# Patient Record
Sex: Female | Born: 1945
Health system: Southern US, Community
[De-identification: ages and names within clinical notes are randomized; demographics above are authoritative.]

## PROBLEM LIST (undated history)

## (undated) DIAGNOSIS — E119 Type 2 diabetes mellitus without complications: Secondary | ICD-10-CM

## (undated) DIAGNOSIS — M549 Dorsalgia, unspecified: Secondary | ICD-10-CM

## (undated) DIAGNOSIS — Q268 Other congenital malformations of great veins: Secondary | ICD-10-CM

## (undated) DIAGNOSIS — E7800 Pure hypercholesterolemia, unspecified: Secondary | ICD-10-CM

## (undated) DIAGNOSIS — J45909 Unspecified asthma, uncomplicated: Secondary | ICD-10-CM

## (undated) DIAGNOSIS — Q600 Renal agenesis, unilateral: Secondary | ICD-10-CM

## (undated) DIAGNOSIS — I2510 Atherosclerotic heart disease of native coronary artery without angina pectoris: Secondary | ICD-10-CM

## (undated) DIAGNOSIS — N189 Chronic kidney disease, unspecified: Secondary | ICD-10-CM

## (undated) DIAGNOSIS — K219 Gastro-esophageal reflux disease without esophagitis: Secondary | ICD-10-CM

## (undated) DIAGNOSIS — M2550 Pain in unspecified joint: Secondary | ICD-10-CM

## (undated) DIAGNOSIS — S43006A Unspecified dislocation of unspecified shoulder joint, initial encounter: Secondary | ICD-10-CM

## (undated) DIAGNOSIS — M797 Fibromyalgia: Secondary | ICD-10-CM

## (undated) DIAGNOSIS — E559 Vitamin D deficiency, unspecified: Secondary | ICD-10-CM

## (undated) DIAGNOSIS — I739 Peripheral vascular disease, unspecified: Secondary | ICD-10-CM

## (undated) DIAGNOSIS — IMO0002 Reserved for concepts with insufficient information to code with codable children: Secondary | ICD-10-CM

## (undated) DIAGNOSIS — Q5001 Congenital absence of ovary, unilateral: Secondary | ICD-10-CM

## (undated) DIAGNOSIS — F17200 Nicotine dependence, unspecified, uncomplicated: Secondary | ICD-10-CM

## (undated) DIAGNOSIS — M1990 Unspecified osteoarthritis, unspecified site: Secondary | ICD-10-CM

## (undated) DIAGNOSIS — R0602 Shortness of breath: Secondary | ICD-10-CM

## (undated) DIAGNOSIS — G4730 Sleep apnea, unspecified: Secondary | ICD-10-CM

## (undated) DIAGNOSIS — I82409 Acute embolism and thrombosis of unspecified deep veins of unspecified lower extremity: Secondary | ICD-10-CM

## (undated) DIAGNOSIS — I10 Essential (primary) hypertension: Secondary | ICD-10-CM

## (undated) HISTORY — DX: Atherosclerotic heart disease of native coronary artery without angina pectoris: I25.10

## (undated) HISTORY — PX: EYE SURGERY: SHX253

## (undated) HISTORY — PX: TUBAL LIGATION: SHX77

## (undated) HISTORY — PX: FOOT SURGERY: SHX648

## (undated) HISTORY — DX: Acute embolism and thrombosis of unspecified deep veins of unspecified lower extremity: I82.409

## (undated) HISTORY — PX: CATARACT EXTRACTION, BILATERAL: SHX1313

## (undated) HISTORY — DX: Reserved for concepts with insufficient information to code with codable children: IMO0002

## (undated) HISTORY — PX: OTHER SURGICAL HISTORY: SHX169

## (undated) HISTORY — DX: Type 2 diabetes mellitus without complications: E11.9

## (undated) HISTORY — DX: Other congenital malformations of great veins: Q26.8

## (undated) HISTORY — DX: Chronic kidney disease, unspecified: N18.9

## (undated) HISTORY — DX: Unspecified dislocation of unspecified shoulder joint, initial encounter: S43.006A

## (undated) HISTORY — PX: VEIN BYPASS SURGERY: SHX833

## (undated) HISTORY — DX: Unspecified osteoarthritis, unspecified site: M19.90

## (undated) HISTORY — DX: Shortness of breath: R06.02

## (undated) HISTORY — DX: Dorsalgia, unspecified: M54.9

## (undated) HISTORY — DX: Sleep apnea, unspecified: G47.30

## (undated) HISTORY — DX: Congenital absence of ovary, unilateral: Q50.01

## (undated) HISTORY — DX: Pure hypercholesterolemia, unspecified: E78.00

## (undated) HISTORY — DX: Renal agenesis, unilateral: Q60.0

## (undated) HISTORY — PX: IUD REMOVAL: SHX5392

## (undated) HISTORY — DX: Nicotine dependence, unspecified, uncomplicated: F17.200

## (undated) HISTORY — DX: Pain in unspecified joint: M25.50

## (undated) HISTORY — PX: APPENDECTOMY: SHX54

## (undated) HISTORY — DX: Peripheral vascular disease, unspecified: I73.9

## (undated) HISTORY — DX: Vitamin D deficiency, unspecified: E55.9

## (undated) HISTORY — PX: TOTAL ABDOMINAL HYSTERECTOMY: SHX209

## (undated) HISTORY — DX: Essential (primary) hypertension: I10

---
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
## 1970-09-05 HISTORY — PX: DIAGNOSTIC LAPAROSCOPY: SUR761

## 1973-09-05 HISTORY — PX: TUBAL LIGATION: SHX77

## 1979-09-06 HISTORY — PX: TOTAL ABDOMINAL HYSTERECTOMY: SHX209

## 2000-01-11 ENCOUNTER — Encounter: Admission: RE | Admit: 2000-01-11 | Discharge: 2000-01-11 | Payer: Self-pay | Admitting: *Deleted

## 2000-01-11 ENCOUNTER — Encounter: Payer: Self-pay | Admitting: *Deleted

## 2001-01-12 ENCOUNTER — Ambulatory Visit (HOSPITAL_COMMUNITY): Admission: RE | Admit: 2001-01-12 | Discharge: 2001-01-12 | Payer: Self-pay | Admitting: *Deleted

## 2001-06-01 ENCOUNTER — Other Ambulatory Visit: Admission: RE | Admit: 2001-06-01 | Discharge: 2001-06-01 | Payer: Self-pay | Admitting: *Deleted

## 2001-06-20 ENCOUNTER — Encounter: Admission: RE | Admit: 2001-06-20 | Discharge: 2001-06-20 | Payer: Self-pay | Admitting: *Deleted

## 2001-06-20 ENCOUNTER — Encounter: Payer: Self-pay | Admitting: *Deleted

## 2001-06-25 ENCOUNTER — Encounter: Admission: RE | Admit: 2001-06-25 | Discharge: 2001-09-23 | Payer: Self-pay | Admitting: *Deleted

## 2001-10-25 ENCOUNTER — Encounter: Payer: Self-pay | Admitting: *Deleted

## 2001-10-25 ENCOUNTER — Encounter: Admission: RE | Admit: 2001-10-25 | Discharge: 2001-10-25 | Payer: Self-pay | Admitting: *Deleted

## 2001-10-30 ENCOUNTER — Ambulatory Visit (HOSPITAL_COMMUNITY): Admission: RE | Admit: 2001-10-30 | Discharge: 2001-10-30 | Payer: Self-pay | Admitting: *Deleted

## 2001-10-31 ENCOUNTER — Ambulatory Visit (HOSPITAL_COMMUNITY): Admission: RE | Admit: 2001-10-31 | Discharge: 2001-10-31 | Payer: Self-pay | Admitting: *Deleted

## 2001-10-31 ENCOUNTER — Encounter: Payer: Self-pay | Admitting: *Deleted

## 2001-12-07 ENCOUNTER — Encounter: Payer: Self-pay | Admitting: *Deleted

## 2001-12-07 ENCOUNTER — Encounter: Admission: RE | Admit: 2001-12-07 | Discharge: 2001-12-07 | Payer: Self-pay | Admitting: *Deleted

## 2002-09-05 DIAGNOSIS — I2510 Atherosclerotic heart disease of native coronary artery without angina pectoris: Secondary | ICD-10-CM

## 2002-09-05 HISTORY — PX: ANGIOPLASTY: SHX39

## 2002-09-05 HISTORY — DX: Atherosclerotic heart disease of native coronary artery without angina pectoris: I25.10

## 2002-09-05 HISTORY — PX: CORONARY ANGIOPLASTY: SHX604

## 2003-03-07 ENCOUNTER — Ambulatory Visit (HOSPITAL_COMMUNITY): Admission: RE | Admit: 2003-03-07 | Discharge: 2003-03-07 | Payer: Self-pay | Admitting: Gastroenterology

## 2003-03-07 ENCOUNTER — Encounter (INDEPENDENT_AMBULATORY_CARE_PROVIDER_SITE_OTHER): Payer: Self-pay | Admitting: *Deleted

## 2003-05-19 ENCOUNTER — Encounter: Payer: Self-pay | Admitting: Emergency Medicine

## 2003-05-19 ENCOUNTER — Inpatient Hospital Stay (HOSPITAL_COMMUNITY): Admission: EM | Admit: 2003-05-19 | Discharge: 2003-05-21 | Payer: Self-pay | Admitting: Emergency Medicine

## 2003-06-16 ENCOUNTER — Encounter (HOSPITAL_COMMUNITY): Admission: RE | Admit: 2003-06-16 | Discharge: 2003-09-14 | Payer: Self-pay | Admitting: Cardiology

## 2003-09-15 ENCOUNTER — Encounter (HOSPITAL_COMMUNITY): Admission: RE | Admit: 2003-09-15 | Discharge: 2003-10-21 | Payer: Self-pay | Admitting: Cardiology

## 2003-10-28 ENCOUNTER — Encounter (HOSPITAL_COMMUNITY): Admission: RE | Admit: 2003-10-28 | Discharge: 2004-01-26 | Payer: Self-pay | Admitting: Cardiology

## 2003-12-10 ENCOUNTER — Encounter: Admission: RE | Admit: 2003-12-10 | Discharge: 2003-12-10 | Payer: Self-pay | Admitting: Internal Medicine

## 2004-01-01 ENCOUNTER — Encounter: Admission: RE | Admit: 2004-01-01 | Discharge: 2004-01-01 | Payer: Self-pay | Admitting: Cardiology

## 2004-02-04 ENCOUNTER — Encounter (HOSPITAL_COMMUNITY): Admission: RE | Admit: 2004-02-04 | Discharge: 2004-05-04 | Payer: Self-pay | Admitting: Cardiology

## 2004-05-06 ENCOUNTER — Encounter (HOSPITAL_COMMUNITY): Admission: RE | Admit: 2004-05-06 | Discharge: 2004-08-04 | Payer: Self-pay | Admitting: Cardiology

## 2004-08-05 ENCOUNTER — Encounter (HOSPITAL_COMMUNITY): Admission: RE | Admit: 2004-08-05 | Discharge: 2004-11-03 | Payer: Self-pay | Admitting: Cardiology

## 2004-11-18 ENCOUNTER — Encounter: Admission: RE | Admit: 2004-11-18 | Discharge: 2004-11-18 | Payer: Self-pay | Admitting: Cardiology

## 2004-12-16 ENCOUNTER — Encounter: Admission: RE | Admit: 2004-12-16 | Discharge: 2004-12-16 | Payer: Self-pay | Admitting: Cardiology

## 2005-03-22 ENCOUNTER — Encounter: Admission: RE | Admit: 2005-03-22 | Discharge: 2005-03-22 | Payer: Self-pay | Admitting: Family Medicine

## 2005-05-17 ENCOUNTER — Ambulatory Visit (HOSPITAL_BASED_OUTPATIENT_CLINIC_OR_DEPARTMENT_OTHER): Admission: RE | Admit: 2005-05-17 | Discharge: 2005-05-17 | Payer: Self-pay | Admitting: Otolaryngology

## 2005-05-29 ENCOUNTER — Ambulatory Visit: Payer: Self-pay | Admitting: Internal Medicine

## 2006-06-15 ENCOUNTER — Encounter: Admission: RE | Admit: 2006-06-15 | Discharge: 2006-06-15 | Payer: Self-pay | Admitting: Family Medicine

## 2006-10-09 ENCOUNTER — Encounter: Admission: RE | Admit: 2006-10-09 | Discharge: 2006-10-09 | Payer: Self-pay | Admitting: Family Medicine

## 2007-08-28 ENCOUNTER — Encounter: Admission: RE | Admit: 2007-08-28 | Discharge: 2007-08-28 | Payer: Self-pay | Admitting: Family Medicine

## 2008-09-02 ENCOUNTER — Encounter: Admission: RE | Admit: 2008-09-02 | Discharge: 2008-09-02 | Payer: Self-pay | Admitting: Family Medicine

## 2009-11-30 ENCOUNTER — Encounter: Admission: RE | Admit: 2009-11-30 | Discharge: 2009-11-30 | Payer: Self-pay | Admitting: Family Medicine

## 2010-06-22 ENCOUNTER — Ambulatory Visit: Payer: Self-pay | Admitting: Cardiology

## 2010-09-25 ENCOUNTER — Encounter: Payer: Self-pay | Admitting: Diagnostic Radiology

## 2010-09-26 ENCOUNTER — Encounter: Payer: Self-pay | Admitting: Cardiology

## 2010-09-26 ENCOUNTER — Encounter: Payer: Self-pay | Admitting: Interventional Radiology

## 2010-11-23 ENCOUNTER — Other Ambulatory Visit: Payer: Self-pay | Admitting: Family Medicine

## 2010-11-23 DIAGNOSIS — Z1231 Encounter for screening mammogram for malignant neoplasm of breast: Secondary | ICD-10-CM

## 2010-12-02 ENCOUNTER — Ambulatory Visit
Admission: RE | Admit: 2010-12-02 | Discharge: 2010-12-02 | Disposition: A | Discharge status: Home / Self Care | Payer: Medicare Other | Source: Ambulatory Visit | Attending: Family Medicine | Admitting: Family Medicine

## 2010-12-02 DIAGNOSIS — Z1231 Encounter for screening mammogram for malignant neoplasm of breast: Secondary | ICD-10-CM

## 2010-12-14 ENCOUNTER — Other Ambulatory Visit: Payer: Self-pay | Admitting: Orthopedic Surgery

## 2010-12-14 DIAGNOSIS — R52 Pain, unspecified: Secondary | ICD-10-CM

## 2010-12-15 ENCOUNTER — Ambulatory Visit
Admission: RE | Admit: 2010-12-15 | Discharge: 2010-12-15 | Disposition: A | Discharge status: Home / Self Care | Payer: Medicare Other | Source: Ambulatory Visit | Attending: Orthopedic Surgery | Admitting: Orthopedic Surgery

## 2010-12-15 DIAGNOSIS — R52 Pain, unspecified: Secondary | ICD-10-CM

## 2010-12-24 ENCOUNTER — Other Ambulatory Visit: Payer: Self-pay | Admitting: *Deleted

## 2010-12-24 DIAGNOSIS — E7800 Pure hypercholesterolemia, unspecified: Secondary | ICD-10-CM

## 2010-12-25 ENCOUNTER — Encounter: Payer: Self-pay | Admitting: Cardiology

## 2010-12-25 DIAGNOSIS — I2510 Atherosclerotic heart disease of native coronary artery without angina pectoris: Secondary | ICD-10-CM | POA: Insufficient documentation

## 2010-12-25 DIAGNOSIS — F17200 Nicotine dependence, unspecified, uncomplicated: Secondary | ICD-10-CM | POA: Insufficient documentation

## 2010-12-25 DIAGNOSIS — G4730 Sleep apnea, unspecified: Secondary | ICD-10-CM | POA: Insufficient documentation

## 2010-12-25 DIAGNOSIS — Q600 Renal agenesis, unilateral: Secondary | ICD-10-CM | POA: Insufficient documentation

## 2010-12-25 DIAGNOSIS — E7849 Other hyperlipidemia: Secondary | ICD-10-CM | POA: Insufficient documentation

## 2010-12-29 ENCOUNTER — Ambulatory Visit (INDEPENDENT_AMBULATORY_CARE_PROVIDER_SITE_OTHER): Payer: Medicare Other | Admitting: Cardiology

## 2010-12-29 ENCOUNTER — Other Ambulatory Visit (INDEPENDENT_AMBULATORY_CARE_PROVIDER_SITE_OTHER): Payer: Medicare Other | Admitting: *Deleted

## 2010-12-29 ENCOUNTER — Encounter: Payer: Self-pay | Admitting: Cardiology

## 2010-12-29 DIAGNOSIS — E7800 Pure hypercholesterolemia, unspecified: Secondary | ICD-10-CM

## 2010-12-29 DIAGNOSIS — F17200 Nicotine dependence, unspecified, uncomplicated: Secondary | ICD-10-CM

## 2010-12-29 DIAGNOSIS — I2510 Atherosclerotic heart disease of native coronary artery without angina pectoris: Secondary | ICD-10-CM

## 2010-12-29 LAB — HEPATIC FUNCTION PANEL
Albumin: 3.8 g/dL (ref 3.5–5.2)
Bilirubin, Direct: 0.1 mg/dL (ref 0.0–0.3)
Total Bilirubin: 0.7 mg/dL (ref 0.3–1.2)

## 2010-12-29 LAB — BASIC METABOLIC PANEL
CO2: 29 mEq/L (ref 19–32)
Creatinine, Ser: 0.9 mg/dL (ref 0.4–1.2)
GFR: 70.86 mL/min (ref >60.00)
Glucose, Bld: 99 mg/dL (ref 70–99)
Potassium: 4.1 mEq/L (ref 3.5–5.1)
Sodium: 143 mEq/L (ref 135–145)

## 2010-12-29 LAB — LIPID PANEL
LDL Cholesterol: 120 mg/dL — ABNORMAL HIGH (ref 0–99)
Triglycerides: 155 mg/dL — ABNORMAL HIGH (ref 0.0–149.0)
VLDL: 31 mg/dL (ref 0.0–40.0)

## 2010-12-29 NOTE — Assessment & Plan Note (Signed)
She has not been compliant with her statin therapy. We will followup on her blood work today. Her goal LDL is 70. I am encouraged with her weight loss.

## 2010-12-29 NOTE — Assessment & Plan Note (Signed)
She is asymptomatic. Her last stress Cardiolite study was in March of 2010 and was normal. We will continue risk factor modification.

## 2010-12-29 NOTE — Progress Notes (Signed)
   Sandra [name] Date of Birth: 06-02-1946   History of Present Illness: Sandra [name] is seen for followup today. She has been doing very well from a cardiac standpoint without any symptoms of chest pain, shortness of breath, or palpitations. She does note one episode where she became very dizzy while lying in bed. This resolved and she has had no further problems. She is exercising regularly with the help of a personal trainer. She does continue to smoke but realizes she needs to quit.  Current Outpatient Prescriptions on File Prior to Visit  Medication Sig Dispense Refill  . aspirin 325 MG tablet Take 325 mg by mouth daily.        Marland Kitchen CALCIUM PO Take by mouth daily.        Marland Kitchen Fexofenadine HCl (ALLEGRA PO) Take by mouth daily.        Marland Kitchen guaiFENesin (MUCINEX) 600 MG 12 hr tablet Take 600 mg by mouth daily.        . metoprolol (TOPROL-XL) 50 MG 24 hr tablet Take 25 mg by mouth daily.        . Multiple Vitamin (MULTIVITAMIN) tablet Take 1 tablet by mouth daily.        . rosuvastatin (CRESTOR) 5 MG tablet Take 5 mg by mouth daily.        Marland Kitchen VITAMIN D, CHOLECALCIFEROL, PO Take 3,000 mg by mouth daily.        . Cetirizine HCl (ZYRTEC PO) Take by mouth daily.          Allergies  Allergen Reactions  . Erythromycin     Past Medical History  Diagnosis Date  . Coronary artery disease 2004    STATUS POST STENTING OF THE RIGHT CORONARY OSTIUM   . Hypercholesterolemia   . Tobacco dependence   . Sleep apnea   . Congenital absence of left kidney   . Dislocated shoulder     left    Past Surgical History  Procedure Date  . Total abdominal hysterectomy   . Bone spur shoulder   . Tubal ligation     History  Smoking status  . Current Everyday Smoker -- 1.0 packs/day  Smokeless tobacco  . Not on file    History  Alcohol Use No    Family History  Problem Relation Age of Onset  . Leukemia Sister   . Hypertension Brother     Review of Systems: The review of systems is positive for she  had a recent fall in her yard and dislocated her left shoulder. She is scheduled to see orthopedic surgery tomorrow.  All other systems were reviewed and are negative.  Physical Exam: BP 140/82  Pulse 82  Ht 5\' 3"  (1.6 m)  Wt 171 lb 9.6 oz (77.837 kg)  BMI 30.40 kg/m2 He is an obese white female in no acute distress. HEENT exam is unremarkable. She has no JVD, adenopathy, thyromegaly, or bruits. Lungs are clear. Cardiac exam reveals a regular rate and rhythm without gallop, murmur, or click. Abdomen is obese, soft, nontender. She has no masses or bruits. Extremities are without edema. Pedal pulses are palpable. She is alert and oriented x3. Cranial nerves II through XII are intact. LABORATORY DATA:   Assessment / Plan:

## 2010-12-29 NOTE — Patient Instructions (Signed)
We will call with the results of your blood work today.  Continue your current medications.  Continue your efforts at weight loss and exercise.  We will see you back in 6 months.  Stop smoking!!

## 2010-12-29 NOTE — Assessment & Plan Note (Signed)
We discussed smoking cessation strategies. She  is interested in using Zyban when she decides to quit. She is to call as when she is ready to fill his prescription and we will send it in to the pharmacy.

## 2010-12-30 ENCOUNTER — Telehealth: Payer: Self-pay | Admitting: *Deleted

## 2010-12-30 ENCOUNTER — Other Ambulatory Visit: Payer: Self-pay | Admitting: *Deleted

## 2010-12-30 DIAGNOSIS — E7800 Pure hypercholesterolemia, unspecified: Secondary | ICD-10-CM

## 2010-12-30 NOTE — Telephone Encounter (Signed)
Notified of lab results. States she has not been taking Crestor 5 mg regularly. Has not taken in about 2 wks. Per Dr. Swaziland advises to get back on Crestor regularly. Will recheck labs in 3 mo. Also advised to watch diet closer.

## 2010-12-30 NOTE — Telephone Encounter (Signed)
Message copied by Murrell Redden on Thu Dec 30, 2010  2:50 PM ------      Message from: Sw[place]and, PETER      Created: Wed Dec 29, 2010  7:30 PM       Chemistries are normal. LDL not at goal <70. Would recommend she go back on statin. If she cant tolerate statin could try Zetia.

## 2011-01-21 NOTE — Discharge Summary (Signed)
NAMESURAYA, [name]                          ACCOUNT NO.:  [id number]   MEDICAL RECORD NO.:  [id number]                   PATIENT TYPE:  INP   LOCATION:  6531                                 FACILITY:  [facility]   PHYSICIAN:  Peter M. Swaziland, M.D.               DATE OF BIRTH:  1946-05-13   DATE OF ADMISSION:  05/19/2003  DATE OF DISCHARGE:  05/21/2003                                 DISCHARGE SUMMARY   HISTORY OF PRESENT ILLNESS:  This is a 65 year old white female with a  history of tobacco abuse and hypercholesterolemia who presented with  symptoms of classic unstable angina. She described mid substernal chest pain  pressure associated with nausea, clammy sweats, shortness of breath. Her  pain radiated into her neck and jaw bilaterally and into both shoulders.  Symptoms were occurring at rest and lasting up to 30 minutes at a time. She  was admitted for further evaluation. For details of the past medical  history, social history, family history, and physical exam, please see  admission history and physical.   LABORATORY DATA:  A pH is 7.39, pCO2 of 42, bicarb 25. White count 9,000,  hemoglobin 13, hematocrit 38.8, platelets 185,000. Coags were normal. Sodium  141, potassium 3.9, chloride 109, CO2 27, BUN 12, creatinine 0.9, glucose of  91. Initial CK was 92 with 2.3 MB. Troponin was 0.08. Repeat CPK was  identical as was the troponin. Cholesterol was 186, triglycerides 178, LDL  of 108, HDL of 42. Point of care cardiac enzyme testing was negative x3.  Initial ECG showed normal sinus rhythm with normal ECG. Chest x-ray showed  no active disease.   HOSPITAL COURSE:  The patient was admitted to telemetry. She had mildly  positive troponins as noted. Her ECG remained stable. She was treated with  aspirin, subcu Lovenox, and beta blocker. She had no recurrent chest pain.  She was counseled on smoking cessation. She was begun on statin therapy. On  May 20, 2003, she underwent  cardiac catheterization. This demonstrated  normal appearing left coronary artery. The right coronary artery had a 99%  ostial stenosis. Left ventricular function was normal with an ejection  fraction of 60%. The patient underwent intervention of the ostial right  coronary lesion, initially dilating with Cutting balloon, then deploying a  2.5 x 13 mm Cypher drug-eluding stent. This was post dilated to 16  atmospheres with an excellent angiographic result, 0% residual stenosis, and  TIMI grade III flow. The patient was treated during and post procedure with  IV Integrilin. She was begun on aspirin, Plavix, continued on beta blockade.  She did well without any recurrent chest pain. Her ECG remained stable. Her  blood counts remained normal. She was discharged home the following day in  stable condition.   DISCHARGE DIAGNOSES:  1. Unstable angina pectoris.  2. Single-vessel obstructive atherosclerotic coronary artery disease     involving  the ostium of the right coronary artery. Status post successful     stenting.  3. Hypercholesterolemia.  4. Tobacco abuse.   DISCHARGE MEDICATIONS:  1. Coated aspirin 325 mg daily.  2. Plavix 75 mg daily.  3. Toprol-XL 50 mg per day.  4. Lipitor 10 mg per day.  5. Wellbutrin XR 150 mg per day.  6. Nitroglycerin 1/150 sublingual p.r.n.   ACTIVITY:  The patient is to avoid lifting or straining for the next five  days. She was to gradually increase her walking and to follow a low fat  diet. I counseled her on smoking cessation.   FOLLOW UP:  She will follow with Dr. Swaziland in two weeks.   DISCHARGE STATUS:  Improved.                                                Peter M. Swaziland, M.D.    PMJ/MEDQ  D:  05/21/2003  T:  05/21/2003  Job:  [id number]   cc:   Wilson Singer, M.D.  [address]  [place]  [place] [address]  Fax: [phone]

## 2011-01-21 NOTE — H&P (Signed)
NAME:  Sandra [name], Sandra [name]                          ACCOUNT NO.:  [id number]   MEDICAL RECORD NO.:  [id number]                   PATIENT TYPE:  EMS   LOCATION:  MAJO                                 FACILITY:  [facility]   PHYSICIAN:  Peter M. Swaziland, M.D.               DATE OF BIRTH:  July 28, 1946   DATE OF ADMISSION:  05/19/2003  DATE OF DISCHARGE:                                HISTORY & PHYSICAL   HISTORY OF PRESENT ILLNESS:  The patient is a 65 year old white female with  a history of tobacco abuse, hypercholesterolemia who presents with  complaints of chest pain.  She states she began having episodes of chest  pain Thursday. This was not related to exertion, but she has been under some  increased stress recently. She describes her pain as a substernal chest  pressure, associated with nausea, clammy sweats, and shortness of breath.  The pain radiates up into her neck and jaw bilaterally and feels like a  toothache.  She has also had radiation of pain into both shoulders as an  ache.  The pain has increased in frequency and severity since Thursday with  some episodes lasting up to 30 minutes. She is currently pain-free.   PAST MEDICAL HISTORY:  Significant for hypercholesterolemia.  She has had a  previous hysterectomy.  She has congenital absence of her left kidney. She  has a history of superficial thrombophlebitis.   MEDICATIONS:  Multivitamin daily and vitamin E daily.  She had previously  been taking Lipitor and Premarin, but has been off of these for several  months.   ALLERGIES:  ERYTHROMYCIN.   FAMILY HISTORY:  Father died of heart disease and renal disease.  Mother is  alive and well.  One brother has hypertension. One sister died with  leukemia.   SOCIAL HISTORY:  The patient works for [employer], Investment banker, corporate work. She smokes one pack per day.  She is married  and has two children.   REVIEW OF SYSTEMS:  Otherwise negative.   PHYSICAL  EXAMINATION:  GENERAL: The patient is a pleasant white female in no  acute distress.  VITAL SIGNS:  Blood pressure 160/84, pulse 70, respirations 20.  She is  afebrile.  HEENT:  Pupils equal, round, and reactive to light and accommodation.  Extraocular movements are full.  Oropharynx is clear.  NECK:  Supple without JVD, adenopathy, thyromegaly, or bruits.  LUNGS:  Clear to auscultation and percussion.  HEART:  Regular rate and rhythm.  Normal S1 and S2 without murmurs, rubs, or  gallops or clicks.  ABDOMEN:  Soft and nontender.  Bowel sounds are positive.  No  hepatosplenomegaly, masses, or bruits.  EXTREMITIES:  Without edema.  Pulses are 2+ and symmetric. There is no  evidence of phlebitis.  NEUROLOGY:  Intact.   LABORATORY DATA:  Initial EKG is normal.  Chest x-ray is normal.  Initial  point of care cardiac enzymes are normal.   IMPRESSION:  1. Unstable angina pectoris with a classic history.  2. Tobacco abuse.  3. Hypercholesterolemia.   PLAN:  The patient will be admitted and ruled out for myocardial infarction.  She will be treated with aspirin, subcu Lovenox, and started on oral beta  blockade.  Reassess her lipid status.  Will plan on cardiac catheterization  in the morning.                                                Peter M. Swaziland, M.D.    PMJ/MEDQ  D:  05/19/2003  T:  05/19/2003  Job:  [id number]   cc:   Wilson Singer, M.D.  [address]  [place]  [place] [address]  Fax: [phone]

## 2011-01-21 NOTE — Procedures (Signed)
NAMEAELYN, STANALAND                ACCOUNT NO.:  [id number]   MEDICAL RECORD NO.:  [id number]          PATIENT TYPE:  OUT   LOCATION:  SLEEP CENTER                 FACILITY:  [facility]   PHYSICIAN:  Clinton D. Maple Hudson, M.D. DATE OF BIRTH:  1945-09-27   DATE OF STUDY:  05/17/2005                              NOCTURNAL POLYSOMNOGRAM   REFERRING PHYSICIAN:  Dr. Flo Shanks.   DATE OF STUDY:  May 17, 2005.   INDICATION FOR STUDY:  Hypersomnia with sleep apnea. Epworth sleepiness  score 13/24, BMI 30. Weight is 175 pounds.   SLEEP ARCHITECTURE:  Total sleep time 352 minutes with sleep efficiency 89%.  Stage I was 6%, stage II 62%, stages III and IV 17%, REM 15% of total sleep  time. Sleep latency 16 minutes, REM latency 57 minutes, awake after sleep  onset 25 minutes, arousal index increased at 15 per hour. No bedtime  medication was taken.   RESPIRATORY DATA:  Apnea/hypopnea index (AHI, RDI) 27.6 obstructive events  per hour indicating moderate obstructive sleep apnea/hypopnea syndrome. This  included 42 obstructive apneas and 120 hypopneas. Events were not positional  but more were noted while she was sleeping on her left side later at night.  REM AHI of 72.7 per hour reflecting significant predominance of apneas  during REM which occurred after 4 a.m. There was insufficient sleep  disordered breathing in the first few hours at night to permit CPAP  titration by split protocol.   OXYGEN DATA:  Moderate snoring with oxygen desaturation to a nadir of 80%.  Mean oxygen saturation through the study was 91% on room air.   CARDIAC DATA:  Normal sinus rhythm.   MOVEMENT/PARASOMNIA:  A total 161 limb jerks were reported which 32 were  associated with arousal or awakening for periodic limb movement with arousal  index of 5.4 per hour which is mildly increased.   IMPRESSION/RECOMMENDATIONS:  1.  Moderate obstructive sleep apnea/hypopnea syndrome, apnea/hypopnea index      of 27.6 per  hour with moderate snoring and oxygen desaturation to 81%.  2.  Events were not positional but strongly associated with REM which      developed quite late in the study night.      Consider return for CPAP titration or evaluate for alternative therapies      as appropriate.  3.  Periodic limb movement with arousal, 5.4 per hour.      Clinton D. Maple Hudson, M.D.  Diplomate, Biomedical engineer of Sleep Medicine  Electronically Signed     CDY/MEDQ  D:  05/29/2005 08:40:43  T:  05/29/2005 14:12:10  Job:  [id number]

## 2011-03-26 ENCOUNTER — Other Ambulatory Visit: Payer: Self-pay | Admitting: Cardiology

## 2011-03-28 NOTE — Telephone Encounter (Signed)
escribe medication per fax request  

## 2011-07-12 ENCOUNTER — Encounter: Payer: Self-pay | Admitting: Cardiology

## 2011-07-14 ENCOUNTER — Telehealth: Payer: Self-pay | Admitting: Cardiology

## 2011-07-14 NOTE — Telephone Encounter (Signed)
New Problem: Per [place] Dec calling aware that Dr. Swaziland is off today.  Please call to discuss care of patient to [facility] cell phone is noted

## 2011-07-14 NOTE — Telephone Encounter (Signed)
Sandra [name] called from [facility] orth center. He is a PA. States 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 They are referring to Vascular surgeon but wanted Dr. Swaziland to be aware. Will fax notes to [name].

## 2011-07-15 ENCOUNTER — Encounter: Payer: Self-pay | Admitting: Vascular Surgery

## 2011-08-02 ENCOUNTER — Encounter: Payer: Self-pay | Admitting: Vascular Surgery

## 2011-08-03 ENCOUNTER — Ambulatory Visit (INDEPENDENT_AMBULATORY_CARE_PROVIDER_SITE_OTHER): Payer: Medicare Other | Admitting: Vascular Surgery

## 2011-08-03 ENCOUNTER — Other Ambulatory Visit: Payer: Medicare Other

## 2011-08-03 ENCOUNTER — Other Ambulatory Visit (INDEPENDENT_AMBULATORY_CARE_PROVIDER_SITE_OTHER): Payer: Medicare Other

## 2011-08-03 ENCOUNTER — Encounter: Payer: Self-pay | Admitting: Vascular Surgery

## 2011-08-03 VITALS — BP 165/77 | HR 88 | Resp 16 | Ht 63.0 in | Wt 182.0 lb

## 2011-08-03 DIAGNOSIS — I6529 Occlusion and stenosis of unspecified carotid artery: Secondary | ICD-10-CM

## 2011-08-03 DIAGNOSIS — I82220 Acute embolism and thrombosis of inferior vena cava: Secondary | ICD-10-CM

## 2011-08-03 NOTE — Progress Notes (Signed)
[facility] [place]  Patient name: Sandra [name] MRN: [id number] DOB: [birth year] Sex: female  CC: absence of inferior vena cava. Referred by Dr. Sherlean Foot  HPI: Sandra [name] is a 65 y.o. female who was undergoing a workup for chronic low back pain. This prompted an MRI of the lumbar spine which was done at [facility] radiology. This showed evidence of degenerative lumbar spondylosis with multilevel disc disease and facet disease. 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 He was sent for vascular consultation.  The patient does describe some aching pain in her legs which is aggravated by prolonged standing and sitting. She also states that she has restless leg syndrome. He's also had varicose veins for many years. She's had no history of DVT. She did have one episode of superficial thrombophlebitis in the past. She is not notes significant problems with swelling in her legs in general.  Of note she has had multiple other anomalies noted she states. She is missing a kidney. She has only one ovary.  Past Medical History  Diagnosis Date  . Coronary artery disease 2004    STATUS POST STENTING OF THE RIGHT CORONARY OSTIUM   . Hypercholesterolemia   . Tobacco dependence   . Sleep apnea   . Congenital absence of left kidney   . Dislocated shoulder     left  . DDD (degenerative disc disease)     Family History  Problem Relation Age of Onset  . Leukemia Sister   . Hypertension Brother     SOCIAL HISTORY: History  Substance Use Topics  . Smoking status: Current Everyday Smoker -- 1.0 packs/day  . Smokeless tobacco: Not on file  . Alcohol Use: No    Allergies  Allergen Reactions  . Erythromycin     Current Outpatient Prescriptions  Medication Sig Dispense Refill  . aspirin 325 MG tablet Take 325 mg by mouth daily.        Marland Kitchen CALCIUM PO Take by mouth daily.        . CRESTOR 5 MG tablet TAKE 1 TABLET EVERY DAY  30  tablet  5  . guaiFENesin (MUCINEX) 600 MG 12 hr tablet Take 600 mg by mouth daily.        Marland Kitchen ibuprofen (ADVIL,MOTRIN) 100 MG tablet Take 100 mg by mouth every 6 (six) hours as needed.        . metoprolol (TOPROL-XL) 50 MG 24 hr tablet Take 25 mg by mouth daily.        . Multiple Vitamin (MULTIVITAMIN) tablet Take 1 tablet by mouth daily.        . Turmeric 500 MG CAPS Take 500 mg by mouth 2 (two) times daily as needed.        Marland Kitchen VITAMIN D, CHOLECALCIFEROL, PO Take 3,000 mg by mouth daily.        . Cetirizine HCl (ZYRTEC PO) Take by mouth daily.        Marland Kitchen Fexofenadine HCl (ALLEGRA PO) Take by mouth daily.          REVIEW OF SYSTEMS: Arly.Keller ] denotes positive finding; [  ] denotes negative finding CARDIOVASCULAR:  [ ]  chest pain   [ ]  chest pressure   [ ]  palpitations   [ ]  orthopnea   Arly.Keller ] dyspnea on exertion   [ ]  claudication   [ ]  rest pain   [ ]  DVT   [ ]  phlebitis PULMONARY:   Arly.Keller ]  productive cough   [ ]  asthma   [ ]  wheezing NEUROLOGIC:   [ ]  weakness  [ ]  paresthesias  [ ]  aphasia  [ ]  amaurosis  [ ]  dizziness HEMATOLOGIC:   [ ]  bleeding problems   [ ]  clotting disorders MUSCULOSKELETAL:  [ ]  joint pain   [ ]  joint swelling [ ]  leg swelling GASTROINTESTINAL: [ ]   blood in stool  [ ]   hematemesis GENITOURINARY:  [ ]   dysuria  [ ]   hematuria PSYCHIATRIC:  [ ]  history of major depression INTEGUMENTARY:  [ ]  rashes  [ ]  ulcers CONSTITUTIONAL:  [ ]  fever   [ ]  chills  PHYSICAL EXAM: Filed Vitals:   08/03/11 1005  BP: 165/77  Pulse: 88  Resp: 16  Height: 5\' 3"  (1.6 m)  Weight: 182 lb (82.555 kg)  SpO2: 94%   Body mass index is 32.24 kg/(m^2). GENERAL: The patient is a well-nourished female, in no acute distress. The vital signs are documented above. CARDIOVASCULAR: There is a regular rate and rhythm without significant murmur appreciated. She has a right carotid bruit. She has palpable femoral pulses and palpable dorsalis pedis pulses bilaterally. His mild bilateral artery  swelling. PULMONARY: There is good air exchange bilaterally without wheezing or rales. ABDOMEN: Soft and non-tender with normal pitched bowel sounds. I cannot palpate an abdominal aortic aneurysm. MUSCULOSKELETAL: There are no major deformities or cyanosis. NEUROLOGIC: No focal weakness or paresthesias are detected. SKIN: There are no ulcers or rashes noted. She does have varicose veins bilaterally. PSYCHIATRIC: The patient has a normal affect.  DATA:  1. Given that she has a right carotid bruit I did obtain a carotid duplex scan today. This shows no evidence of significant carotid disease. 2. Did review her MRI of the spine and agreed that she appears to have absence of the IVC. His multiple large collaterals. This is clearly a chronic finding.  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 I think this does put her at slightly increased risk for DVT in the future although she's had no history of DVT up until now. This reason I would not recommend Coumadin for chronic anticoagulation given the risk of bleeding. Have discussed the importance of DVT prophylaxis. Clearly if she had any major surgery this would be important consideration. Also discussed the importance of remaining active especially when she is traveling. He is also considering having work done on her varicose veins and I would think she would be at high risk for recurrence because of this. I'll be happy to see her back at any time if any new vascular issues arise.  Shawnya Mayor S [facility] [place] Office: [phone] ;

## 2011-08-10 NOTE — Procedures (Unsigned)
CAROTID DUPLEX EXAM  INDICATION:  Right carotid bruit on physical exam.  HISTORY: Diabetes:  No. Cardiac:  Previous PTCA. Hypertension:  Yes. Smoking:  Currently. Previous Surgery: CV History: Amaurosis Fugax No, Paresthesias No, Hemiparesis No.                                      RIGHT             LEFT Brachial systolic pressure:         178               180 Brachial Doppler waveforms:         Triphasic         Triphasic Vertebral direction of flow:        Antegrade         Antegrade DUPLEX VELOCITIES (cm/sec) CCA peak systolic                   101               74 ECA peak systolic                   164               110 ICA peak systolic                   106               80 ICA end diastolic                   36                30 PLAQUE MORPHOLOGY:                  Inhomogeneous     Inhomogeneous PLAQUE AMOUNT:                      Mild              Mild PLAQUE LOCATION:                    CCA, ICA, ECA     CCA, ICA.  DUPLEX IMAGING:  There is a low bifurcation noted on the left with a very tortuous internal carotid artery.  IMPRESSION: 1. 20% to 39% bilateral internal carotid artery stenosis. 2. Mild right external carotid artery stenosis.  ___________________________________________ Di Kindle. Edilia Bo, M.D.  CI/MEDQ  D:  08/03/2011  T:  08/03/2011  Job:  [id number]

## 2011-09-06 DIAGNOSIS — Q268 Other congenital malformations of great veins: Secondary | ICD-10-CM

## 2011-09-06 HISTORY — DX: Other congenital malformations of great veins: Q26.8

## 2011-10-13 ENCOUNTER — Encounter: Payer: Self-pay | Admitting: Cardiology

## 2011-10-13 ENCOUNTER — Ambulatory Visit (INDEPENDENT_AMBULATORY_CARE_PROVIDER_SITE_OTHER): Payer: Medicare Other | Admitting: Cardiology

## 2011-10-13 VITALS — BP 158/82 | HR 80 | Ht 63.0 in | Wt 185.6 lb

## 2011-10-13 DIAGNOSIS — E785 Hyperlipidemia, unspecified: Secondary | ICD-10-CM

## 2011-10-13 DIAGNOSIS — Z720 Tobacco use: Secondary | ICD-10-CM

## 2011-10-13 DIAGNOSIS — F17200 Nicotine dependence, unspecified, uncomplicated: Secondary | ICD-10-CM

## 2011-10-13 DIAGNOSIS — I2510 Atherosclerotic heart disease of native coronary artery without angina pectoris: Secondary | ICD-10-CM

## 2011-10-13 DIAGNOSIS — I871 Compression of vein: Secondary | ICD-10-CM | POA: Insufficient documentation

## 2011-10-13 DIAGNOSIS — I10 Essential (primary) hypertension: Secondary | ICD-10-CM

## 2011-10-13 NOTE — Progress Notes (Signed)
   Sandra [name] Date of Birth: [birth year]   History of Present Illness: Sandra [name] is seen for followup today. She has been doing very well from a cardiac standpoint. She has had very rare fleeting symptoms of chest tightness. She does note symptoms of shortness of breath and is now ready to quit smoking. 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 She was evaluated by Dr. Edilia Bo in the vascular surgery office and no further treatment or workup was indicated. It is noteworthy that she also has congenital absence of her left kidney and left ovary and fallopian tube.  Current Outpatient Prescriptions on File Prior to Visit  Medication Sig Dispense Refill  . aspirin 325 MG tablet Take 325 mg by mouth daily.        Marland Kitchen CALCIUM PO Take by mouth daily.        . CRESTOR 5 MG tablet TAKE 1 TABLET EVERY DAY  30 tablet  5  . guaiFENesin (MUCINEX) 600 MG 12 hr tablet Take 600 mg by mouth daily.        Marland Kitchen ibuprofen (ADVIL,MOTRIN) 100 MG tablet Take 100 mg by mouth every 6 (six) hours as needed.        . metoprolol (TOPROL-XL) 50 MG 24 hr tablet Take 25 mg by mouth daily.       . Multiple Vitamin (MULTIVITAMIN) tablet Take 1 tablet by mouth daily.        . Turmeric 500 MG CAPS Take 500 mg by mouth 2 (two) times daily as needed.        Marland Kitchen VITAMIN D, CHOLECALCIFEROL, PO Take 3,000 mg by mouth daily.          Allergies  Allergen Reactions  . Erythromycin   . Epinephrine Palpitations    Past Medical History  Diagnosis Date  . Coronary artery disease 2004    STATUS POST STENTING OF THE RIGHT CORONARY OSTIUM   . Hypercholesterolemia   . Tobacco dependence   . Sleep apnea   . Congenital absence of left kidney   . Dislocated shoulder     left  . DDD (degenerative disc disease)     Past Surgical History  Procedure Date  . Total abdominal hysterectomy   . Bone spur shoulder   . Tubal ligation     History  Smoking status  . Current Everyday Smoker -- 1.0 packs/day    Smokeless tobacco  . Not on file    History  Alcohol Use No    Family History  Problem Relation Age of Onset  . Leukemia Sister   . Hypertension Brother     Review of Systems: As noted in history of present illness.  All other systems were reviewed and are negative.  Physical Exam: BP 158/82  Pulse 80  Ht 5\' 3"  (1.6 m)  Wt 185 lb 9.6 oz (84.188 kg)  BMI 32.88 kg/m2 He is an obese white female in no acute distress. HEENT exam is unremarkable. She has no JVD, adenopathy, thyromegaly, or bruits. Lungs are clear. Cardiac exam reveals a regular rate and rhythm without gallop, murmur, or click. Abdomen is obese, soft, nontender. She has no masses or bruits. Extremities are without edema. Pedal pulses are palpable. She is alert and oriented x3. Cranial nerves II through XII are intact. LABORATORY DATA: ECG today demonstrates normal sinus rhythm with a normal ECG.  Assessment / Plan:

## 2011-10-13 NOTE — Assessment & Plan Note (Signed)
I suspect this is congenital based on her other congenital abnormalities. She has well-developed collaterals. I don't anticipate that this will cause her any difficulty in the future.

## 2011-10-13 NOTE — Patient Instructions (Signed)
We will send in a Rx for Chantix to help you stop smoking  We will schedule you for fasting lab work.  I will see you again in 6 months.

## 2011-10-13 NOTE — Assessment & Plan Note (Signed)
She is having no significant anginal symptoms. Her last stress test in March of 2010 was normal. We will continue with her risk factor modification and followup again in 6 months.

## 2011-10-13 NOTE — Assessment & Plan Note (Signed)
She is ready to quit smoking. We will call her in a prescription for Chantix. She has taken this before with good success.

## 2011-10-14 ENCOUNTER — Other Ambulatory Visit (INDEPENDENT_AMBULATORY_CARE_PROVIDER_SITE_OTHER): Payer: Medicare Other | Admitting: *Deleted

## 2011-10-14 DIAGNOSIS — Z720 Tobacco use: Secondary | ICD-10-CM

## 2011-10-14 DIAGNOSIS — F17200 Nicotine dependence, unspecified, uncomplicated: Secondary | ICD-10-CM

## 2011-10-14 DIAGNOSIS — E785 Hyperlipidemia, unspecified: Secondary | ICD-10-CM

## 2011-10-14 DIAGNOSIS — I2510 Atherosclerotic heart disease of native coronary artery without angina pectoris: Secondary | ICD-10-CM

## 2011-10-14 DIAGNOSIS — I10 Essential (primary) hypertension: Secondary | ICD-10-CM

## 2011-10-14 LAB — LIPID PANEL
Cholesterol: 152 mg/dL (ref 0–200)
HDL: 43.9 mg/dL (ref >39.00)
Total CHOL/HDL Ratio: 3
Triglycerides: 108 mg/dL (ref 0.0–149.0)

## 2011-10-14 LAB — HEPATIC FUNCTION PANEL
ALT: 25 U/L (ref 0–35)
Bilirubin, Direct: 0 mg/dL (ref 0.0–0.3)
Total Bilirubin: 0.6 mg/dL (ref 0.3–1.2)
Total Protein: 6.8 g/dL (ref 6.0–8.3)

## 2011-10-14 LAB — BASIC METABOLIC PANEL
BUN: 16 mg/dL (ref 6–23)
Calcium: 8.9 mg/dL (ref 8.4–10.5)
Creatinine, Ser: 0.8 mg/dL (ref 0.4–1.2)
Potassium: 4.2 mEq/L (ref 3.5–5.1)

## 2011-12-01 ENCOUNTER — Other Ambulatory Visit: Payer: Self-pay | Admitting: Family Medicine

## 2011-12-01 DIAGNOSIS — Z1231 Encounter for screening mammogram for malignant neoplasm of breast: Secondary | ICD-10-CM

## 2011-12-28 ENCOUNTER — Ambulatory Visit
Admission: RE | Admit: 2011-12-28 | Discharge: 2011-12-28 | Disposition: A | Discharge status: Home / Self Care | Payer: Medicare Other | Source: Ambulatory Visit | Attending: Family Medicine | Admitting: Family Medicine

## 2011-12-28 DIAGNOSIS — Z1231 Encounter for screening mammogram for malignant neoplasm of breast: Secondary | ICD-10-CM

## 2012-01-04 ENCOUNTER — Other Ambulatory Visit: Payer: Self-pay

## 2012-01-04 ENCOUNTER — Telehealth: Payer: Self-pay | Admitting: Cardiology

## 2012-01-04 MED ORDER — VARENICLINE TARTRATE 1 MG PO TABS: 1.0000 mg | ORAL_TABLET | Freq: Two times a day (BID) | ORAL | Status: AC

## 2012-01-04 MED ORDER — VARENICLINE TARTRATE 0.5 MG PO TABS: 0.5000 mg | ORAL_TABLET | Freq: Two times a day (BID) | ORAL | Status: AC

## 2012-01-04 NOTE — Telephone Encounter (Signed)
[facility], chantix refill needed, pt at pharmacy, requesting call in asap

## 2012-01-04 NOTE — Telephone Encounter (Signed)
Prescription for chantix sent to [facility].

## 2012-03-05 ENCOUNTER — Other Ambulatory Visit: Payer: Self-pay | Admitting: Cardiology

## 2012-03-05 MED ORDER — ROSUVASTATIN CALCIUM 5 MG PO TABS: 5.0000 mg | ORAL_TABLET | Freq: Every day | ORAL | Status: DC

## 2012-04-11 ENCOUNTER — Other Ambulatory Visit: Payer: Self-pay | Admitting: Family Medicine

## 2012-04-11 DIAGNOSIS — I871 Compression of vein: Secondary | ICD-10-CM

## 2012-04-17 ENCOUNTER — Ambulatory Visit
Admission: RE | Admit: 2012-04-17 | Discharge: 2012-04-17 | Disposition: A | Discharge status: Home / Self Care | Payer: Medicare Other | Source: Ambulatory Visit | Attending: Family Medicine | Admitting: Family Medicine

## 2012-04-17 DIAGNOSIS — I871 Compression of vein: Secondary | ICD-10-CM

## 2012-04-17 MED ORDER — IOHEXOL 300 MG/ML  SOLN
100.0000 mL | Freq: Once | INTRAMUSCULAR | Status: AC | PRN
  Administered 2012-04-17: 100 mL via INTRAVENOUS

## 2012-09-05 DIAGNOSIS — I82409 Acute embolism and thrombosis of unspecified deep veins of unspecified lower extremity: Secondary | ICD-10-CM

## 2012-09-05 HISTORY — DX: Acute embolism and thrombosis of unspecified deep veins of unspecified lower extremity: I82.409

## 2012-09-27 DIAGNOSIS — M6530 Trigger finger, unspecified finger: Secondary | ICD-10-CM | POA: Insufficient documentation

## 2012-10-16 ENCOUNTER — Other Ambulatory Visit: Payer: Self-pay | Admitting: Family Medicine

## 2012-10-16 DIAGNOSIS — R197 Diarrhea, unspecified: Secondary | ICD-10-CM

## 2012-10-19 ENCOUNTER — Other Ambulatory Visit: Payer: Medicare Other

## 2012-10-24 ENCOUNTER — Ambulatory Visit
Admission: RE | Admit: 2012-10-24 | Discharge: 2012-10-24 | Disposition: A | Discharge status: Home / Self Care | Payer: Medicare Other | Source: Ambulatory Visit | Attending: Family Medicine | Admitting: Family Medicine

## 2012-10-24 DIAGNOSIS — R197 Diarrhea, unspecified: Secondary | ICD-10-CM

## 2012-10-24 MED ORDER — IOHEXOL 300 MG/ML  SOLN
100.0000 mL | Freq: Once | INTRAMUSCULAR | Status: AC | PRN
  Administered 2012-10-24: 100 mL via INTRAVENOUS

## 2012-12-03 ENCOUNTER — Other Ambulatory Visit: Payer: Self-pay | Admitting: Gastroenterology

## 2012-12-24 ENCOUNTER — Other Ambulatory Visit: Payer: Self-pay

## 2012-12-24 DIAGNOSIS — Z1231 Encounter for screening mammogram for malignant neoplasm of breast: Secondary | ICD-10-CM

## 2013-01-22 ENCOUNTER — Ambulatory Visit
Admission: RE | Admit: 2013-01-22 | Discharge: 2013-01-22 | Disposition: A | Discharge status: Home / Self Care | Payer: Medicare Other | Source: Ambulatory Visit

## 2013-01-22 DIAGNOSIS — Z1231 Encounter for screening mammogram for malignant neoplasm of breast: Secondary | ICD-10-CM

## 2013-03-13 ENCOUNTER — Encounter: Payer: Self-pay | Admitting: Cardiology

## 2013-03-13 ENCOUNTER — Ambulatory Visit (INDEPENDENT_AMBULATORY_CARE_PROVIDER_SITE_OTHER): Payer: Medicare Other | Admitting: Cardiology

## 2013-03-13 VITALS — BP 128/78 | HR 80 | Ht 63.5 in | Wt 197.0 lb

## 2013-03-13 DIAGNOSIS — E7800 Pure hypercholesterolemia, unspecified: Secondary | ICD-10-CM

## 2013-03-13 DIAGNOSIS — F17200 Nicotine dependence, unspecified, uncomplicated: Secondary | ICD-10-CM

## 2013-03-13 DIAGNOSIS — I2510 Atherosclerotic heart disease of native coronary artery without angina pectoris: Secondary | ICD-10-CM

## 2013-03-13 NOTE — Patient Instructions (Signed)
Consider taking Crestor 5 mg 3 days a week  Continue your other therapy.  I will see you in 1 year.

## 2013-03-13 NOTE — Progress Notes (Signed)
Sandra [name] Date of Birth: 1946-05-08   History of Present Illness: Sandra [name] is seen for followup today. She has a history of coronary disease and is status post stenting of the ostium of the right coronary in 2004. She has a history of hypercholesterolemia and tobacco abuse. She has an occluded IVC noted incidentally on an MRI scan. 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 More recently she developed a DVT involving the left posterior tibial vein. This occurred after receiving treatments of injections and laser therapy for varicose veins. She is now taking Xarelto. She denies any significant chest pain or shortness of breath. She did stop taking her Crestor because of myalgias. She thinks this has improved. She did quit smoking 5 weeks ago and is on Chantix now. She is tolerating this well but does note some increase in vivid dreams.  Current Outpatient Prescriptions on File Prior to Visit  Medication Sig Dispense Refill  . CALCIUM PO Take by mouth daily.        Marland Kitchen guaiFENesin (MUCINEX) 600 MG 12 hr tablet Take 600 mg by mouth daily.        Marland Kitchen ibuprofen (ADVIL,MOTRIN) 100 MG tablet Take 100 mg by mouth every 6 (six) hours as needed.        . metoprolol (TOPROL-XL) 50 MG 24 hr tablet Take 25 mg by mouth daily.       . Multiple Vitamin (MULTIVITAMIN) tablet Take 1 tablet by mouth daily.        . Turmeric 500 MG CAPS Take 500 mg by mouth 2 (two) times daily as needed.        Marland Kitchen VITAMIN D, CHOLECALCIFEROL, PO Take 3,000 mg by mouth daily.         No current facility-administered medications on file prior to visit.    Allergies  Allergen Reactions  . Erythromycin   . Epinephrine Palpitations    Past Medical History  Diagnosis Date  . Coronary artery disease 2004    STATUS POST STENTING OF THE RIGHT CORONARY OSTIUM   . Hypercholesterolemia   . Tobacco dependence   . Sleep apnea   . Congenital absence of left kidney   . Dislocated shoulder     left  . DDD  (degenerative disc disease)   . DVT (deep venous thrombosis)     left posterior tibial vein    Past Surgical History  Procedure Laterality Date  . Total abdominal hysterectomy    . Bone spur shoulder    . Tubal ligation      History  Smoking status  . Former Smoker -- 1.00 packs/day  Smokeless tobacco  . Not on file    History  Alcohol Use No    Family History  Problem Relation Age of Onset  . Leukemia Sister   . Hypertension Brother     Review of Systems: As noted in history of present illness.  All other systems were reviewed and are negative.  Physical Exam: BP 128/78  Pulse 80  Ht 5' 3.5" (1.613 m)  Wt 197 lb (89.359 kg)  BMI 34.35 kg/m2 He is an obese white female in no acute distress. HEENT exam is unremarkable. She has no JVD, adenopathy, thyromegaly, or bruits. Lungs are clear. Cardiac exam reveals a regular rate and rhythm without gallop, murmur, or click. Abdomen is obese, soft, nontender. She has no masses or bruits. Extremities are without edema. Pedal pulses are palpable. She is alert and oriented  x3. Cranial nerves II through XII are intact.  LABORATORY DATA: ECG today demonstrates normal sinus rhythm with a normal ECG. Rate is 80 beats per minute.  Assessment / Plan: 1. Coronary disease with remote stenting of the ostium of the right coronary. She is asymptomatic. Her last stress test was in March of 2010. We discussed followup stress testing but given her recent DVT we have elected to defer this until her next visit.  2. Tobacco dependence. I've encouraged her with her efforts at smoking cessation. Continue Chantix.  3. Left posterior tibial vein DVT. Continue anticoagulation.  4. Occluded IVC. Possibly congenital.

## 2013-04-10 ENCOUNTER — Other Ambulatory Visit: Payer: Self-pay

## 2013-06-19 ENCOUNTER — Encounter: Payer: Medicare Other | Attending: Family Medicine | Admitting: Dietician

## 2013-06-19 VITALS — Ht 63.5 in | Wt 201.1 lb

## 2013-06-19 DIAGNOSIS — Z713 Dietary counseling and surveillance: Secondary | ICD-10-CM | POA: Insufficient documentation

## 2013-06-19 DIAGNOSIS — E119 Type 2 diabetes mellitus without complications: Secondary | ICD-10-CM

## 2013-06-20 ENCOUNTER — Encounter: Payer: Self-pay | Admitting: Dietician

## 2013-06-20 NOTE — Progress Notes (Signed)
Patient was seen on 06/19/13 for the first of a series of three diabetes self-management courses at [facility].  Current HbA1c: 7.0 2 weeks ago  The following learning objectives were met by the patient during this class:  Describe diabetes  State some common risk factors for diabetes  Defines the role of glucose and insulin  Identifies type of diabetes and pathophysiology  Describe the relationship between diabetes and cardiovascular risk  State the members of the Healthcare Team  States the rationale for glucose monitoring  State when to test glucose  State their individual Target Range  State the importance of logging glucose readings  Describe how to interpret glucose readings  Identifies A1C target  Explain the correlation between A1c and eAG values  State symptoms and treatment of high blood glucose  State symptoms and treatment of low blood glucose  Explain proper technique for glucose testing  Identifies proper sharps disposal  Handouts given during class include:  Living Well with Diabetes book  Carb Counting and Meal Planning book  Meal Plan Card  Carbohydrate guide  Meal planning worksheet  Low Sodium Flavoring Tips  The diabetes portion plate  Low Carbohydrate Snack Suggestions  A1c to eAG Conversion Chart  Diabetes Medications  Stress Management  Diabetes Recommended Care Schedule  Diabetes Success Plan  Core Class Satisfaction Survey  Your patient has identified their diabetes care support plan as:  [facility]  Staff Follow-Up Plan:  Attend core 2

## 2013-06-20 NOTE — Patient Instructions (Signed)
Goals:  Monitor glucose levels as instructed by your doctor

## 2013-06-26 ENCOUNTER — Encounter: Payer: Medicare Other | Admitting: Dietician

## 2013-06-26 DIAGNOSIS — E119 Type 2 diabetes mellitus without complications: Secondary | ICD-10-CM

## 2013-06-26 NOTE — Patient Instructions (Signed)
Goals:  Follow Diabetes Meal Plan as instructed  Eat 3 meals and 2 snacks, every 3-5 hrs  Limit carbohydrate intake to 30-45 grams carbohydrate/meal  Limit carbohydrate intake to 15 grams carbohydrate/snack  Add lean protein foods to meals/snacks  Monitor glucose levels as instructed by your doctor  Aim for 30 mins of physical activity daily  Bring food record and glucose log to your next nutrition visit 

## 2013-06-26 NOTE — Progress Notes (Signed)
Patient was seen on 06/26/13 for the second of a series of three diabetes self-management courses at the [facility]. The following learning objectives were met by the patient during this class:   Describe the role of different macronutrients on glucose  Explain how carbohydrates affect blood glucose  State what foods contain the most carbohydrates  Demonstrate carbohydrate counting  Demonstrate how to read Nutrition Facts food label  Describe effects of various fats on heart health  Describe the importance of good nutrition for health and healthy eating strategies  Describe techniques for managing your shopping, cooking and meal planning  List strategies to follow meal plan when dining out  Describe the effects of alcohol on glucose and how to use it safely  Follow-Up Plan:  Attend Core 3  Work towards following your personal food plan.  

## 2013-07-03 DIAGNOSIS — E119 Type 2 diabetes mellitus without complications: Secondary | ICD-10-CM

## 2013-07-03 NOTE — Progress Notes (Signed)
Patient was seen on 07/03/13 for the third of a series of three diabetes self-management courses at the [facility]. The following learning objectives were met by the patient during this class:    State the amount of activity recommended for healthy living   Describe activities suitable for individual needs   Identify ways to regularly incorporate activity into daily life   Identify barriers to activity and ways to over come these barriers  Identify diabetes medications being personally used and their primary action for lowering glucose and possible side effects   Describe role of stress on blood glucose and develop strategies to address psychosocial issues   Identify diabetes complications and ways to prevent them  Explain how to manage diabetes during illness   Evaluate success in meeting personal goal   Establish 2-3 goals that they will plan to diligently work on until they return for the free 48-month follow-up visit  Your patient has established the following 4 month goals in their individualized success plan:  Increase my activity at least 3 days week  Your patient has identified these potential barriers to change:  Social events revolve around food  Your patient has identified their diabetes self-care support plan as  none

## 2013-07-11 ENCOUNTER — Other Ambulatory Visit: Payer: Self-pay

## 2013-11-06 ENCOUNTER — Encounter: Payer: Self-pay | Admitting: *Deleted

## 2013-11-06 ENCOUNTER — Encounter: Payer: Medicare Other | Attending: Family Medicine | Admitting: *Deleted

## 2013-11-06 DIAGNOSIS — Z713 Dietary counseling and surveillance: Secondary | ICD-10-CM | POA: Insufficient documentation

## 2013-11-06 DIAGNOSIS — E119 Type 2 diabetes mellitus without complications: Secondary | ICD-10-CM

## 2013-11-06 NOTE — Progress Notes (Signed)
  Patient was seen on 11/06/13 for their 3 month follow-up as a part of the diabetes self-management courses at the [facility]. The following learning objectives were met by your patient during this course:  Patient self reports the following: Continues taking Metformin 560m XR, 2 tabs with dinner Breakfast; [employer] cheese biscuit & Sweet tea Lunch: raw vegetables, dressing, triscuits  Diabetes control has improved since diabetes self-management training: Yes Number of days blood glucose is >200: one time it was 2085mdl, Her current 7 day avg 161, 14 day avg 165, 30 day avg 161 mg/dl. Last MD appointment for diabetes: 09/02/13 Changes in treatment plan: none Confidence with ability to manage diabetes: moderate Areas for improvement with diabetes self-care: Be more mindful of carbohydrate consumption, Increase activity to 30 minutes 5Xweekly as tolerated. [employer] a treadmill in the garage but it is too cold to use, She has a membership with [employer] and [employer] has not utilized either of them. Willingness to participate in diabetes support group: Not at this time  Follow-Up Plan: Patient to call and schedule as needed.

## 2014-01-02 ENCOUNTER — Other Ambulatory Visit: Payer: Self-pay

## 2014-01-15 ENCOUNTER — Other Ambulatory Visit: Payer: Self-pay

## 2014-01-15 DIAGNOSIS — Z1231 Encounter for screening mammogram for malignant neoplasm of breast: Secondary | ICD-10-CM

## 2014-01-28 ENCOUNTER — Ambulatory Visit
Admission: RE | Admit: 2014-01-28 | Discharge: 2014-01-28 | Disposition: A | Discharge status: Home / Self Care | Payer: 59 | Source: Ambulatory Visit

## 2014-01-28 DIAGNOSIS — Z1231 Encounter for screening mammogram for malignant neoplasm of breast: Secondary | ICD-10-CM

## 2014-03-04 ENCOUNTER — Encounter: Payer: Self-pay | Admitting: Cardiology

## 2014-03-25 ENCOUNTER — Other Ambulatory Visit: Payer: Self-pay | Admitting: Orthopedic Surgery

## 2014-03-25 DIAGNOSIS — M25561 Pain in right knee: Secondary | ICD-10-CM

## 2014-03-27 ENCOUNTER — Ambulatory Visit
Admission: RE | Admit: 2014-03-27 | Discharge: 2014-03-27 | Disposition: A | Discharge status: Home / Self Care | Payer: 59 | Source: Ambulatory Visit | Attending: Orthopedic Surgery | Admitting: Orthopedic Surgery

## 2014-03-27 DIAGNOSIS — M25561 Pain in right knee: Secondary | ICD-10-CM

## 2014-03-28 DIAGNOSIS — M25561 Pain in right knee: Secondary | ICD-10-CM | POA: Insufficient documentation

## 2014-10-09 ENCOUNTER — Other Ambulatory Visit: Payer: Self-pay | Admitting: Orthopaedic Surgery

## 2014-10-09 DIAGNOSIS — M5136 Other intervertebral disc degeneration, lumbar region: Secondary | ICD-10-CM

## 2014-10-09 DIAGNOSIS — M4716 Other spondylosis with myelopathy, lumbar region: Secondary | ICD-10-CM

## 2014-10-24 ENCOUNTER — Ambulatory Visit
Admission: RE | Admit: 2014-10-24 | Discharge: 2014-10-24 | Disposition: A | Discharge status: Home / Self Care | Payer: Medicare Other | Source: Ambulatory Visit | Attending: Orthopaedic Surgery | Admitting: Orthopaedic Surgery

## 2014-10-24 DIAGNOSIS — M5136 Other intervertebral disc degeneration, lumbar region: Secondary | ICD-10-CM

## 2014-10-24 DIAGNOSIS — M4716 Other spondylosis with myelopathy, lumbar region: Secondary | ICD-10-CM

## 2015-05-19 ENCOUNTER — Other Ambulatory Visit: Payer: Self-pay

## 2015-05-19 DIAGNOSIS — Z1231 Encounter for screening mammogram for malignant neoplasm of breast: Secondary | ICD-10-CM

## 2015-05-22 ENCOUNTER — Ambulatory Visit
Admission: RE | Admit: 2015-05-22 | Discharge: 2015-05-22 | Disposition: A | Discharge status: Home / Self Care | Payer: Medicare Other | Source: Ambulatory Visit

## 2015-05-22 DIAGNOSIS — Z1231 Encounter for screening mammogram for malignant neoplasm of breast: Secondary | ICD-10-CM

## 2015-12-22 ENCOUNTER — Other Ambulatory Visit: Payer: Self-pay | Admitting: *Deleted

## 2015-12-22 DIAGNOSIS — R0989 Other specified symptoms and signs involving the circulatory and respiratory systems: Secondary | ICD-10-CM

## 2016-02-08 ENCOUNTER — Telehealth: Payer: Self-pay | Admitting: Cardiology

## 2016-02-08 NOTE — Telephone Encounter (Signed)
New Message  Pt requesting to speak with RN. Pt states that she wants to schedule an appt with Dr. Martinique. Pt stated she did not want to be seen by a APP. Please call back to discuss.

## 2016-02-08 NOTE — Telephone Encounter (Signed)
Returned call to patient.Advised she has appointment with Dr.Jordan 04/25/16 at 9:00 am.Stated she did not realize she already had that appointment.Stated she is doing good she will just keep that appointment.

## 2016-02-15 ENCOUNTER — Encounter: Payer: Self-pay | Admitting: Family

## 2016-02-15 ENCOUNTER — Encounter: Payer: Self-pay | Admitting: Vascular Surgery

## 2016-02-24 ENCOUNTER — Ambulatory Visit (INDEPENDENT_AMBULATORY_CARE_PROVIDER_SITE_OTHER): Payer: Medicare Other | Admitting: Vascular Surgery

## 2016-02-24 ENCOUNTER — Ambulatory Visit (HOSPITAL_COMMUNITY)
Admission: RE | Admit: 2016-02-24 | Discharge: 2016-02-24 | Disposition: A | Discharge status: Home / Self Care | Payer: Medicare Other | Source: Ambulatory Visit | Attending: Vascular Surgery | Admitting: Vascular Surgery

## 2016-02-24 ENCOUNTER — Ambulatory Visit (INDEPENDENT_AMBULATORY_CARE_PROVIDER_SITE_OTHER)
Admission: RE | Admit: 2016-02-24 | Discharge: 2016-02-24 | Disposition: A | Discharge status: Home / Self Care | Payer: Medicare Other | Source: Ambulatory Visit | Attending: Vascular Surgery | Admitting: Vascular Surgery

## 2016-02-24 ENCOUNTER — Encounter: Payer: Self-pay | Admitting: Vascular Surgery

## 2016-02-24 VITALS — BP 137/84 | HR 86 | Ht 63.5 in | Wt 193.9 lb

## 2016-02-24 DIAGNOSIS — Z720 Tobacco use: Secondary | ICD-10-CM | POA: Insufficient documentation

## 2016-02-24 DIAGNOSIS — I70209 Unspecified atherosclerosis of native arteries of extremities, unspecified extremity: Secondary | ICD-10-CM | POA: Diagnosis not present

## 2016-02-24 DIAGNOSIS — N189 Chronic kidney disease, unspecified: Secondary | ICD-10-CM | POA: Insufficient documentation

## 2016-02-24 DIAGNOSIS — G4730 Sleep apnea, unspecified: Secondary | ICD-10-CM | POA: Insufficient documentation

## 2016-02-24 DIAGNOSIS — R938 Abnormal findings on diagnostic imaging of other specified body structures: Secondary | ICD-10-CM | POA: Insufficient documentation

## 2016-02-24 DIAGNOSIS — E7800 Pure hypercholesterolemia, unspecified: Secondary | ICD-10-CM | POA: Diagnosis not present

## 2016-02-24 DIAGNOSIS — R0989 Other specified symptoms and signs involving the circulatory and respiratory systems: Secondary | ICD-10-CM

## 2016-02-24 DIAGNOSIS — I2510 Atherosclerotic heart disease of native coronary artery without angina pectoris: Secondary | ICD-10-CM | POA: Diagnosis not present

## 2016-02-24 DIAGNOSIS — E1122 Type 2 diabetes mellitus with diabetic chronic kidney disease: Secondary | ICD-10-CM | POA: Diagnosis not present

## 2016-02-24 LAB — VAS US CAROTID
LCCADSYS: -65 cm/s
LCCAPDIAS: 10 cm/s
LCCAPSYS: 64 cm/s
LEFT ECA DIAS: 16 cm/s
LICADDIAS: -26 cm/s
LICADSYS: -96 cm/s
LICAPDIAS: -30 cm/s
LICAPSYS: -120 cm/s
Left CCA dist dias: -13 cm/s
RCCAPDIAS: 22 cm/s
RIGHT CCA MID DIAS: 17 cm/s
RIGHT ECA DIAS: 19 cm/s
Right CCA prox sys: 131 cm/s
Right cca dist sys: -71 cm/s

## 2016-02-24 NOTE — Progress Notes (Signed)
[facility] [place]  Patient name: Sandra [name] MRN: [id number] DOB: 03-Jul-1946 Sex: female  REASON FOR CONSULT: Right carotid bruit and diminished peripheral pulses. Referred by Dr. Theadore Nan.  HPI: Sandra [name] is a 70 y.o. female, who was found to have a right carotid bruit and was referred for vascular consultation. In addition she was noted to have diminished femoral pulses.  The patient denies any history of stroke, TIAs, expressive or receptive aphasia, or amaurosis fugax.  I do not get any history of claudication or rest pain. She does get some pain in her back that is associated with ambulation and relieved with rest.  I have reviewed the most recent note from Dr. Baldomero Lamy office. The patient has a history of vitamin D deficiency which is being treated. She also has low back pain, type 2 diabetes, essential hypertension, mixed hyperlipidemia.  Past Medical History  Diagnosis Date  . Coronary artery disease 2004    STATUS POST STENTING OF THE RIGHT CORONARY OSTIUM   . Hypercholesterolemia   . Tobacco dependence   . Sleep apnea   . Congenital absence of left kidney   . Dislocated shoulder     left  . DDD (degenerative disc disease)   . DVT (deep venous thrombosis) ([facility])     left posterior tibial vein  . Diabetes mellitus without complication ([place])   . Chronic kidney disease   . Peripheral vascular disease ([place])     Family History  Problem Relation Age of Onset  . Leukemia Sister   . Hypertension Brother   . Heart disease Father     before age 82    SOCIAL HISTORY: Social History   Social History  . Marital Status: Widowed    Spouse Name: N/A  . Number of Children: 2  . Years of Education: N/A   Occupational History  . Network engineer     retired   Social History Main Topics  . Smoking status: Former Smoker -- 1.00 packs/day    Quit date: 10/27/2015  . Smokeless tobacco: Not on file  . Alcohol Use: No  . Drug Use: No  .  Sexual Activity: Not on file   Other Topics Concern  . Not on file   Social History Narrative    Allergies  Allergen Reactions  . Erythromycin   . Epinephrine Palpitations  . Metformin And Related Rash    Current Outpatient Prescriptions  Medication Sig Dispense Refill  . amitriptyline (ELAVIL) 25 MG tablet TK 1 TO 2 TS PO BID  2  . aspirin 325 MG tablet Take 325 mg by mouth daily.    Marland Kitchen BIOTIN PO Take by mouth.    . cetirizine (ZYRTEC) 10 MG tablet Take 10 mg by mouth daily.    . CHANTIX STARTING MONTH PAK 0.5 MG X 11 & 1 MG X 42 tablet     . fesoterodine (TOVIAZ) 8 MG TB24 Take 8 mg by mouth daily.    . fluticasone (FLONASE) 50 MCG/ACT nasal spray Place 2 sprays into the nose daily.    Marland Kitchen guaiFENesin (MUCINEX) 600 MG 12 hr tablet Take 600 mg by mouth daily.      Marland Kitchen ibuprofen (ADVIL,MOTRIN) 100 MG tablet Take 100 mg by mouth every 6 (six) hours as needed.      Marland Kitchen lisinopril (PRINIVIL,ZESTRIL) 2.5 MG tablet TK 1 T PO QD  3  . metoprolol (TOPROL-XL) 50 MG 24 hr tablet Take 25 mg by mouth daily.     Marland Kitchen  PENNSAID 2 % SOLN   1  . simvastatin (ZOCOR) 20 MG tablet TK 1 T PO QPM  3  . sitaGLIPtin (JANUVIA) 100 MG tablet Take 100 mg by mouth daily.    Marland Kitchen VITAMIN D, CHOLECALCIFEROL, PO Take 2,000 mg by mouth daily.     Marland Kitchen CALCIUM PO Take by mouth daily. Reported on 02/24/2016    . COMBIVENT RESPIMAT 20-100 MCG/ACT AERS respimat 1 puff every 6 (six) hours as needed. Reported on 02/24/2016    . GARCINIA CAMBOGIA-CHROMIUM PO Take by mouth. Reported on 02/24/2016    . metFORMIN (GLUCOPHAGE-XR) 500 MG 24 hr tablet Take 500 mg by mouth daily. Reported on 02/24/2016    . Multiple Vitamin (MULTIVITAMIN) tablet Take 1 tablet by mouth daily. Reported on 02/24/2016    . traMADol (ULTRAM) 50 MG tablet 50 mg every 8 (eight) hours as needed. Reported on 02/24/2016    . Turmeric 500 MG CAPS Take 500 mg by mouth 2 (two) times daily as needed. Reported on 02/24/2016    . XARELTO 15 MG TABS tablet 15 mg daily. Reported  on 02/24/2016     No current facility-administered medications for this visit.    REVIEW OF SYSTEMS:  [X]  denotes positive finding, [ ]  denotes negative finding Cardiac  Comments:  Chest pain or chest pressure:    Shortness of breath upon exertion:    Short of breath when lying flat:    Irregular heart rhythm:        Vascular    Pain in calf, thigh, or hip brought on by ambulation:    Pain in feet at night that wakes you up from your sleep:     Blood clot in your veins:    Leg swelling:         Pulmonary    Oxygen at home:    Productive cough:     Wheezing:         Neurologic    Sudden weakness in arms or legs:     Sudden numbness in arms or legs:     Sudden onset of difficulty speaking or slurred speech:    Temporary loss of vision in one eye:     Problems with dizziness:         Gastrointestinal    Blood in stool:     Vomited blood:         Genitourinary    Burning when urinating:     Blood in urine:        Psychiatric    Major depression:         Hematologic    Bleeding problems:    Problems with blood clotting too easily:        Skin    Rashes or ulcers:        Constitutional    Fever or chills:      PHYSICAL EXAM: Filed Vitals:   02/24/16 1011 02/24/16 1019  BP: 138/84 137/84  Pulse: 86   Height: 5' 3.5" (1.613 m)   Weight: 193 lb 14.4 oz (87.952 kg)   SpO2: 96%     GENERAL: The patient is a well-nourished female, in no acute distress. The vital signs are documented above. CARDIAC: There is a regular rate and rhythm.  VASCULAR: she does have a right carotid bruit. She has palpable femoral pulses. I cannot palpate dorsalis pedis pulses although she does have palpable posterior tibial pulses. PULMONARY: There is good air exchange bilaterally without wheezing or rales. ABDOMEN:  Soft and non-tender with normal pitched bowel sounds.  MUSCULOSKELETAL: There are no major deformities or cyanosis. NEUROLOGIC: No focal weakness or paresthesias are  detected. SKIN: There are no ulcers or rashes noted. PSYCHIATRIC: The patient has a normal affect.   DATA:   CAROTID DUPLEX: I have independently interpreted her carotid duplex scan. There is no significant carotid stenosis on either side. The mid to distal left internal carotid artery is somewhat tortuous. There are some mildly elevated velocities in the right external carotid artery that might explain her carotid bruit.  ARTERIAL DOPPLER STUDY: Independent interpreted her arterial Doppler study which shows an ABI of [place] percent bilaterally. There are triphasic Doppler signals in the posterior tibial arteries bilaterally with a triphasic right anterior tibial signal and a biphasic left anterior tibial signal.  CT ABDOMEN PELVIS: I have reviewed the CT of the abdomen and pelvis that was performed on 10/24/2012. This shows congenital absence of the IVC with hypertrophy of the azygos and hemiazygos systems.  MEDICAL ISSUES:  RIGHT CAROTID BRUIT: Her right carotid bruit may be from a very mild right external carotid artery stenosis which is not clinically significant. I reassured her that she had no evidence of internal carotid artery disease bilaterally. She does not need a routine follow up study unless she develops new symptoms.  PERIPHERAL VASCULAR DISEASE: I cannot palpate dorsalis pedis pulses although she does have palpable posterior tibial pulses. Her noninvasive study shows normal ABIs bilaterally with normal Doppler signals in the dorsalis pedis positions bilaterally. Thus I do not think she needs any further workup for peripheral vascular disease at this time.  Of note, she does have a known congenitally absent IVC.   Deitra Mayo [facility] [place] [phone]

## 2016-02-26 ENCOUNTER — Encounter: Payer: Self-pay | Admitting: Orthopedic Surgery

## 2016-04-11 ENCOUNTER — Encounter: Payer: Self-pay | Admitting: *Deleted

## 2016-04-25 ENCOUNTER — Ambulatory Visit (INDEPENDENT_AMBULATORY_CARE_PROVIDER_SITE_OTHER): Payer: Medicare Other | Admitting: Cardiology

## 2016-04-25 ENCOUNTER — Encounter: Payer: Self-pay | Admitting: Cardiology

## 2016-04-25 VITALS — BP 124/70 | HR 83 | Ht 63.5 in | Wt 197.2 lb

## 2016-04-25 DIAGNOSIS — I871 Compression of vein: Secondary | ICD-10-CM

## 2016-04-25 DIAGNOSIS — I25118 Atherosclerotic heart disease of native coronary artery with other forms of angina pectoris: Secondary | ICD-10-CM

## 2016-04-25 DIAGNOSIS — E7800 Pure hypercholesterolemia, unspecified: Secondary | ICD-10-CM

## 2016-04-25 MED ORDER — ASPIRIN EC 81 MG PO TBEC: 81.0000 mg | DELAYED_RELEASE_TABLET | Freq: Every day | ORAL | 3 refills | Status: AC

## 2016-04-25 NOTE — Addendum Note (Signed)
Addended by: Kathyrn Lass on: 04/25/2016 10:13 AM   Modules accepted: Orders

## 2016-04-25 NOTE — Progress Notes (Signed)
Sandra [name] Date of Birth: July 27, 1946   History of Present Illness: Sandra [name] is seen for followup today. She was last seen in 2014. She has a history of coronary disease and is status post stenting of the ostium of the right coronary in 2004. She has a history of hypercholesterolemia and tobacco abuse. She has an occluded IVC noted incidentally on an MRI and CT scans. 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 She has a history of DVT involving the left posterior tibial vein. This occurred after receiving treatments of injections and laser therapy for varicose veins. She is no longer taking Xarelto. She thinks this has improved. She did quit smoking before but had a recent relapse for 2 weeks. She is planning to stop again.  She was evaluated by vascular surgery in June due to right carotid bruit and nonpalpable DP pulse. Carotid dopplers showed tortuosity of the right carotid but no obstruction. LE dopplers were normal. On follow up today she does note3-4 episodes over the past month where she will get a hot flash in her shoulders with sensation of her arms feeling heavy. This lasts for 30 seconds. Similar to symptoms prior to stent. Reports BS is running high. No dyspnea.  Current Outpatient Prescriptions on File Prior to Visit  Medication Sig Dispense Refill  . BIOTIN PO Take by mouth.    Marland Kitchen CALCIUM PO Take by mouth daily. Reported on 02/24/2016    . cetirizine (ZYRTEC) 10 MG tablet Take 10 mg by mouth daily.    . CHANTIX STARTING MONTH PAK 0.5 MG X 11 & 1 MG X 42 tablet     . COMBIVENT RESPIMAT 20-100 MCG/ACT AERS respimat 1 puff every 6 (six) hours as needed. Reported on 02/24/2016    . fesoterodine (TOVIAZ) 8 MG TB24 Take 8 mg by mouth daily.    . fluticasone (FLONASE) 50 MCG/ACT nasal spray Place 2 sprays into the nose daily.    Marland Kitchen GARCINIA CAMBOGIA-CHROMIUM PO Take by mouth. Reported on 02/24/2016    . guaiFENesin (MUCINEX) 600 MG 12 hr tablet Take 600 mg by mouth  daily.      Marland Kitchen ibuprofen (ADVIL,MOTRIN) 100 MG tablet Take 100 mg by mouth every 6 (six) hours as needed.      . metFORMIN (GLUCOPHAGE-XR) 500 MG 24 hr tablet Take 500 mg by mouth daily. Reported on 02/24/2016    . metoprolol (TOPROL-XL) 50 MG 24 hr tablet Take 25 mg by mouth daily.     . Multiple Vitamin (MULTIVITAMIN) tablet Take 1 tablet by mouth daily. Reported on 02/24/2016    . PENNSAID 2 % SOLN Apply 1 application topically 2 (two) times daily as needed (knee pain).   1  . sitaGLIPtin (JANUVIA) 100 MG tablet Take 100 mg by mouth daily.    . traMADol (ULTRAM) 50 MG tablet 50 mg every 8 (eight) hours as needed. Reported on 02/24/2016    . Turmeric 500 MG CAPS Take 500 mg by mouth 2 (two) times daily as needed. Reported on 02/24/2016    . VITAMIN D, CHOLECALCIFEROL, PO Take 2,000 mg by mouth daily.      No current facility-administered medications on file prior to visit.     Allergies  Allergen Reactions  . Erythromycin   . Epinephrine Palpitations  . Metformin And Related Rash    Past Medical History:  Diagnosis Date  . Chronic kidney disease   . Congenital absence of left kidney   .  Coronary artery disease 2004   STATUS POST STENTING OF THE RIGHT CORONARY OSTIUM   . DDD (degenerative disc disease)   . Diabetes mellitus without complication ([facility])   . Dislocated shoulder    left  . DVT (deep venous thrombosis) ([facility])    left posterior tibial vein  . Hypercholesterolemia   . Peripheral vascular disease ([place])   . Sleep apnea   . Tobacco dependence     Past Surgical History:  Procedure Laterality Date  . bone spur shoulder    . TOTAL ABDOMINAL HYSTERECTOMY    . TUBAL LIGATION      History  Smoking Status  . Former Smoker  . Packs/day: 1.00  . Quit date: 10/27/2015  Smokeless Tobacco  . Never Used    History  Alcohol Use No    Family History  Problem Relation Age of Onset  . Leukemia Sister   . Hypertension Brother   . Heart disease Father     before age 52     Review of Systems: As noted in history of present illness.  All other systems were reviewed and are negative.  Physical Exam: BP 124/70   Pulse 83   Ht 5' 3.5" (1.613 m)   Wt 197 lb 3.2 oz (89.4 kg)   BMI 34.38 kg/m  He is an obese white female in no acute distress. HEENT exam is unremarkable. She has no JVD, adenopathy, thyromegaly. Minimal right carotid bruit.  Lungs are clear. Cardiac exam reveals a regular rate and rhythm without gallop, murmur, or click. Abdomen is obese, soft, nontender. She has no masses or bruits. Extremities are without edema. Pedal pulses are palpable. She is alert and oriented x3. Cranial nerves II through XII are intact.  LABORATORY DATA: ECG today demonstrates normal sinus rhythm with a LAD. Rate is 83 beats per minute. Otherwise normal. I have personally reviewed and interpreted this study.  Labs reviewed from primary care September 2016. Normal CMET. Cholesterol 135, trig- 247, HDL 31, LDL 55.  Assessment / Plan: 1. Coronary disease with remote stenting of the ostium of the right coronary. She is having some atypical symptoms recently. Her last stress test was in March of 2010. We will schedule her for a Lexiscan myoview study. Reduce ASA to 81 mg daily.   2. Tobacco dependence. I've encouraged her with her efforts at smoking cessation.   3. Left posterior tibial vein DVT. Resolved.  4. Occluded IVC. Congenital.  5. HTN controlled. Continue therapy  6. Dyslipidemia. On simvastatin  7. DM type 2. Per primary care.

## 2016-04-25 NOTE — Patient Instructions (Addendum)
We will schedule you for a nuclear stress test.  Reduce ASA to 81 mg daily

## 2016-05-12 ENCOUNTER — Telehealth (HOSPITAL_COMMUNITY): Payer: Self-pay

## 2016-05-12 NOTE — Telephone Encounter (Signed)
Encounter complete. 

## 2016-05-17 ENCOUNTER — Ambulatory Visit (HOSPITAL_COMMUNITY)
Admission: RE | Admit: 2016-05-17 | Discharge: 2016-05-17 | Disposition: A | Discharge status: Home / Self Care | Payer: Medicare Other | Source: Ambulatory Visit | Attending: Cardiovascular Disease | Admitting: Cardiovascular Disease

## 2016-05-17 DIAGNOSIS — I871 Compression of vein: Secondary | ICD-10-CM | POA: Diagnosis not present

## 2016-05-17 DIAGNOSIS — Z6833 Body mass index (BMI) 33.0-33.9, adult: Secondary | ICD-10-CM | POA: Insufficient documentation

## 2016-05-17 DIAGNOSIS — G4733 Obstructive sleep apnea (adult) (pediatric): Secondary | ICD-10-CM | POA: Diagnosis not present

## 2016-05-17 DIAGNOSIS — I10 Essential (primary) hypertension: Secondary | ICD-10-CM | POA: Insufficient documentation

## 2016-05-17 DIAGNOSIS — Z8249 Family history of ischemic heart disease and other diseases of the circulatory system: Secondary | ICD-10-CM | POA: Insufficient documentation

## 2016-05-17 DIAGNOSIS — I25118 Atherosclerotic heart disease of native coronary artery with other forms of angina pectoris: Secondary | ICD-10-CM

## 2016-05-17 DIAGNOSIS — R5383 Other fatigue: Secondary | ICD-10-CM | POA: Diagnosis not present

## 2016-05-17 DIAGNOSIS — E663 Overweight: Secondary | ICD-10-CM | POA: Insufficient documentation

## 2016-05-17 DIAGNOSIS — E1151 Type 2 diabetes mellitus with diabetic peripheral angiopathy without gangrene: Secondary | ICD-10-CM | POA: Insufficient documentation

## 2016-05-17 DIAGNOSIS — E7800 Pure hypercholesterolemia, unspecified: Secondary | ICD-10-CM

## 2016-05-17 DIAGNOSIS — R0609 Other forms of dyspnea: Secondary | ICD-10-CM | POA: Diagnosis not present

## 2016-05-17 LAB — MYOCARDIAL PERFUSION IMAGING
CHL CUP NUCLEAR SRS: 0
CSEPPHR: 88 {beats}/min
LV dias vol: 66 mL (ref 46–106)
LV sys vol: 21 mL
Rest HR: 75 {beats}/min
SDS: 2
SSS: 2
TID: 1.38

## 2016-05-17 MED ORDER — TECHNETIUM TC 99M TETROFOSMIN IV KIT
10.7000 | PACK | Freq: Once | INTRAVENOUS | Status: AC | PRN
  Administered 2016-05-17: 10.7 via INTRAVENOUS
  Filled 2016-05-17: qty 11

## 2016-05-17 MED ORDER — TECHNETIUM TC 99M TETROFOSMIN IV KIT
32.1000 | PACK | Freq: Once | INTRAVENOUS | Status: AC | PRN
  Administered 2016-05-17: 32.1 via INTRAVENOUS
  Filled 2016-05-17: qty 32

## 2016-05-17 MED ORDER — AMINOPHYLLINE 25 MG/ML IV SOLN
75.0000 mg | Freq: Once | INTRAVENOUS | Status: AC
  Administered 2016-05-17: 75 mg via INTRAVENOUS

## 2016-05-17 MED ORDER — REGADENOSON 0.4 MG/5ML IV SOLN
0.4000 mg | Freq: Once | INTRAVENOUS | Status: AC
  Administered 2016-05-17: 0.4 mg via INTRAVENOUS

## 2016-06-06 ENCOUNTER — Other Ambulatory Visit: Payer: Self-pay | Admitting: Family Medicine

## 2016-06-06 DIAGNOSIS — Z1231 Encounter for screening mammogram for malignant neoplasm of breast: Secondary | ICD-10-CM

## 2016-06-22 ENCOUNTER — Ambulatory Visit
Admission: RE | Admit: 2016-06-22 | Discharge: 2016-06-22 | Disposition: A | Discharge status: Home / Self Care | Payer: Medicare Other | Source: Ambulatory Visit | Attending: Family Medicine | Admitting: Family Medicine

## 2016-06-22 DIAGNOSIS — Z1231 Encounter for screening mammogram for malignant neoplasm of breast: Secondary | ICD-10-CM

## 2016-11-11 DIAGNOSIS — R69 Illness, unspecified: Secondary | ICD-10-CM | POA: Diagnosis not present

## 2016-12-19 DIAGNOSIS — D235 Other benign neoplasm of skin of trunk: Secondary | ICD-10-CM | POA: Diagnosis not present

## 2016-12-19 DIAGNOSIS — L820 Inflamed seborrheic keratosis: Secondary | ICD-10-CM | POA: Diagnosis not present

## 2016-12-19 DIAGNOSIS — L280 Lichen simplex chronicus: Secondary | ICD-10-CM | POA: Diagnosis not present

## 2016-12-19 DIAGNOSIS — L814 Other melanin hyperpigmentation: Secondary | ICD-10-CM | POA: Diagnosis not present

## 2016-12-19 DIAGNOSIS — L821 Other seborrheic keratosis: Secondary | ICD-10-CM | POA: Diagnosis not present

## 2017-01-03 DIAGNOSIS — Z791 Long term (current) use of non-steroidal anti-inflammatories (NSAID): Secondary | ICD-10-CM | POA: Diagnosis not present

## 2017-01-03 DIAGNOSIS — J301 Allergic rhinitis due to pollen: Secondary | ICD-10-CM | POA: Diagnosis not present

## 2017-01-03 DIAGNOSIS — M170 Bilateral primary osteoarthritis of knee: Secondary | ICD-10-CM | POA: Diagnosis not present

## 2017-01-03 DIAGNOSIS — Z7982 Long term (current) use of aspirin: Secondary | ICD-10-CM | POA: Diagnosis not present

## 2017-01-03 DIAGNOSIS — Z959 Presence of cardiac and vascular implant and graft, unspecified: Secondary | ICD-10-CM | POA: Diagnosis not present

## 2017-01-03 DIAGNOSIS — Z7984 Long term (current) use of oral hypoglycemic drugs: Secondary | ICD-10-CM | POA: Diagnosis not present

## 2017-01-03 DIAGNOSIS — R011 Cardiac murmur, unspecified: Secondary | ICD-10-CM | POA: Diagnosis not present

## 2017-01-03 DIAGNOSIS — N3281 Overactive bladder: Secondary | ICD-10-CM | POA: Diagnosis not present

## 2017-01-03 DIAGNOSIS — E785 Hyperlipidemia, unspecified: Secondary | ICD-10-CM | POA: Diagnosis not present

## 2017-01-03 DIAGNOSIS — Z0000 Encounter for general adult medical examination without abnormal findings: Secondary | ICD-10-CM | POA: Diagnosis not present

## 2017-01-03 DIAGNOSIS — Z79899 Other long term (current) drug therapy: Secondary | ICD-10-CM | POA: Diagnosis not present

## 2017-01-03 DIAGNOSIS — E669 Obesity, unspecified: Secondary | ICD-10-CM | POA: Diagnosis not present

## 2017-01-03 DIAGNOSIS — R0600 Dyspnea, unspecified: Secondary | ICD-10-CM | POA: Diagnosis not present

## 2017-01-03 DIAGNOSIS — Z87891 Personal history of nicotine dependence: Secondary | ICD-10-CM | POA: Diagnosis not present

## 2017-01-03 DIAGNOSIS — E1151 Type 2 diabetes mellitus with diabetic peripheral angiopathy without gangrene: Secondary | ICD-10-CM | POA: Diagnosis not present

## 2017-01-03 DIAGNOSIS — Z6834 Body mass index (BMI) 34.0-34.9, adult: Secondary | ICD-10-CM | POA: Diagnosis not present

## 2017-01-03 DIAGNOSIS — M545 Low back pain: Secondary | ICD-10-CM | POA: Diagnosis not present

## 2017-02-07 DIAGNOSIS — R32 Unspecified urinary incontinence: Secondary | ICD-10-CM | POA: Diagnosis not present

## 2017-02-07 DIAGNOSIS — J309 Allergic rhinitis, unspecified: Secondary | ICD-10-CM | POA: Diagnosis not present

## 2017-02-07 DIAGNOSIS — E1165 Type 2 diabetes mellitus with hyperglycemia: Secondary | ICD-10-CM | POA: Diagnosis not present

## 2017-02-07 DIAGNOSIS — M25569 Pain in unspecified knee: Secondary | ICD-10-CM | POA: Diagnosis not present

## 2017-02-07 DIAGNOSIS — E559 Vitamin D deficiency, unspecified: Secondary | ICD-10-CM | POA: Diagnosis not present

## 2017-02-07 DIAGNOSIS — Q600 Renal agenesis, unilateral: Secondary | ICD-10-CM | POA: Diagnosis not present

## 2017-02-07 DIAGNOSIS — E782 Mixed hyperlipidemia: Secondary | ICD-10-CM | POA: Diagnosis not present

## 2017-02-07 DIAGNOSIS — I10 Essential (primary) hypertension: Secondary | ICD-10-CM | POA: Diagnosis not present

## 2017-02-07 DIAGNOSIS — Z7984 Long term (current) use of oral hypoglycemic drugs: Secondary | ICD-10-CM | POA: Diagnosis not present

## 2017-02-07 DIAGNOSIS — I2510 Atherosclerotic heart disease of native coronary artery without angina pectoris: Secondary | ICD-10-CM | POA: Diagnosis not present

## 2017-02-08 DIAGNOSIS — E559 Vitamin D deficiency, unspecified: Secondary | ICD-10-CM | POA: Diagnosis not present

## 2017-02-08 DIAGNOSIS — R69 Illness, unspecified: Secondary | ICD-10-CM | POA: Diagnosis not present

## 2017-02-08 DIAGNOSIS — I10 Essential (primary) hypertension: Secondary | ICD-10-CM | POA: Diagnosis not present

## 2017-02-08 DIAGNOSIS — M25569 Pain in unspecified knee: Secondary | ICD-10-CM | POA: Diagnosis not present

## 2017-02-08 DIAGNOSIS — E782 Mixed hyperlipidemia: Secondary | ICD-10-CM | POA: Diagnosis not present

## 2017-02-08 DIAGNOSIS — J309 Allergic rhinitis, unspecified: Secondary | ICD-10-CM | POA: Diagnosis not present

## 2017-02-08 DIAGNOSIS — Q600 Renal agenesis, unilateral: Secondary | ICD-10-CM | POA: Diagnosis not present

## 2017-02-08 DIAGNOSIS — R32 Unspecified urinary incontinence: Secondary | ICD-10-CM | POA: Diagnosis not present

## 2017-02-08 DIAGNOSIS — I2510 Atherosclerotic heart disease of native coronary artery without angina pectoris: Secondary | ICD-10-CM | POA: Diagnosis not present

## 2017-02-08 DIAGNOSIS — E1165 Type 2 diabetes mellitus with hyperglycemia: Secondary | ICD-10-CM | POA: Diagnosis not present

## 2017-03-06 DIAGNOSIS — R69 Illness, unspecified: Secondary | ICD-10-CM | POA: Diagnosis not present

## 2017-03-14 DIAGNOSIS — R69 Illness, unspecified: Secondary | ICD-10-CM | POA: Diagnosis not present

## 2017-03-28 DIAGNOSIS — R69 Illness, unspecified: Secondary | ICD-10-CM | POA: Diagnosis not present

## 2017-05-25 DIAGNOSIS — E782 Mixed hyperlipidemia: Secondary | ICD-10-CM | POA: Diagnosis not present

## 2017-05-25 DIAGNOSIS — Q600 Renal agenesis, unilateral: Secondary | ICD-10-CM | POA: Diagnosis not present

## 2017-05-25 DIAGNOSIS — E559 Vitamin D deficiency, unspecified: Secondary | ICD-10-CM | POA: Diagnosis not present

## 2017-05-25 DIAGNOSIS — Z23 Encounter for immunization: Secondary | ICD-10-CM | POA: Diagnosis not present

## 2017-05-25 DIAGNOSIS — E119 Type 2 diabetes mellitus without complications: Secondary | ICD-10-CM | POA: Diagnosis not present

## 2017-05-25 DIAGNOSIS — I10 Essential (primary) hypertension: Secondary | ICD-10-CM | POA: Diagnosis not present

## 2017-05-25 DIAGNOSIS — Z0000 Encounter for general adult medical examination without abnormal findings: Secondary | ICD-10-CM | POA: Diagnosis not present

## 2017-05-25 DIAGNOSIS — R32 Unspecified urinary incontinence: Secondary | ICD-10-CM | POA: Diagnosis not present

## 2017-05-25 DIAGNOSIS — I2510 Atherosclerotic heart disease of native coronary artery without angina pectoris: Secondary | ICD-10-CM | POA: Diagnosis not present

## 2017-05-25 DIAGNOSIS — E1165 Type 2 diabetes mellitus with hyperglycemia: Secondary | ICD-10-CM | POA: Diagnosis not present

## 2017-06-16 DIAGNOSIS — R69 Illness, unspecified: Secondary | ICD-10-CM | POA: Diagnosis not present

## 2017-06-27 DIAGNOSIS — E1165 Type 2 diabetes mellitus with hyperglycemia: Secondary | ICD-10-CM | POA: Diagnosis not present

## 2017-06-27 DIAGNOSIS — Z23 Encounter for immunization: Secondary | ICD-10-CM | POA: Diagnosis not present

## 2017-06-27 DIAGNOSIS — Z87891 Personal history of nicotine dependence: Secondary | ICD-10-CM | POA: Diagnosis not present

## 2017-06-27 DIAGNOSIS — Z1389 Encounter for screening for other disorder: Secondary | ICD-10-CM | POA: Diagnosis not present

## 2017-06-27 DIAGNOSIS — R7989 Other specified abnormal findings of blood chemistry: Secondary | ICD-10-CM | POA: Diagnosis not present

## 2017-06-27 DIAGNOSIS — M159 Polyosteoarthritis, unspecified: Secondary | ICD-10-CM | POA: Diagnosis not present

## 2017-06-27 DIAGNOSIS — Z1159 Encounter for screening for other viral diseases: Secondary | ICD-10-CM | POA: Diagnosis not present

## 2017-06-27 DIAGNOSIS — R32 Unspecified urinary incontinence: Secondary | ICD-10-CM | POA: Diagnosis not present

## 2017-06-27 DIAGNOSIS — Z79899 Other long term (current) drug therapy: Secondary | ICD-10-CM | POA: Diagnosis not present

## 2017-06-27 DIAGNOSIS — Z0000 Encounter for general adult medical examination without abnormal findings: Secondary | ICD-10-CM | POA: Diagnosis not present

## 2017-07-11 ENCOUNTER — Encounter: Payer: Self-pay | Admitting: Skilled Nursing Facility1

## 2017-07-11 ENCOUNTER — Encounter: Payer: Medicare HMO | Attending: Family Medicine | Admitting: Skilled Nursing Facility1

## 2017-07-11 DIAGNOSIS — Z713 Dietary counseling and surveillance: Secondary | ICD-10-CM | POA: Insufficient documentation

## 2017-07-11 DIAGNOSIS — E119 Type 2 diabetes mellitus without complications: Secondary | ICD-10-CM

## 2017-07-11 DIAGNOSIS — I10 Essential (primary) hypertension: Secondary | ICD-10-CM | POA: Diagnosis not present

## 2017-07-11 NOTE — Progress Notes (Signed)
Diabetes Self-Management Education  Visit Type: Follow-up  07/11/2017  Ms. Sandra [name], identified by name and date of birth, is a 71 y.o. female with a diagnosis of Diabetes:  .   ASSESSMENT  There were no vitals taken for this visit. There is no height or weight on file to calculate BMI.  Pt states her A1C is 9.8. Pt states she is now taking. Pt states her A1C was 7.3 for about 4.5 years and is now up to 9.8. 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 Pt has attended the core classes. Pt states she is trying to walk more aiming for 10000 steps. Pt states she had 311 fasting this morning and 2 hours after eating 185. Pt states she is trying to lose weight. Pt states she has hip problems. Pt staese she is interested in meeting with an endocrinologist.Pt states she wants to walk throughout the day in her house.   Diabetes Self-Management Education - 07/11/17 1417      Visit Information   Visit Type  Follow-up      Health Coping   How would you rate your overall health?  Good      Psychosocial Assessment   Patient Belief/Attitude about Diabetes  Motivated to manage diabetes    Self-care barriers  None    Self-management support  Family    Patient Concerns  Nutrition/Meal planning;Healthy Lifestyle    Special Needs  None    Learning Readiness  Ready    How often do you need to have someone help you when you read instructions, pamphlets, or other written materials from your doctor or pharmacy?  1 - Never      Pre-Education Assessment   Patient understands the diabetes disease and treatment process.  Needs Review    Patient understands incorporating nutritional management into lifestyle.  Needs Review    Patient undertands incorporating physical activity into lifestyle.  Needs Review    Patient understands using medications safely.  Needs Review    Patient understands monitoring blood glucose, interpreting and using results  Needs Review    Patient understands prevention,  detection, and treatment of acute complications.  Needs Review    Patient understands prevention, detection, and treatment of chronic complications.  Needs Review    Patient understands how to develop strategies to address psychosocial issues.  Needs Review    Patient understands how to develop strategies to promote health/change behavior.  Needs Review      Complications   Last HgB A1C per patient/outside source  9.8 %    How often do you check your blood sugar?  1-2 times/day    Fasting Blood glucose range (mg/dL)  >200    Postprandial Blood glucose range (mg/dL)  180-200    Have you had a dilated eye exam in the past 12 months?  Yes    Have you had a dental exam in the past 12 months?  Yes    Are you checking your feet?  No      Dietary Intake   Breakfast  oatmeal or 2 bacon and 2 eggs or fast food biscuit    Snack (morning)  nutri system bar    Lunch  texi melt wrap    Dinner  sandwich or chicken and rice and salad     Snack (evening)  nutri system ice cream----popcorn or candy or fruit    Beverage(s)  water, coke zero, decaf unsweet tea      Exercise   Exercise Type  Light (walking / raking leaves)    How many days per week to you exercise?  4    How many minutes per day do you exercise?  30    Total minutes per week of exercise  120      Patient Education   Previous Diabetes Education  Yes (please comment)    Nutrition management   Meal timing in regards to the patients' current diabetes medication.;Carbohydrate counting;Role of diet in the treatment of diabetes and the relationship between the three main macronutrients and blood glucose level;Reviewed blood glucose goals for pre and post meals and how to evaluate the patients' food intake on their blood glucose level.;Food label reading, portion sizes and measuring food.;Information on hints to eating out and maintain blood glucose control.    Physical activity and exercise   Role of exercise on diabetes management, blood  pressure control and cardiac health.;Identified with patient nutritional and/or medication changes necessary with exercise.    Monitoring  Purpose and frequency of SMBG.;Yearly dilated eye exam;Daily foot exams    Acute complications  Taught treatment of hypoglycemia - the 15 rule.    Chronic complications  Assessed and discussed foot care and prevention of foot problems;Nephropathy, what it is, prevention of, the use of ACE, ARB's and early detection of through urine microalbumia.    Psychosocial adjustment  Worked with patient to identify barriers to care and solutions;Role of stress on diabetes      Individualized Goals (developed by patient)   Nutrition  General guidelines for healthy choices and portions discussed;Adjust meds/carbs with exercise as discussed    Physical Activity  Exercise 5-7 days per week;15 minutes per day    Monitoring   test my blood glucose as discussed;test blood glucose pre and post meals as discussed      Patient Self-Evaluation of Goals - Patient rates self as meeting previously set goals (% of time)   Nutrition  25 - 50%    Physical Activity  50 - 75 %    Medications  >75%    Monitoring  >75%    Problem Solving  50 - 75 %    Reducing Risk  50 - 75 %    Health Coping  25 - 50%      Post-Education Assessment   Patient understands the diabetes disease and treatment process.  Demonstrates understanding / competency    Patient understands incorporating nutritional management into lifestyle.  Demonstrates understanding / competency    Patient undertands incorporating physical activity into lifestyle.  Demonstrates understanding / competency    Patient understands using medications safely.  Demonstrates understanding / competency    Patient understands monitoring blood glucose, interpreting and using results  Demonstrates understanding / competency    Patient understands prevention, detection, and treatment of acute complications.  Demonstrates understanding /  competency    Patient understands prevention, detection, and treatment of chronic complications.  Demonstrates understanding / competency    Patient understands how to develop strategies to address psychosocial issues.  Demonstrates understanding / competency    Patient understands how to develop strategies to promote health/change behavior.  Demonstrates understanding / competency      Outcomes   Expected Outcomes  Demonstrated interest in learning. Expect positive outcomes    Program Status  Completed      Subsequent Visit   Since your last visit have you continued or begun to take your medications as prescribed?  Yes    Since your last visit have you experienced  any weight changes?  Gain    Since your last visit, are you checking your blood glucose at least once a day?  Yes       Individualized Plan for Diabetes Self-Management Training:   Learning Objective:  Patient will have a greater understanding of diabetes self-management. Patient education plan is to attend individual and/or group sessions per assessed needs and concerns.   Plan:   There are no Patient Instructions on file for this visit.  Expected Outcomes:  Demonstrated interest in learning. Expect positive outcomes  Education material provided: Meal plan card, My Plate and Snack sheet  If problems or questions, patient to contact team via:  Phone  Future DSME appointment:

## 2017-07-25 ENCOUNTER — Other Ambulatory Visit: Payer: Self-pay | Admitting: Family Medicine

## 2017-07-25 DIAGNOSIS — Z1231 Encounter for screening mammogram for malignant neoplasm of breast: Secondary | ICD-10-CM

## 2017-08-15 DIAGNOSIS — H2513 Age-related nuclear cataract, bilateral: Secondary | ICD-10-CM | POA: Diagnosis not present

## 2017-08-15 DIAGNOSIS — H04123 Dry eye syndrome of bilateral lacrimal glands: Secondary | ICD-10-CM | POA: Diagnosis not present

## 2017-08-21 DIAGNOSIS — E669 Obesity, unspecified: Secondary | ICD-10-CM | POA: Diagnosis not present

## 2017-08-21 DIAGNOSIS — Z6834 Body mass index (BMI) 34.0-34.9, adult: Secondary | ICD-10-CM | POA: Diagnosis not present

## 2017-08-21 DIAGNOSIS — M791 Myalgia, unspecified site: Secondary | ICD-10-CM | POA: Diagnosis not present

## 2017-08-21 DIAGNOSIS — M150 Primary generalized (osteo)arthritis: Secondary | ICD-10-CM | POA: Diagnosis not present

## 2017-08-21 DIAGNOSIS — G894 Chronic pain syndrome: Secondary | ICD-10-CM | POA: Diagnosis not present

## 2017-08-21 DIAGNOSIS — I2510 Atherosclerotic heart disease of native coronary artery without angina pectoris: Secondary | ICD-10-CM | POA: Diagnosis not present

## 2017-08-23 ENCOUNTER — Ambulatory Visit
Admission: RE | Admit: 2017-08-23 | Discharge: 2017-08-23 | Disposition: A | Discharge status: Home / Self Care | Payer: Medicare HMO | Source: Ambulatory Visit | Attending: Family Medicine | Admitting: Family Medicine

## 2017-08-23 DIAGNOSIS — Z1231 Encounter for screening mammogram for malignant neoplasm of breast: Secondary | ICD-10-CM

## 2017-08-26 IMAGING — NM NM MISC PROCEDURE
6 series · 36 of 36 positions shown · non-contrast
Comparison: none

[Series 1: wbr rest · 6.40mm/px · 6 of 64 frames shown]
[frame 6/64]
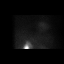
[frame 16/64]
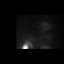
[frame 27/64]
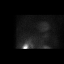
[frame 38/64]
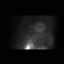
[frame 48/64]
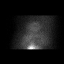
[frame 59/64]
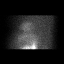

[Series 1: wbr_r-proj_st wbr rest · 6.40mm/px · 6 of 64 frames shown]
[frame 6/64]
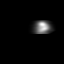
[frame 16/64]
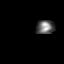
[frame 27/64]
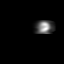
[frame 38/64]
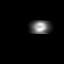
[frame 48/64]
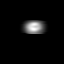
[frame 59/64]
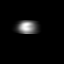

[Series 2: wbr stress-gsp · 6.40mm/px · 6 of 512 frames shown]
[frame 43/512]
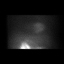
[frame 128/512]
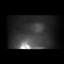
[frame 214/512]
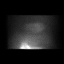
[frame 299/512]
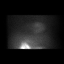
[frame 384/512]
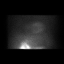
[frame 470/512]
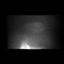

[Series 2: wbr_s-proj_st wbr stress-gsp · 6.40mm/px · 6 of 512 frames shown]
[frame 43/512]
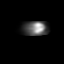
[frame 128/512]
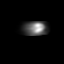
[frame 214/512]
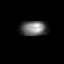
[frame 299/512]
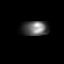
[frame 384/512]
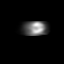
[frame 470/512]
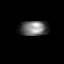

[Series 3: wbr_s-proj_st wbr stress-sum-em · 6.40mm/px · 6 of 64 frames shown]
[frame 6/64]
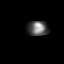
[frame 16/64]
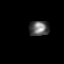
[frame 27/64]
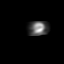
[frame 38/64]
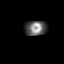
[frame 48/64]
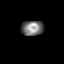
[frame 59/64]
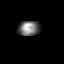

[Series 3: wbr stress-sum-em · 6.40mm/px · 6 of 64 frames shown]
[frame 6/64]
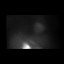
[frame 16/64]
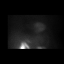
[frame 27/64]
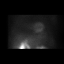
[frame 38/64]
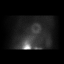
[frame 48/64]
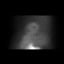
[frame 59/64]
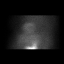

[36 of 36 positions shown; findings below may reference images not displayed]

Canned report from images found in remote index.

Refer to host system for actual result text.

## 2017-11-13 DIAGNOSIS — R69 Illness, unspecified: Secondary | ICD-10-CM | POA: Diagnosis not present

## 2017-11-30 DIAGNOSIS — Z6833 Body mass index (BMI) 33.0-33.9, adult: Secondary | ICD-10-CM | POA: Diagnosis not present

## 2017-11-30 DIAGNOSIS — E669 Obesity, unspecified: Secondary | ICD-10-CM | POA: Diagnosis not present

## 2017-11-30 DIAGNOSIS — M791 Myalgia, unspecified site: Secondary | ICD-10-CM | POA: Diagnosis not present

## 2017-11-30 DIAGNOSIS — G894 Chronic pain syndrome: Secondary | ICD-10-CM | POA: Diagnosis not present

## 2017-11-30 DIAGNOSIS — M150 Primary generalized (osteo)arthritis: Secondary | ICD-10-CM | POA: Diagnosis not present

## 2017-11-30 DIAGNOSIS — I2510 Atherosclerotic heart disease of native coronary artery without angina pectoris: Secondary | ICD-10-CM | POA: Diagnosis not present

## 2017-12-11 DIAGNOSIS — E559 Vitamin D deficiency, unspecified: Secondary | ICD-10-CM | POA: Diagnosis not present

## 2017-12-11 DIAGNOSIS — E1165 Type 2 diabetes mellitus with hyperglycemia: Secondary | ICD-10-CM | POA: Diagnosis not present

## 2017-12-11 DIAGNOSIS — Z794 Long term (current) use of insulin: Secondary | ICD-10-CM | POA: Diagnosis not present

## 2017-12-13 DIAGNOSIS — E11618 Type 2 diabetes mellitus with other diabetic arthropathy: Secondary | ICD-10-CM | POA: Diagnosis not present

## 2017-12-13 DIAGNOSIS — R69 Illness, unspecified: Secondary | ICD-10-CM | POA: Diagnosis not present

## 2017-12-13 DIAGNOSIS — I10 Essential (primary) hypertension: Secondary | ICD-10-CM | POA: Diagnosis not present

## 2017-12-13 DIAGNOSIS — E782 Mixed hyperlipidemia: Secondary | ICD-10-CM | POA: Diagnosis not present

## 2017-12-13 DIAGNOSIS — Z794 Long term (current) use of insulin: Secondary | ICD-10-CM | POA: Diagnosis not present

## 2018-01-08 DIAGNOSIS — R69 Illness, unspecified: Secondary | ICD-10-CM | POA: Diagnosis not present

## 2018-01-09 DIAGNOSIS — D225 Melanocytic nevi of trunk: Secondary | ICD-10-CM | POA: Diagnosis not present

## 2018-01-09 DIAGNOSIS — L700 Acne vulgaris: Secondary | ICD-10-CM | POA: Diagnosis not present

## 2018-01-09 DIAGNOSIS — L814 Other melanin hyperpigmentation: Secondary | ICD-10-CM | POA: Diagnosis not present

## 2018-01-09 DIAGNOSIS — L821 Other seborrheic keratosis: Secondary | ICD-10-CM | POA: Diagnosis not present

## 2018-01-09 DIAGNOSIS — L820 Inflamed seborrheic keratosis: Secondary | ICD-10-CM | POA: Diagnosis not present

## 2018-01-09 DIAGNOSIS — D1801 Hemangioma of skin and subcutaneous tissue: Secondary | ICD-10-CM | POA: Diagnosis not present

## 2018-02-09 DIAGNOSIS — G4733 Obstructive sleep apnea (adult) (pediatric): Secondary | ICD-10-CM | POA: Diagnosis not present

## 2018-03-06 DIAGNOSIS — R69 Illness, unspecified: Secondary | ICD-10-CM | POA: Diagnosis not present

## 2018-03-06 DIAGNOSIS — I10 Essential (primary) hypertension: Secondary | ICD-10-CM | POA: Diagnosis not present

## 2018-03-06 DIAGNOSIS — Z794 Long term (current) use of insulin: Secondary | ICD-10-CM | POA: Diagnosis not present

## 2018-03-06 DIAGNOSIS — E11618 Type 2 diabetes mellitus with other diabetic arthropathy: Secondary | ICD-10-CM | POA: Diagnosis not present

## 2018-03-06 DIAGNOSIS — E782 Mixed hyperlipidemia: Secondary | ICD-10-CM | POA: Diagnosis not present

## 2018-03-07 DIAGNOSIS — E559 Vitamin D deficiency, unspecified: Secondary | ICD-10-CM | POA: Diagnosis not present

## 2018-03-07 DIAGNOSIS — R69 Illness, unspecified: Secondary | ICD-10-CM | POA: Diagnosis not present

## 2018-03-07 DIAGNOSIS — I2510 Atherosclerotic heart disease of native coronary artery without angina pectoris: Secondary | ICD-10-CM | POA: Diagnosis not present

## 2018-03-07 DIAGNOSIS — E782 Mixed hyperlipidemia: Secondary | ICD-10-CM | POA: Diagnosis not present

## 2018-03-07 DIAGNOSIS — R32 Unspecified urinary incontinence: Secondary | ICD-10-CM | POA: Diagnosis not present

## 2018-03-07 DIAGNOSIS — E1165 Type 2 diabetes mellitus with hyperglycemia: Secondary | ICD-10-CM | POA: Diagnosis not present

## 2018-03-07 DIAGNOSIS — E1159 Type 2 diabetes mellitus with other circulatory complications: Secondary | ICD-10-CM | POA: Diagnosis not present

## 2018-03-07 DIAGNOSIS — M25569 Pain in unspecified knee: Secondary | ICD-10-CM | POA: Diagnosis not present

## 2018-03-07 DIAGNOSIS — I10 Essential (primary) hypertension: Secondary | ICD-10-CM | POA: Diagnosis not present

## 2018-03-07 DIAGNOSIS — Q600 Renal agenesis, unilateral: Secondary | ICD-10-CM | POA: Diagnosis not present

## 2018-04-16 DIAGNOSIS — R69 Illness, unspecified: Secondary | ICD-10-CM | POA: Diagnosis not present

## 2018-04-23 DIAGNOSIS — Z794 Long term (current) use of insulin: Secondary | ICD-10-CM | POA: Diagnosis not present

## 2018-04-23 DIAGNOSIS — Z6835 Body mass index (BMI) 35.0-35.9, adult: Secondary | ICD-10-CM | POA: Diagnosis not present

## 2018-04-23 DIAGNOSIS — E1165 Type 2 diabetes mellitus with hyperglycemia: Secondary | ICD-10-CM | POA: Diagnosis not present

## 2018-04-23 DIAGNOSIS — Z720 Tobacco use: Secondary | ICD-10-CM | POA: Diagnosis not present

## 2018-04-23 DIAGNOSIS — I2510 Atherosclerotic heart disease of native coronary artery without angina pectoris: Secondary | ICD-10-CM | POA: Diagnosis not present

## 2018-04-24 DIAGNOSIS — R69 Illness, unspecified: Secondary | ICD-10-CM | POA: Diagnosis not present

## 2018-05-17 DIAGNOSIS — E669 Obesity, unspecified: Secondary | ICD-10-CM | POA: Diagnosis not present

## 2018-05-17 DIAGNOSIS — E1165 Type 2 diabetes mellitus with hyperglycemia: Secondary | ICD-10-CM | POA: Diagnosis not present

## 2018-05-17 DIAGNOSIS — I2510 Atherosclerotic heart disease of native coronary artery without angina pectoris: Secondary | ICD-10-CM | POA: Diagnosis not present

## 2018-05-17 DIAGNOSIS — Z720 Tobacco use: Secondary | ICD-10-CM | POA: Diagnosis not present

## 2018-05-17 DIAGNOSIS — Z794 Long term (current) use of insulin: Secondary | ICD-10-CM | POA: Diagnosis not present

## 2018-05-24 ENCOUNTER — Encounter (INDEPENDENT_AMBULATORY_CARE_PROVIDER_SITE_OTHER): Payer: BC Managed Care – PPO

## 2018-05-28 ENCOUNTER — Encounter (INDEPENDENT_AMBULATORY_CARE_PROVIDER_SITE_OTHER): Payer: Self-pay | Admitting: Family Medicine

## 2018-05-28 ENCOUNTER — Ambulatory Visit (INDEPENDENT_AMBULATORY_CARE_PROVIDER_SITE_OTHER): Payer: Medicare HMO | Admitting: Family Medicine

## 2018-05-28 VITALS — BP 134/75 | HR 84 | Temp 97.5°F | Ht 61.0 in | Wt 180.0 lb

## 2018-05-28 DIAGNOSIS — I25118 Atherosclerotic heart disease of native coronary artery with other forms of angina pectoris: Secondary | ICD-10-CM

## 2018-05-28 DIAGNOSIS — R0602 Shortness of breath: Secondary | ICD-10-CM

## 2018-05-28 DIAGNOSIS — Z9189 Other specified personal risk factors, not elsewhere classified: Secondary | ICD-10-CM | POA: Diagnosis not present

## 2018-05-28 DIAGNOSIS — E669 Obesity, unspecified: Secondary | ICD-10-CM

## 2018-05-28 DIAGNOSIS — Z6834 Body mass index (BMI) 34.0-34.9, adult: Secondary | ICD-10-CM

## 2018-05-28 DIAGNOSIS — E119 Type 2 diabetes mellitus without complications: Secondary | ICD-10-CM | POA: Diagnosis not present

## 2018-05-28 DIAGNOSIS — E559 Vitamin D deficiency, unspecified: Secondary | ICD-10-CM | POA: Diagnosis not present

## 2018-05-28 DIAGNOSIS — Z1331 Encounter for screening for depression: Secondary | ICD-10-CM | POA: Diagnosis not present

## 2018-05-28 DIAGNOSIS — R5383 Other fatigue: Secondary | ICD-10-CM | POA: Diagnosis not present

## 2018-05-28 DIAGNOSIS — Z794 Long term (current) use of insulin: Secondary | ICD-10-CM | POA: Diagnosis not present

## 2018-05-28 DIAGNOSIS — Z0289 Encounter for other administrative examinations: Secondary | ICD-10-CM

## 2018-05-29 LAB — VITAMIN D 25 HYDROXY (VIT D DEFICIENCY, FRACTURES): Vit D, 25-Hydroxy: 72.9 ng/mL (ref 30.0–100.0)

## 2018-05-29 LAB — COMPREHENSIVE METABOLIC PANEL
ALBUMIN: 4.5 g/dL (ref 3.5–4.8)
ALK PHOS: 83 IU/L (ref 39–117)
ALT: 28 IU/L (ref 0–32)
AST: 28 IU/L (ref 0–40)
Albumin/Globulin Ratio: 1.9 (ref 1.2–2.2)
BUN / CREAT RATIO: 16 (ref 12–28)
BUN: 14 mg/dL (ref 8–27)
Bilirubin Total: 0.6 mg/dL (ref 0.0–1.2)
CO2: 24 mmol/L (ref 20–29)
CREATININE: 0.87 mg/dL (ref 0.57–1.00)
Calcium: 9.6 mg/dL (ref 8.7–10.3)
Chloride: 102 mmol/L (ref 96–106)
GFR calc Af Amer: 76 mL/min/{1.73_m2} (ref >59)
GFR calc non Af Amer: 66 mL/min/{1.73_m2} (ref >59)
GLOBULIN, TOTAL: 2.4 g/dL (ref 1.5–4.5)
Glucose: 96 mg/dL (ref 65–99)
Potassium: 4.4 mmol/L (ref 3.5–5.2)
SODIUM: 143 mmol/L (ref 134–144)
Total Protein: 6.9 g/dL (ref 6.0–8.5)

## 2018-05-29 LAB — C-PEPTIDE: C PEPTIDE: 2.2 ng/mL (ref 1.1–4.4)

## 2018-05-29 NOTE — Progress Notes (Signed)
.  Office: [phone]  /  Fax: [phone]   HPI:   Chief Complaint: Sandra (MR# [id number]) is a 72 y.o. female who presents on 05/29/2018 for obesity evaluation and treatment. Current BMI is Body mass index is 34.01 kg/m.Sandra [name] has struggled with obesity for years and has been unsuccessful in either losing weight or maintaining long term weight loss. Sandra [name] attended our information session and states she is currently in the action stage of change and ready to dedicate time achieving and maintaining a healthier weight.  Sandra [name] states her family eats meals together she thinks her family will eat healthier with  her her desired weight loss is 18 to 23 lbs she started gaining weight in college her heaviest weight ever was 207 lbs. she has significant food cravings issues  she snacks frequently in the evenings she struggles with emotional eating    Fatigue Sandra [name] feels her energy is lower than it should be. This has worsened with weight gain and has not worsened recently. Sandra [name] admits to daytime somnolence and admits to waking up still tired. Patient is at risk for obstructive sleep apnea. Patent has a history of symptoms of daytime fatigue and morning fatigue. Patient generally gets 6 to 8 hours of sleep per night, and states they generally have generally restful sleep. Snoring is present. Apneic episodes are not present. Epworth Sleepiness Score is 5  EKG was ordered today and shows poor R wave progression.  Dyspnea on exertion Sandra [name] notes increasing shortness of breath with exercising and seems to be worsening over time with weight gain. She notes getting out of breath sooner with activity than she used to. This has not gotten worse recently. EKG was ordered today and shows poor R wave progression. Sandra [name] denies orthopnea.  Vitamin D deficiency Sandra [name] has a diagnosis of vitamin D deficiency. Sandra [name] is currently taking OTC vit D 800 IU two times daily and she denies  nausea, vomiting or muscle weakness.  At risk for osteopenia and osteoporosis Sandra [name] is at higher risk of osteopenia and osteoporosis due to vitamin D deficiency.   Diabetes II Sandra [name] has a diagnosis of diabetes type II. Sandra [name] states fasting BGs range between 103 and 130 and she denies any hypoglycemic episodes. Sandra [name] was started on insulin approximately two months ago and her last A1c was at 8.2 on 03/06/18. She is on Jardiance, Victoza and Lantus. She is attempting to work on intensive lifestyle modifications including diet, exercise, and weight loss to help control her blood glucose levels.  Coronary Artery Disease Sandra [name] has a diagnosis of coronary artery disease and she sees Dr. [place]. She is due for her next appointment. Her last LDL was at 120 and her last HDL was at 41. Her last triglyceride level was at 188.  Depression Screen Sandra [name]'s Food and Mood (modified PHQ-9) score was  Depression screen PHQ 2/9 05/28/2018  Decreased Interest 1  Down, Depressed, Hopeless 1  PHQ - 2 Score 2  Altered sleeping 1  Tired, decreased energy 2  Change in appetite 1  Feeling bad or failure about yourself  1  Trouble concentrating 0  Moving slowly or fidgety/restless 1  Suicidal thoughts 0  PHQ-9 Score 8  Difficult doing work/chores Not difficult at all    ALLERGIES: Allergies  Allergen Reactions  . Erythromycin   . Epinephrine Palpitations  . Metformin And Related Rash    MEDICATIONS: Current Outpatient Medications on File Prior to Visit  Medication Sig Dispense Refill  .  amitriptyline (ELAVIL) 25 MG tablet Take one to three (1-3) tablets (25 mg to 75 mg total) by mouth twice daily as needed for back pain.    Marland Kitchen aspirin EC 81 MG tablet Take 1 tablet (81 mg total) by mouth daily. 90 tablet 3  . BIOTIN PO Take by mouth.    Marland Kitchen CALCIUM PO Take by mouth daily. Reported on 02/24/2016    . cetirizine (ZYRTEC) 10 MG tablet Take 10 mg by mouth daily.    . Cinnamon 500 MG capsule Take 500 mg by  mouth daily.    . diclofenac sodium (VOLTAREN) 1 % GEL Apply topically 4 (four) times daily.    . empagliflozin (JARDIANCE) 25 MG TABS tablet Take 25 mg by mouth daily.    . fluticasone (FLONASE) 50 MCG/ACT nasal spray Place 2 sprays into the [name] daily.    Marland Kitchen guaiFENesin (MUCINEX) 600 MG 12 hr tablet Take 600 mg by mouth daily.      Marland Kitchen ibuprofen (ADVIL,MOTRIN) 100 MG tablet Take 100 mg by mouth every 6 (six) hours as needed.      . insulin glargine (LANTUS) 100 UNIT/ML injection Inject 25 Units into the skin daily.    Marland Kitchen liraglutide (VICTOZA) 18 MG/3ML SOPN Inject 1.8 mg into the skin daily.    Marland Kitchen lisinopril (PRINIVIL,ZESTRIL) 2.5 MG tablet Take 2.5 mg by mouth daily.    . metoprolol (TOPROL-XL) 50 MG 24 hr tablet Take 25 mg by mouth daily.     . Multiple Vitamin (MULTIVITAMIN) tablet Take 1 tablet by mouth daily. Reported on 02/24/2016    . oxybutynin (DITROPAN-XL) 10 MG 24 hr tablet Take 10 mg by mouth at bedtime.    . rosuvastatin (CRESTOR) 10 MG tablet Take 10 mg by mouth daily.    Marland Kitchen tiZANidine (ZANAFLEX) 4 MG tablet Take 4 mg by mouth every 6 (six) hours as needed for muscle spasms (back pain).    Marland Kitchen VITAMIN D, CHOLECALCIFEROL, PO Take 2,000 mg by mouth daily.      No current facility-administered medications on file prior to visit.     PAST MEDICAL HISTORY: Past Medical History:  Diagnosis Date  . Arthritis   . Back pain   . Chronic kidney disease   . Congenital absence of left kidney   . Congenital absence of left ovary   . Coronary artery disease 2004   STATUS POST STENTING OF THE RIGHT CORONARY OSTIUM   . DDD (degenerative disc disease)   . Diabetes mellitus without complication (Sandra [name])   . Dislocated shoulder    left  . DVT (deep venous thrombosis) ([facility])    left posterior tibial vein  . High blood pressure   . Hypercholesterolemia   . Joint pain   . Osteoarthritis   . Peripheral vascular disease ([place])   . Sleep apnea   . Sleep apnea   . SOB (shortness of breath)   .  Tobacco dependence   . Vitamin D deficiency     PAST SURGICAL HISTORY: Past Surgical History:  Procedure Laterality Date  . ANGIOPLASTY  2004  . bone spur shoulder    . FOOT SURGERY     plantar fascitis  . KNEE SURGERY  1950  . TOTAL ABDOMINAL HYSTERECTOMY    . TUBAL LIGATION    . VEIN BYPASS SURGERY     2014    SOCIAL HISTORY: Social History   Tobacco Use  . Smoking status: Former Smoker    Packs/day: 1.00    Last attempt  to quit: 10/27/2015    Years since quitting: 2.5  . Smokeless tobacco: Never Used  Substance Use Topics  . Alcohol use: No    Alcohol/week: 0.0 standard drinks  . Drug use: No    FAMILY HISTORY: Family History  Problem Relation Age of Onset  . Leukemia Sister   . Hypertension Brother   . Heart disease Father        before age 35  . High blood pressure Father   . Sudden death Father     ROS: Review of Systems  Constitutional: Positive for malaise/fatigue.  HENT:       + Dry Mouth  Eyes:       + Vision Changes + Wear Glasses or Contacts  Respiratory: Positive for cough and shortness of breath.   Cardiovascular: Negative for orthopnea.       + Shortness of Breath with activity  Gastrointestinal: Negative for nausea and vomiting.  Genitourinary: Positive for frequency.  Musculoskeletal: Positive for back pain.       + Neck Lumps + Neck Stiffness + Muscle or Joint Pain + Muscle Stiffness + Red or Swollen Joints Negative for muscle weakness  Skin:       + Hair or Nail Changes     PHYSICAL EXAM: Blood pressure 134/75, pulse 84, temperature (!) 97.5 F (36.4 C), temperature source Oral, height 5\' 1"  (1.549 m), weight 180 lb (81.6 kg), SpO2 94 %. Body mass index is 34.01 kg/m. Physical Exam  Constitutional: She is oriented to person, place, and time. She appears well-developed and well-nourished.  HENT:  Head: Normocephalic and atraumatic.  [name]: [name] normal.  Eyes: EOM are normal. No scleral icterus.  Neck: Normal range of  motion. Neck supple. No thyromegaly present.  Cardiovascular: Normal rate and regular rhythm.  Pulmonary/Chest: Effort normal. No respiratory distress.  Abdominal: Soft. There is no tenderness.  + Obesity  Musculoskeletal: Normal range of motion.  Rnage of Motion normal in all 4 extremities  Neurological: She is alert and oriented to person, place, and time. Coordination normal.  Skin: Skin is warm and dry.  Psychiatric: She has a normal mood and affect. Her behavior is normal.  Vitals reviewed.   RECENT LABS AND TESTS: BMET    Component Value Date/Time   NA 143 05/28/2018 1200   K 4.4 05/28/2018 1200   CL 102 05/28/2018 1200   CO2 24 05/28/2018 1200   GLUCOSE 96 05/28/2018 1200   GLUCOSE 115 (H) 10/14/2011 0931   BUN 14 05/28/2018 1200   CREATININE 0.87 05/28/2018 1200   CALCIUM 9.6 05/28/2018 1200   GFRNONAA 66 05/28/2018 1200   GFRAA 76 05/28/2018 1200   No results found for: HGBA1C No results found for: INSULIN CBC No results found for: WBC, RBC, HGB, HCT, PLT, MCV, MCH, MCHC, RDW, LYMPHSABS, MONOABS, EOSABS, BASOSABS Iron/TIBC/Ferritin/ %Sat No results found for: IRON, TIBC, FERRITIN, IRONPCTSAT Lipid Panel     Component Value Date/Time   CHOL 152 10/14/2011 0931   TRIG 108.0 10/14/2011 0931   HDL 43.90 10/14/2011 0931   CHOLHDL 3 10/14/2011 0931   VLDL 21.6 10/14/2011 0931   LDLCALC 87 10/14/2011 0931   Hepatic Function Panel     Component Value Date/Time   PROT 6.9 05/28/2018 1200   ALBUMIN 4.5 05/28/2018 1200   AST 28 05/28/2018 1200   ALT 28 05/28/2018 1200   ALKPHOS 83 05/28/2018 1200   BILITOT 0.6 05/28/2018 1200   BILIDIR 0.0 10/14/2011 0931   No results  found for: TSH Vitamin D There are no recent lab results  ECG  shows NSR with a rate of 86 BPM INDIRECT CALORIMETER done today shows a VO2 of 189 and a REE of 1314. Her calculated basal metabolic rate is 3976 thus her basal metabolic rate is worse than expected.    ASSESSMENT AND  PLAN: Other fatigue - Plan: EKG 12-Lead  Shortness of breath on exertion  Type 2 diabetes mellitus without complication, with long-term current use of insulin ([facility]CC) - Plan: Comprehensive metabolic panel, C-peptide  Vitamin D deficiency - Plan: VITAMIN D 25 Hydroxy (Vit-D Deficiency, Fractures)  Coronary artery disease involving native coronary artery of native heart with other form of angina pectoris ([place])  Depression screening  At risk for osteoporosis  Class 1 obesity with serious comorbidity and body mass index (BMI) of 34.0 to 34.9 in adult, unspecified obesity type  PLAN:  Fatigue Sandra [name] was informed that her fatigue may be related to obesity, depression or many other causes. Labs will be ordered, and in the meanwhile Sandra [name] has agreed to work on diet, exercise and weight loss to help with fatigue. Proper sleep hygiene was discussed including the need for 7-8 hours of quality sleep each night. A sleep study was not ordered based on symptoms and Epworth score. We will order Indirect Calorimetry and EKG today.  Dyspnea on exertion Sandra [name]'s shortness of breath appears to be obesity related and exercise induced. She has agreed to work on weight loss and gradually increase exercise to treat her exercise induced shortness of breath. If Amita follows our instructions and loses weight without improvement of her shortness of breath, we will plan to refer to pulmonology.We will order Indirect Calorimetry, EKG and labs today. We will monitor this condition regularly. Carriann agrees to this plan.  Vitamin D Deficiency Sandra [name] was informed that low vitamin D levels contributes to fatigue and are associated with obesity, breast, and colon cancer. She agrees to continue to take OTC Vit D @800  IU twice daily and will follow up for routine testing of vitamin D, at least 2-3 times per year. She was informed of the risk of over-replacement of vitamin D and agrees to not increase her dose unless she  discusses this with [name] first. We will check vitamin D level today and Sandra [name] will follow up as directed.  At risk for osteopenia and osteoporosis Sandra [name] was given extended  (15 minutes) osteoporosis prevention counseling today. Sandra [name] is at risk for osteopenia and osteoporosis due to her vitamin D deficiency. She was encouraged to take her vitamin D and follow her higher calcium diet and increase strengthening exercise to help strengthen her bones and decrease her risk of osteopenia and osteoporosis.  Diabetes II Sandra [name] has been given extensive diabetes education by myself today including ideal fasting and post-prandial blood glucose readings, individual ideal Hgb A1c goals and hypoglycemia protocol. We discussed the importance of good blood sugar control to decrease the likelihood of diabetic complications such as nephropathy, neuropathy, limb loss, blindness, coronary artery disease, and death. We discussed the importance of intensive lifestyle modification including diet, exercise and weight loss as the first line treatment for diabetes. We will check C-Peptide today. Sandra [name] agrees to continue her diabetes medications and will follow up at the agreed upon time.  Coronary Artery Disease Sandra [name] will follow up with cardiology and she will follow up with our clinic at the agreed upon time.  Depression Screen Sandra [name] had a mildly positive depression screening. Depression is commonly associated with  obesity and often results in emotional eating behaviors. We will monitor this closely and work on CBT to help improve the non-hunger eating patterns. Referral to Psychology may be required if no improvement is seen as she continues in our clinic.  Obesity Sandra [name] is currently in the action stage of change and her goal is to continue with weight loss efforts She has agreed to follow the Category 2 plan Sandra [name] has been instructed to work up to a goal of 150 minutes of combined cardio and strengthening exercise  per week for weight loss and overall health benefits. We discussed the following Behavioral Modification Strategies today: planning for success, increasing lean protein intake, increasing vegetables and work on meal planning and easy cooking plans  Muriel has agreed to follow up with our clinic in 2 weeks. She was informed of the importance of frequent follow up visits to maximize her success with intensive lifestyle modifications for her multiple health conditions. She was informed we would discuss her lab results at her next visit unless there is a critical issue that needs to be addressed sooner. Naila agreed to keep her next visit at the agreed upon time to discuss these results.    OBESITY BEHAVIORAL INTERVENTION VISIT  Today's visit was # 1   Starting weight: 180 lbs Starting date: 05/28/18 Today's weight : 180 lbs Today's date: 05/28/2018 Total lbs lost to date: 0   ASK: We discussed the diagnosis of obesity with [name] today and Shakeya agreed to give [name] permission to discuss obesity behavioral modification therapy today.  ASSESS: Keeva has the diagnosis of obesity and her BMI today is 34.03 Jakaila is in the action stage of change   ADVISE: Mattison was educated on the multiple health risks of obesity as well as the benefit of weight loss to improve her health. She was advised of the need for long term treatment and the importance of lifestyle modifications to improve her current health and to decrease her risk of future health problems.  AGREE: Multiple dietary modification options and treatment options were discussed and  Peg agreed to follow the recommendations documented in the above note.  ARRANGE: Yenty was educated on the importance of frequent visits to treat obesity as outlined per CMS and USPSTF guidelines and agreed to schedule her next follow up appointment today.   I, Doreene Nest, am acting as transcriptionist for Eber Jones, MD   I  have reviewed the above documentation for accuracy and completeness, and I agree with the above. - Ilene Qua, MD

## 2018-05-30 DIAGNOSIS — G4733 Obstructive sleep apnea (adult) (pediatric): Secondary | ICD-10-CM | POA: Diagnosis not present

## 2018-05-31 DIAGNOSIS — K5730 Diverticulosis of large intestine without perforation or abscess without bleeding: Secondary | ICD-10-CM | POA: Diagnosis not present

## 2018-05-31 DIAGNOSIS — D122 Benign neoplasm of ascending colon: Secondary | ICD-10-CM | POA: Diagnosis not present

## 2018-05-31 DIAGNOSIS — Z86010 Personal history of colonic polyps: Secondary | ICD-10-CM | POA: Diagnosis not present

## 2018-05-31 DIAGNOSIS — K648 Other hemorrhoids: Secondary | ICD-10-CM | POA: Diagnosis not present

## 2018-06-05 DIAGNOSIS — D122 Benign neoplasm of ascending colon: Secondary | ICD-10-CM | POA: Diagnosis not present

## 2018-06-11 DIAGNOSIS — R69 Illness, unspecified: Secondary | ICD-10-CM | POA: Diagnosis not present

## 2018-06-12 ENCOUNTER — Ambulatory Visit (INDEPENDENT_AMBULATORY_CARE_PROVIDER_SITE_OTHER): Payer: Medicare HMO | Admitting: Bariatrics

## 2018-06-12 VITALS — BP 111/66 | HR 81 | Temp 97.9°F | Ht 61.0 in | Wt 175.0 lb

## 2018-06-12 DIAGNOSIS — E119 Type 2 diabetes mellitus without complications: Secondary | ICD-10-CM | POA: Diagnosis not present

## 2018-06-12 DIAGNOSIS — E559 Vitamin D deficiency, unspecified: Secondary | ICD-10-CM

## 2018-06-12 DIAGNOSIS — E7849 Other hyperlipidemia: Secondary | ICD-10-CM

## 2018-06-12 DIAGNOSIS — R69 Illness, unspecified: Secondary | ICD-10-CM | POA: Diagnosis not present

## 2018-06-12 DIAGNOSIS — Z6833 Body mass index (BMI) 33.0-33.9, adult: Secondary | ICD-10-CM

## 2018-06-12 DIAGNOSIS — Z794 Long term (current) use of insulin: Secondary | ICD-10-CM | POA: Diagnosis not present

## 2018-06-12 DIAGNOSIS — E669 Obesity, unspecified: Secondary | ICD-10-CM

## 2018-06-13 NOTE — Progress Notes (Signed)
Office: [phone]  /  Fax: [phone]   HPI:   Chief Complaint: OBESITY Sandra [name] is here to discuss her progress with her obesity treatment plan. She is on the Category 2 plan and is following her eating plan approximately 90 % of the time. She states she is walking 7,000-8,000 steps 5 times per week. Sandra [name] struggles with plan. She states her hunger is control, but notes some cravings for ice cream at night.  Her weight is 175 lb (79.4 kg) today and has had a weight loss of 5 pounds over a period of 2 weeks since her last visit. She has lost 5 lbs since starting treatment with [name].  Vitamin D Deficiency Sandra [name] has a diagnosis of vitamin D deficiency. She is taking OTC Vit D 5,000 IU daily. Last Vit D level of 72.9 on 05/28/18. She denies nausea, vomiting or muscle weakness.  Hyperlipidemia Sandra [name] has hyperlipidemia and has been trying to improve her cholesterol levels with intensive lifestyle modification including a low saturated fat diet, exercise and weight loss. She is on Crestor and having labs done at her primary care physician. She denies any chest pain, claudication, but notes some myalgias.  Diabetes II Sandra [name] has a diagnosis of diabetes type II. Sandra [name] is taking Victoza 1.8 mg, Jardiance 25 mg, and Basaglar 25 units. She states fasting BGs occasionally low to low 80's, rare hypoglycemia. She has been working on intensive lifestyle modifications including diet, exercise, and weight loss to help control her blood glucose levels.  ALLERGIES: Allergies  Allergen Reactions  . Erythromycin   . Epinephrine Palpitations  . Metformin And Related Rash    MEDICATIONS: Current Outpatient Medications on File Prior to Visit  Medication Sig Dispense Refill  . amitriptyline (ELAVIL) 25 MG tablet Take one to three (1-3) tablets (25 mg to 75 mg total) by mouth twice daily as needed for back pain.    Marland Kitchen aspirin EC 81 MG tablet Take 1 tablet (81 mg total) by mouth daily. 90 tablet 3  .  BIOTIN PO Take by mouth.    Marland Kitchen CALCIUM PO Take by mouth daily. Reported on 02/24/2016    . cetirizine (ZYRTEC) 10 MG tablet Take 10 mg by mouth daily.    . Cinnamon 500 MG capsule Take 500 mg by mouth daily.    . diclofenac sodium (VOLTAREN) 1 % GEL Apply topically 4 (four) times daily.    . empagliflozin (JARDIANCE) 25 MG TABS tablet Take 25 mg by mouth daily.    . fluticasone (FLONASE) 50 MCG/ACT nasal spray Place 2 sprays into the nose daily.    Marland Kitchen guaiFENesin (MUCINEX) 600 MG 12 hr tablet Take 600 mg by mouth daily.      Marland Kitchen ibuprofen (ADVIL,MOTRIN) 100 MG tablet Take 100 mg by mouth every 6 (six) hours as needed.      . liraglutide (VICTOZA) 18 MG/3ML SOPN Inject 1.8 mg into the skin daily.    Marland Kitchen lisinopril (PRINIVIL,ZESTRIL) 2.5 MG tablet Take 2.5 mg by mouth daily.    . metoprolol (TOPROL-XL) 50 MG 24 hr tablet Take 25 mg by mouth daily.     . Multiple Vitamin (MULTIVITAMIN) tablet Take 1 tablet by mouth daily. Reported on 02/24/2016    . oxybutynin (DITROPAN-XL) 10 MG 24 hr tablet Take 10 mg by mouth at bedtime.    . rosuvastatin (CRESTOR) 10 MG tablet Take 10 mg by mouth daily.    Marland Kitchen tiZANidine (ZANAFLEX) 4 MG tablet Take 4 mg by mouth every 6 (six)  hours as needed for muscle spasms (back pain).    Marland Kitchen VITAMIN D, CHOLECALCIFEROL, PO Take 2,000 mg by mouth daily.      No current facility-administered medications on file prior to visit.     PAST MEDICAL HISTORY: Past Medical History:  Diagnosis Date  . Arthritis   . Back pain   . Chronic kidney disease   . Congenital absence of left kidney   . Congenital absence of left ovary   . Coronary artery disease 2004   STATUS POST STENTING OF THE RIGHT CORONARY OSTIUM   . DDD (degenerative disc disease)   . Diabetes mellitus without complication ([place])   . Dislocated shoulder    left  . DVT (deep venous thrombosis) (HCC)    left posterior tibial vein  . High blood pressure   . Hypercholesterolemia   . Joint pain   . Osteoarthritis   .  Peripheral vascular disease ([place])   . Sleep apnea   . Sleep apnea   . SOB (shortness of breath)   . Tobacco dependence   . Vitamin D deficiency     PAST SURGICAL HISTORY: Past Surgical History:  Procedure Laterality Date  . ANGIOPLASTY  2004  . bone spur shoulder    . FOOT SURGERY     plantar fascitis  . KNEE SURGERY  1950  . TOTAL ABDOMINAL HYSTERECTOMY    . TUBAL LIGATION    . VEIN BYPASS SURGERY     2014    SOCIAL HISTORY: Social History   Tobacco Use  . Smoking status: Former Smoker    Packs/day: 1.00    Last attempt to quit: 10/27/2015    Years since quitting: 2.6  . Smokeless tobacco: Never Used  Substance Use Topics  . Alcohol use: No    Alcohol/week: 0.0 standard drinks  . Drug use: No    FAMILY HISTORY: Family History  Problem Relation Age of Onset  . Leukemia Sister   . Hypertension Brother   . Heart disease Father        before age 5  . High blood pressure Father   . Sudden death Father     ROS: Review of Systems  Constitutional: Positive for weight loss.  Cardiovascular: Negative for chest pain and claudication.  Gastrointestinal: Negative for nausea and vomiting.  Musculoskeletal: Positive for myalgias.       Negative muscle weakness  Endo/Heme/Allergies:       Positive hypoglycemia    PHYSICAL EXAM: Blood pressure 111/66, pulse 81, temperature 97.9 F (36.6 C), temperature source Oral, height 5\' 1"  (1.549 m), weight 175 lb (79.4 kg), SpO2 94 %. Body mass index is 33.07 kg/m. Physical Exam  Constitutional: She is oriented to person, place, and time. She appears well-developed and well-nourished.  Cardiovascular: Normal rate.  Pulmonary/Chest: Effort normal.  Musculoskeletal: Normal range of motion.  Neurological: She is oriented to person, place, and time.  Skin: Skin is warm and dry.  Psychiatric: She has a normal mood and affect. Her behavior is normal.  Vitals reviewed.   RECENT LABS AND TESTS: BMET    Component Value  Date/Time   NA 143 05/28/2018 1200   K 4.4 05/28/2018 1200   CL 102 05/28/2018 1200   CO2 24 05/28/2018 1200   GLUCOSE 96 05/28/2018 1200   GLUCOSE 115 (H) 10/14/2011 0931   BUN 14 05/28/2018 1200   CREATININE 0.87 05/28/2018 1200   CALCIUM 9.6 05/28/2018 1200   GFRNONAA 66 05/28/2018 1200   GFRAA 76  05/28/2018 1200   No results found for: HGBA1C No results found for: INSULIN CBC No results found for: WBC, RBC, HGB, HCT, PLT, MCV, MCH, MCHC, RDW, LYMPHSABS, MONOABS, EOSABS, BASOSABS Iron/TIBC/Ferritin/ %Sat No results found for: IRON, TIBC, FERRITIN, IRONPCTSAT Lipid Panel     Component Value Date/Time   CHOL 152 10/14/2011 0931   TRIG 108.0 10/14/2011 0931   HDL 43.90 10/14/2011 0931   CHOLHDL 3 10/14/2011 0931   VLDL 21.6 10/14/2011 0931   LDLCALC 87 10/14/2011 0931   Hepatic Function Panel     Component Value Date/Time   PROT 6.9 05/28/2018 1200   ALBUMIN 4.5 05/28/2018 1200   AST 28 05/28/2018 1200   ALT 28 05/28/2018 1200   ALKPHOS 83 05/28/2018 1200   BILITOT 0.6 05/28/2018 1200   BILIDIR 0.0 10/14/2011 0931   No results found for: TSH  Results for Tolin, Presly H "JUDY" (MRN [id number]) as of 06/13/2018 11:49  Ref. Range 05/28/2018 12:00  Vitamin D, 25-Hydroxy Latest Ref Range: 30.0 - 100.0 ng/mL 72.9   ASSESSMENT AND PLAN: Vitamin D deficiency  Other hyperlipidemia  Type 2 diabetes mellitus without complication, with long-term current use of insulin (HCC)  Class 1 obesity with serious comorbidity and body mass index (BMI) of 33.0 to 33.9 in adult, unspecified obesity type  PLAN:  Vitamin D Deficiency Zylah was informed that low vitamin D levels contributes to fatigue and are associated with obesity, breast, and colon cancer. Sandra [name] agrees to continue taking OTC Vit D at 5,000 IU daily and will follow up for routine testing of vitamin D, at least 2-3 times per year. She was informed of the risk of over-replacement of vitamin D and agrees to not increase  her dose unless she discusses this with [name] first. Sandra [name] agrees to follow up with our clinic in 2 weeks.  Hyperlipidemia Sandra [name] was informed of the American Heart Association Guidelines emphasizing intensive lifestyle modifications as the first line treatment for hyperlipidemia. We discussed many lifestyle modifications today in depth, and Kary will continue to work on decreasing saturated fats such as fatty red meat, butter and many fried foods. She will also increase vegetables and lean protein in her diet and continue to work on exercise and weight loss efforts. Sandra [name] is to consider CoQ10 OTC 200 mg daily and she will talk with her primary care physician concerning myalgias. Sandra [name] agrees to follow up with our clinic in 2 weeks.  Diabetes II Sandra [name] has been given extensive diabetes education by myself today including ideal fasting and post-prandial blood glucose readings, individual ideal Hgb A1c goals and hypoglycemia prevention. We discussed the importance of good blood sugar control to decrease the likelihood of diabetic complications such as nephropathy, neuropathy, limb loss, blindness, coronary artery disease, and death. We discussed the importance of intensive lifestyle modification including diet, exercise and weight loss as the first line treatment for diabetes. Sandra [name] agrees to continue her diabetes medications and if fasting BGs are between 80 and 120 for 3 days she is to decrease Basaglar by 3 units, and if fasting BGs are <80 for 3 days then decrease Basaglar by 5 units and call us; If fasting BGs are >120 no change in medications dosage is needed. Sandra [name] agrees to follow up with our clinic in 2 weeks.  Obesity Sandra [name] is currently in the action stage of change. As such, her goal is to continue with weight loss efforts She has agreed to follow the Category 2 plan Sandra [name] has been instructed to work  up to a goal of 150 minutes of combined cardio and strengthening exercise per week or  begain hard weights 5 lbs and walking 2 laps daily for weight loss and overall health benefits. We discussed the following Behavioral Modification Strategies today: increasing lean protein intake, decreasing simple carbohydrates, increasing vegetables, decrease eating out, work on meal planning and easy cooking plans, ways to avoid night time snacking, increase H20 intake, no skipping meals, and keeping healthy foods in the home We discussed appropriate snacks.  Sandra [name] has agreed to follow up with our clinic in 2 weeks. She was informed of the importance of frequent follow up visits to maximize her success with intensive lifestyle modifications for her multiple health conditions.   OBESITY BEHAVIORAL INTERVENTION VISIT  Today's visit was # 2   Starting weight: 180 lbs Starting date: 05/28/18 Today's weight : 175 lbs  Today's date: 06/12/2018 Total lbs lost to date: 5 At least 15 minutes were spent on discussing the following behavioral intervention visit.   ASK: We discussed the diagnosis of obesity with Sandra [name] today and Sandra [name] agreed to give [name] permission to discuss obesity behavioral modification therapy today.  ASSESS: Sandra [name] has the diagnosis of obesity and her BMI today is 33.08 Sandra [name] is in the action stage of change   ADVISE: Sandra [name] was educated on the multiple health risks of obesity as well as the benefit of weight loss to improve her health. She was advised of the need for long term treatment and the importance of lifestyle modifications to improve her current health and to decrease her risk of future health problems.  AGREE: Multiple dietary modification options and treatment options were discussed and  Sandra [name] agreed to follow the recommendations documented in the above note.  ARRANGE: Sandra [name] was educated on the importance of frequent visits to treat obesity as outlined per CMS and USPSTF guidelines and agreed to schedule her next follow up appointment today.  Sandra [name], am acting as transcriptionist for [employer], DO  I have reviewed the above documentation for accuracy and completeness, and I agree with the above. -Sandra Lesch, DO

## 2018-06-27 ENCOUNTER — Ambulatory Visit (INDEPENDENT_AMBULATORY_CARE_PROVIDER_SITE_OTHER): Payer: Medicare HMO | Admitting: Bariatrics

## 2018-06-27 VITALS — BP 105/71 | HR 87 | Temp 98.0°F | Ht 61.0 in | Wt 173.0 lb

## 2018-06-27 DIAGNOSIS — E119 Type 2 diabetes mellitus without complications: Secondary | ICD-10-CM

## 2018-06-27 DIAGNOSIS — Z6832 Body mass index (BMI) 32.0-32.9, adult: Secondary | ICD-10-CM | POA: Diagnosis not present

## 2018-06-27 DIAGNOSIS — E559 Vitamin D deficiency, unspecified: Secondary | ICD-10-CM | POA: Diagnosis not present

## 2018-06-27 DIAGNOSIS — F17200 Nicotine dependence, unspecified, uncomplicated: Secondary | ICD-10-CM | POA: Diagnosis not present

## 2018-06-27 DIAGNOSIS — Z794 Long term (current) use of insulin: Secondary | ICD-10-CM | POA: Diagnosis not present

## 2018-06-27 DIAGNOSIS — E669 Obesity, unspecified: Secondary | ICD-10-CM | POA: Diagnosis not present

## 2018-06-27 DIAGNOSIS — R69 Illness, unspecified: Secondary | ICD-10-CM | POA: Diagnosis not present

## 2018-06-27 MED ORDER — VARENICLINE TARTRATE 0.5 MG X 11 & 1 MG X 42 PO MISC: ORAL | 0 refills | Status: DC

## 2018-06-27 NOTE — Progress Notes (Deleted)
cha

## 2018-07-02 NOTE — Progress Notes (Signed)
Office: [phone]  /  Fax: [phone]   HPI:   Chief Complaint: OBESITY Sandra [name] is here to discuss her progress with her obesity treatment plan. She is on the Category 1 plan and is following her eating plan approximately 70 % of the time. She states she is walking 9,000 steps  5 times per week. Sandra [name] has been "traveling" and eating out more.  Her weight is 173 lb (78.5 kg) today and has had a weight loss of 2 pounds over a period of 2 weeks since her last visit. She has lost 7 lbs since starting treatment with [name].  Vitamin D deficiency Sandra [name] has a diagnosis of vitamin D deficiency. She is currently taking OTC vit D and her last vitamin D level was 72.9 on 05/28/18. She denies nausea, vomiting or muscle weakness.  Diabetes II Sandra [name] has a diagnosis of diabetes type II. Sandra [name] states that she has not had any significant low blood sugars. She is taking Victoza 1.8mg , Jardiance 25mg , and she has decreased Basaglar to 16 units from 25 units. She has been working on intensive lifestyle modifications including diet, exercise, and weight loss to help control her blood glucose levels.  Smoker Sandra [name] smokes less than 1 pack per day. She had quit for several years, but resumed smoking 1 month ago.  ALLERGIES: Allergies  Allergen Reactions  . Erythromycin   . Epinephrine Palpitations  . Metformin And Related Rash    MEDICATIONS: Current Outpatient Medications on File Prior to Visit  Medication Sig Dispense Refill  . amitriptyline (ELAVIL) 25 MG tablet Take one to three (1-3) tablets (25 mg to 75 mg total) by mouth twice daily as needed for back pain.    Marland Kitchen aspirin EC 81 MG tablet Take 1 tablet (81 mg total) by mouth daily. 90 tablet 3  . BIOTIN PO Take by mouth.    Marland Kitchen CALCIUM PO Take by mouth daily. Reported on 02/24/2016    . cetirizine (ZYRTEC) 10 MG tablet Take 10 mg by mouth daily.    . Cinnamon 500 MG capsule Take 500 mg by mouth daily.    . diclofenac sodium (VOLTAREN) 1 % GEL  Apply topically 4 (four) times daily.    . empagliflozin (JARDIANCE) 25 MG TABS tablet Take 25 mg by mouth daily.    . fluticasone (FLONASE) 50 MCG/ACT nasal spray Place 2 sprays into the nose daily.    Marland Kitchen guaiFENesin (MUCINEX) 600 MG 12 hr tablet Take 600 mg by mouth daily.      Marland Kitchen ibuprofen (ADVIL,MOTRIN) 100 MG tablet Take 100 mg by mouth every 6 (six) hours as needed.      . liraglutide (VICTOZA) 18 MG/3ML SOPN Inject 1.8 mg into the skin daily.    Marland Kitchen lisinopril (PRINIVIL,ZESTRIL) 2.5 MG tablet Take 2.5 mg by mouth daily.    . metoprolol (TOPROL-XL) 50 MG 24 hr tablet Take 25 mg by mouth daily.     . Multiple Vitamin (MULTIVITAMIN) tablet Take 1 tablet by mouth daily. Reported on 02/24/2016    . oxybutynin (DITROPAN-XL) 10 MG 24 hr tablet Take 10 mg by mouth at bedtime.    . rosuvastatin (CRESTOR) 10 MG tablet Take 10 mg by mouth daily.    Marland Kitchen tiZANidine (ZANAFLEX) 4 MG tablet Take 4 mg by mouth every 6 (six) hours as needed for muscle spasms (back pain).    Marland Kitchen VITAMIN D, CHOLECALCIFEROL, PO Take 2,000 mg by mouth daily.      No current facility-administered medications on file  prior to visit.     PAST MEDICAL HISTORY: Past Medical History:  Diagnosis Date  . Arthritis   . Back pain   . Chronic kidney disease   . Congenital absence of left kidney   . Congenital absence of left ovary   . Coronary artery disease 2004   STATUS POST STENTING OF THE RIGHT CORONARY OSTIUM   . DDD (degenerative disc disease)   . Diabetes mellitus without complication ([place])   . Dislocated shoulder    left  . DVT (deep venous thrombosis) (HCC)    left posterior tibial vein  . High blood pressure   . Hypercholesterolemia   . Joint pain   . Osteoarthritis   . Peripheral vascular disease ([place])   . Sleep apnea   . Sleep apnea   . SOB (shortness of breath)   . Tobacco dependence   . Vitamin D deficiency     PAST SURGICAL HISTORY: Past Surgical History:  Procedure Laterality Date  . ANGIOPLASTY  2004    . bone spur shoulder    . FOOT SURGERY     plantar fascitis  . KNEE SURGERY  1950  . TOTAL ABDOMINAL HYSTERECTOMY    . TUBAL LIGATION    . VEIN BYPASS SURGERY     2014    SOCIAL HISTORY: Social History   Tobacco Use  . Smoking status: Former Smoker    Packs/day: 1.00    Last attempt to quit: 10/27/2015    Years since quitting: 2.6  . Smokeless tobacco: Never Used  Substance Use Topics  . Alcohol use: No    Alcohol/week: 0.0 standard drinks  . Drug use: No    FAMILY HISTORY: Family History  Problem Relation Age of Onset  . Leukemia Sister   . Hypertension Brother   . Heart disease Father        before age 80  . High blood pressure Father   . Sudden death Father     ROS: Review of Systems  Constitutional: Positive for weight loss.  Gastrointestinal: Negative for nausea and vomiting.  Musculoskeletal:       Negative for muscle weakness.    PHYSICAL EXAM: Blood pressure 105/71, pulse 87, temperature 98 F (36.7 C), temperature source Oral, height 5\' 1"  (1.549 m), weight 173 lb (78.5 kg), SpO2 96 %. Body mass index is 32.69 kg/m. Physical Exam  Constitutional: She is oriented to person, place, and time. She appears well-developed and well-nourished.  Cardiovascular: Normal rate.  Pulmonary/Chest: Effort normal.  Musculoskeletal: Normal range of motion.  Neurological: She is oriented to person, place, and time.  Skin: Skin is warm and dry.  Psychiatric: She has a normal mood and affect. Her behavior is normal.  Vitals reviewed.   RECENT LABS AND TESTS: BMET    Component Value Date/Time   NA 143 05/28/2018 1200   K 4.4 05/28/2018 1200   CL 102 05/28/2018 1200   CO2 24 05/28/2018 1200   GLUCOSE 96 05/28/2018 1200   GLUCOSE 115 (H) 10/14/2011 0931   BUN 14 05/28/2018 1200   CREATININE 0.87 05/28/2018 1200   CALCIUM 9.6 05/28/2018 1200   GFRNONAA 66 05/28/2018 1200   GFRAA 76 05/28/2018 1200   No results found for: HGBA1C No results found for:  INSULIN CBC No results found for: WBC, RBC, HGB, HCT, PLT, MCV, MCH, MCHC, RDW, LYMPHSABS, MONOABS, EOSABS, BASOSABS Iron/TIBC/Ferritin/ %Sat No results found for: IRON, TIBC, FERRITIN, IRONPCTSAT Lipid Panel     Component Value Date/Time  CHOL 152 10/14/2011 0931   TRIG 108.0 10/14/2011 0931   HDL 43.90 10/14/2011 0931   CHOLHDL 3 10/14/2011 0931   VLDL 21.6 10/14/2011 0931   LDLCALC 87 10/14/2011 0931   Hepatic Function Panel     Component Value Date/Time   PROT 6.9 05/28/2018 1200   ALBUMIN 4.5 05/28/2018 1200   AST 28 05/28/2018 1200   ALT 28 05/28/2018 1200   ALKPHOS 83 05/28/2018 1200   BILITOT 0.6 05/28/2018 1200   BILIDIR 0.0 10/14/2011 0931   No results found for: TSH   Results for Galli, Shambria H "JUDY" (MRN [id number]) as of 07/02/2018 10:58  Ref. Range 05/28/2018 12:00  Vitamin D, 25-Hydroxy Latest Ref Range: 30.0 - 100.0 ng/mL 72.9   ASSESSMENT AND PLAN: Vitamin D deficiency  Type 2 diabetes mellitus without complication, with long-term current use of insulin (HCC)  Smoker - Plan: varenicline (CHANTIX STARTING MONTH PAK) 0.5 MG X 11 & 1 MG X 42 tablet  Class 1 obesity with serious comorbidity and body mass index (BMI) of 32.0 to 32.9 in adult, unspecified obesity type  PLAN:  Vitamin D Deficiency Quinne was informed that low vitamin D levels contributes to fatigue and are associated with obesity, breast, and colon cancer. She agrees to continue to take OTC Vit D @2 ,000 IU every day and will follow up for routine testing of vitamin D, at least 2-3 times per year. She was informed of the risk of over-replacement of vitamin D and agrees to not increase her dose unless she discusses this with [name] first. Marene agrees to follow up in 2 weeks.  Diabetes II Laneah has been given extensive diabetes education by myself today including ideal fasting and post-prandial blood glucose readings, individual ideal Hgb A1c goals, and hypoglycemia prevention. We discussed the  importance of good blood sugar control to decrease the likelihood of diabetic complications such as nephropathy, neuropathy, limb loss, blindness, coronary artery disease, and death. We discussed the importance of intensive lifestyle modification including diet, exercise and weight loss as the first line treatment for diabetes. Jozlynn agrees to continue her diabetes medications at current dose and will slowly decrease her insulin dose. Deira agrees to follow up at the agreed upon time.  Smoker Mayan agrees to take a starter dose pack of Chantix and will follow up in 2 weeks.  Obesity Damarys is currently in the action stage of change. As such, her goal is to continue with weight loss efforts. She has agreed to follow the Category 1 plan. She agrees to do more meal planning and less eating out. Zadia has been instructed to work up to a goal of 150 minutes of combined cardio and strengthening exercise per week for weight loss and overall health benefits. We discussed the following Behavioral Modification Strategies today: increasing lean protein intake, decreasing simple carbohydrates, increasing vegetables, increase H2O intake, decrease eating out, no skipping meals, and work on meal planning and easy cooking plans.  Navika has agreed to follow up with our clinic in 2 weeks. She was informed of the importance of frequent follow up visits to maximize her success with intensive lifestyle modifications for her multiple health conditions.   OBESITY BEHAVIORAL INTERVENTION VISIT  Today's visit was # 3   Starting weight: 180 lbs Starting date: 05/28/18 Today's weight : Weight: 173 lb (78.5 kg)  Today's date: 06/27/2018 Total lbs lost to date: 7 At least 15 minutes were spent on discussing the following behavioral intervention visit.  ASK: We discussed the  diagnosis of obesity with [name] today and Ofelia agreed to give [name] permission to discuss obesity behavioral modification therapy  today.  ASSESS: Kameryn has the diagnosis of obesity and her BMI today is 32.70. Hidaya is in the action stage of change.   ADVISE: Kalina was educated on the multiple health risks of obesity as well as the benefit of weight loss to improve her health. She was advised of the need for long term treatment and the importance of lifestyle modifications to improve her current health and to decrease her risk of future health problems.  AGREE: Multiple dietary modification options and treatment options were discussed and Brianny agreed to follow the recommendations documented in the above note.  ARRANGE: Clorinda was educated on the importance of frequent visits to treat obesity as outlined per CMS and USPSTF guidelines and agreed to schedule her next follow up appointment today.  I, Marcille Blanco, am acting as Location manager for [employer]. Owens Shark, DO  I have reviewed the above documentation for accuracy and completeness, and I agree with the above. -[name], DO

## 2018-07-11 DIAGNOSIS — I10 Essential (primary) hypertension: Secondary | ICD-10-CM | POA: Diagnosis not present

## 2018-07-11 DIAGNOSIS — E559 Vitamin D deficiency, unspecified: Secondary | ICD-10-CM | POA: Diagnosis not present

## 2018-07-11 DIAGNOSIS — M25569 Pain in unspecified knee: Secondary | ICD-10-CM | POA: Diagnosis not present

## 2018-07-11 DIAGNOSIS — I2510 Atherosclerotic heart disease of native coronary artery without angina pectoris: Secondary | ICD-10-CM | POA: Diagnosis not present

## 2018-07-11 DIAGNOSIS — E1159 Type 2 diabetes mellitus with other circulatory complications: Secondary | ICD-10-CM | POA: Diagnosis not present

## 2018-07-11 DIAGNOSIS — E1165 Type 2 diabetes mellitus with hyperglycemia: Secondary | ICD-10-CM | POA: Diagnosis not present

## 2018-07-11 DIAGNOSIS — E782 Mixed hyperlipidemia: Secondary | ICD-10-CM | POA: Diagnosis not present

## 2018-07-11 DIAGNOSIS — Q600 Renal agenesis, unilateral: Secondary | ICD-10-CM | POA: Diagnosis not present

## 2018-07-11 DIAGNOSIS — R32 Unspecified urinary incontinence: Secondary | ICD-10-CM | POA: Diagnosis not present

## 2018-07-11 DIAGNOSIS — R69 Illness, unspecified: Secondary | ICD-10-CM | POA: Diagnosis not present

## 2018-07-12 ENCOUNTER — Ambulatory Visit (INDEPENDENT_AMBULATORY_CARE_PROVIDER_SITE_OTHER): Payer: Medicare HMO | Admitting: Bariatrics

## 2018-07-12 ENCOUNTER — Encounter (INDEPENDENT_AMBULATORY_CARE_PROVIDER_SITE_OTHER): Payer: Self-pay | Admitting: Bariatrics

## 2018-07-12 VITALS — BP 105/65 | HR 81 | Temp 97.8°F | Ht 61.0 in | Wt 169.0 lb

## 2018-07-12 DIAGNOSIS — Z794 Long term (current) use of insulin: Secondary | ICD-10-CM

## 2018-07-12 DIAGNOSIS — Z6832 Body mass index (BMI) 32.0-32.9, adult: Secondary | ICD-10-CM | POA: Diagnosis not present

## 2018-07-12 DIAGNOSIS — E559 Vitamin D deficiency, unspecified: Secondary | ICD-10-CM | POA: Diagnosis not present

## 2018-07-12 DIAGNOSIS — E669 Obesity, unspecified: Secondary | ICD-10-CM

## 2018-07-12 DIAGNOSIS — E1129 Type 2 diabetes mellitus with other diabetic kidney complication: Secondary | ICD-10-CM

## 2018-07-16 DIAGNOSIS — E669 Obesity, unspecified: Secondary | ICD-10-CM | POA: Insufficient documentation

## 2018-07-16 DIAGNOSIS — I2510 Atherosclerotic heart disease of native coronary artery without angina pectoris: Secondary | ICD-10-CM | POA: Diagnosis not present

## 2018-07-16 DIAGNOSIS — Z794 Long term (current) use of insulin: Secondary | ICD-10-CM | POA: Diagnosis not present

## 2018-07-16 DIAGNOSIS — Z1389 Encounter for screening for other disorder: Secondary | ICD-10-CM | POA: Diagnosis not present

## 2018-07-16 DIAGNOSIS — R0989 Other specified symptoms and signs involving the circulatory and respiratory systems: Secondary | ICD-10-CM | POA: Diagnosis not present

## 2018-07-16 DIAGNOSIS — Z0000 Encounter for general adult medical examination without abnormal findings: Secondary | ICD-10-CM | POA: Diagnosis not present

## 2018-07-16 DIAGNOSIS — E1169 Type 2 diabetes mellitus with other specified complication: Secondary | ICD-10-CM | POA: Insufficient documentation

## 2018-07-16 DIAGNOSIS — E1159 Type 2 diabetes mellitus with other circulatory complications: Secondary | ICD-10-CM | POA: Diagnosis not present

## 2018-07-16 DIAGNOSIS — Z23 Encounter for immunization: Secondary | ICD-10-CM | POA: Diagnosis not present

## 2018-07-16 DIAGNOSIS — Z6831 Body mass index (BMI) 31.0-31.9, adult: Secondary | ICD-10-CM

## 2018-07-16 DIAGNOSIS — R69 Illness, unspecified: Secondary | ICD-10-CM | POA: Diagnosis not present

## 2018-07-16 DIAGNOSIS — E782 Mixed hyperlipidemia: Secondary | ICD-10-CM | POA: Diagnosis not present

## 2018-07-16 DIAGNOSIS — I10 Essential (primary) hypertension: Secondary | ICD-10-CM | POA: Diagnosis not present

## 2018-07-16 DIAGNOSIS — E119 Type 2 diabetes mellitus without complications: Secondary | ICD-10-CM | POA: Insufficient documentation

## 2018-07-16 NOTE — Progress Notes (Signed)
Office: [phone]  /  Fax: [phone]   HPI:   Chief Complaint: OBESITY Sandra [name] is here to discuss her progress with her obesity treatment plan. She is on the Category 1 plan and is following her eating plan approximately 80 % of the time. She states she is walking 7,000 steps 7 times per week. Sandra [name] states that she is doing well with her plan. She is limiting her "eating out".  Her weight is 169 lb (76.7 kg) today and has had a weight loss of 4 pounds over a period of 2 weeks since her last visit. She has lost 11 lbs since starting treatment with [name].  Diabetes II Sandra [name] has a diagnosis of diabetes type II. Sandra [name] states that her fasting BGs range between 90 and 130 and her 2 hour post prandial blood sugars are between 120 and 140's. She is taking Victoza 1.8mg , Jardiance 25mg , and Basaglar typically 13 units. She has been working on intensive lifestyle modifications including diet, exercise, and weight loss to help control her blood glucose levels.  Vitamin D deficiency Sandra [name] has a diagnosis of vitamin D deficiency. She is currently taking OTC vit D 2,000 units and denies nausea, vomiting, or muscle weakness.  ALLERGIES: Allergies  Allergen Reactions  . Erythromycin   . Epinephrine Palpitations  . Metformin And Related Rash    MEDICATIONS: Current Outpatient Medications on File Prior to Visit  Medication Sig Dispense Refill  . amitriptyline (ELAVIL) 25 MG tablet Take one to three (1-3) tablets (25 mg to 75 mg total) by mouth twice daily as needed for back pain.    Sandra [name] Kitchen aspirin EC 81 MG tablet Take 1 tablet (81 mg total) by mouth daily. 90 tablet 3  . BIOTIN PO Take by mouth.    Sandra [name] Kitchen CALCIUM PO Take by mouth daily. Reported on 02/24/2016    . cetirizine (ZYRTEC) 10 MG tablet Take 10 mg by mouth daily.    . Cinnamon 500 MG capsule Take 500 mg by mouth daily.    . diclofenac sodium (VOLTAREN) 1 % GEL Apply topically 4 (four) times daily.    . empagliflozin (JARDIANCE) 25 MG TABS  tablet Take 25 mg by mouth daily.    . fluticasone (FLONASE) 50 MCG/ACT nasal spray Place 2 sprays into the nose daily.    Sandra [name] Kitchen guaiFENesin (MUCINEX) 600 MG 12 hr tablet Take 600 mg by mouth daily.      Sandra [name] Kitchen ibuprofen (ADVIL,MOTRIN) 100 MG tablet Take 100 mg by mouth every 6 (six) hours as needed.      . Insulin Glargine (BASAGLAR KWIKPEN [place]) Inject 16 Units into the skin.    Sandra [name] Kitchen liraglutide (VICTOZA) 18 MG/3ML SOPN Inject 1.8 mg into the skin daily.    Sandra [name] Kitchen lisinopril (PRINIVIL,ZESTRIL) 2.5 MG tablet Take 2.5 mg by mouth daily.    . metoprolol (TOPROL-XL) 50 MG 24 hr tablet Take 25 mg by mouth daily.     . Multiple Vitamin (MULTIVITAMIN) tablet Take 1 tablet by mouth daily. Reported on 02/24/2016    . oxybutynin (DITROPAN-XL) 10 MG 24 hr tablet Take 10 mg by mouth at bedtime.    . rosuvastatin (CRESTOR) 10 MG tablet Take 10 mg by mouth daily.    Sandra [name] Kitchen tiZANidine (ZANAFLEX) 4 MG tablet Take 4 mg by mouth every 6 (six) hours as needed for muscle spasms (back pain).    . varenicline (CHANTIX STARTING MONTH PAK) 0.5 MG X 11 & 1 MG X 42 tablet Take one 0.5 mg tablet by mouth once  daily for 3 days, then increase to one 0.5 mg tablet twice daily for 4 days, then increase to one 1 mg tablet twice daily. 53 tablet 0  . VITAMIN D, CHOLECALCIFEROL, PO Take 2,000 mg by mouth daily.      No current facility-administered medications on file prior to visit.     PAST MEDICAL HISTORY: Past Medical History:  Diagnosis Date  . Arthritis   . Back pain   . Chronic kidney disease   . Congenital absence of left kidney   . Congenital absence of left ovary   . Coronary artery disease 2004   STATUS POST STENTING OF THE RIGHT CORONARY OSTIUM   . DDD (degenerative disc disease)   . Diabetes mellitus without complication ([place])   . Dislocated shoulder    left  . DVT (deep venous thrombosis) (HCC)    left posterior tibial vein  . High blood pressure   . Hypercholesterolemia   . Joint pain   . Osteoarthritis   . Peripheral  vascular disease ([place])   . Sleep apnea   . Sleep apnea   . SOB (shortness of breath)   . Tobacco dependence   . Vitamin D deficiency     PAST SURGICAL HISTORY: Past Surgical History:  Procedure Laterality Date  . ANGIOPLASTY  2004  . bone spur shoulder    . FOOT SURGERY     plantar fascitis  . KNEE SURGERY  1950  . TOTAL ABDOMINAL HYSTERECTOMY    . TUBAL LIGATION    . VEIN BYPASS SURGERY     2014    SOCIAL HISTORY: Social History   Tobacco Use  . Smoking status: Former Smoker    Packs/day: 1.00    Last attempt to quit: 10/27/2015    Years since quitting: 2.7  . Smokeless tobacco: Never Used  Substance Use Topics  . Alcohol use: No    Alcohol/week: 0.0 standard drinks  . Drug use: No    FAMILY HISTORY: Family History  Problem Relation Age of Onset  . Leukemia Sister   . Hypertension Brother   . Heart disease Father        before age 16  . High blood pressure Father   . Sudden death Father     ROS: Review of Systems  Constitutional: Positive for weight loss.  Gastrointestinal: Negative for nausea and vomiting.  Musculoskeletal:       Negative for muscle weakness.    PHYSICAL EXAM: Blood pressure 105/65, pulse 81, temperature 97.8 F (36.6 C), temperature source Oral, height 5\' 1"  (1.549 m), weight 169 lb (76.7 kg), SpO2 94 %. Body mass index is 31.93 kg/m. Physical Exam  Constitutional: She is oriented to person, place, and time. She appears well-developed and well-nourished.  Cardiovascular: Normal rate.  Pulmonary/Chest: Effort normal.  Musculoskeletal: Normal range of motion.  Neurological: She is oriented to person, place, and time.  Skin: Skin is warm and dry.  Psychiatric: She has a normal mood and affect. Her behavior is normal.  Vitals reviewed.   RECENT LABS AND TESTS: BMET    Component Value Date/Time   NA 143 05/28/2018 1200   K 4.4 05/28/2018 1200   CL 102 05/28/2018 1200   CO2 24 05/28/2018 1200   GLUCOSE 96 05/28/2018 1200    GLUCOSE 115 (H) 10/14/2011 0931   BUN 14 05/28/2018 1200   CREATININE 0.87 05/28/2018 1200   CALCIUM 9.6 05/28/2018 1200   GFRNONAA 66 05/28/2018 1200   GFRAA 76 05/28/2018 1200  No results found for: HGBA1C No results found for: INSULIN CBC No results found for: WBC, RBC, HGB, HCT, PLT, MCV, MCH, MCHC, RDW, LYMPHSABS, MONOABS, EOSABS, BASOSABS Iron/TIBC/Ferritin/ %Sat No results found for: IRON, TIBC, FERRITIN, IRONPCTSAT Lipid Panel     Component Value Date/Time   CHOL 152 10/14/2011 0931   TRIG 108.0 10/14/2011 0931   HDL 43.90 10/14/2011 0931   CHOLHDL 3 10/14/2011 0931   VLDL 21.6 10/14/2011 0931   LDLCALC 87 10/14/2011 0931   Hepatic Function Panel     Component Value Date/Time   PROT 6.9 05/28/2018 1200   ALBUMIN 4.5 05/28/2018 1200   AST 28 05/28/2018 1200   ALT 28 05/28/2018 1200   ALKPHOS 83 05/28/2018 1200   BILITOT 0.6 05/28/2018 1200   BILIDIR 0.0 10/14/2011 0931   No results found for: TSH  Results for Werst, Sandra H "JUDY" (MRN [id number]) as of 07/16/2018 16:01  Ref. Range 05/28/2018 12:00  Vitamin D, 25-Hydroxy Latest Ref Range: 30.0 - 100.0 ng/mL 72.9   ASSESSMENT AND PLAN: Type 2 diabetes mellitus with other diabetic kidney complication, with long-term current use of insulin (HCC)  Vitamin D deficiency  Class 1 obesity with serious comorbidity and body mass index (BMI) of 32.0 to 32.9 in adult, unspecified obesity type  PLAN:  Diabetes II Sandra [name] has been given extensive diabetes education by myself today including ideal fasting and post-prandial blood glucose readings, individual ideal Hgb A1c goals, and hypoglycemia prevention. We discussed the importance of good blood sugar control to decrease the likelihood of diabetic complications such as nephropathy, neuropathy, limb loss, blindness, coronary artery disease, and death. We discussed the importance of intensive lifestyle modification including diet, exercise and weight loss as the first line  treatment for diabetes. Sandra [name] agrees to continue her diabetes medications and will follow up at the agreed upon time in 2 weeks.  Vitamin D Deficiency Sandra [name] was informed that low vitamin D levels contributes to fatigue and are associated with obesity, breast, and colon cancer. She agrees to continue to take OTC Vit D @2 ,000 IU every day and will follow up for routine testing of vitamin D, at least 2-3 times per year. She was informed of the risk of over-replacement of vitamin D and agrees to not increase her dose unless she discusses this with [name] first. She will follow up as agreed.  Obesity Sandra [name] is currently in the action stage of change. As such, her goal is to continue with weight loss efforts. She has agreed to follow the Category 1 plan. She was given "[place] Road" and "Thanksgiving" information handouts. Sandra [name] has been instructed to work up to a goal of 150 minutes of combined cardio and strengthening exercise per week for weight loss and overall health benefits. We discussed the following Behavioral Modification Strategies today: increasing lean protein intake, decreasing simple carbohydrates, increasing vegetables, increase H2O intake, increase high fiber foods, and work on meal planning and easy cooking plans.  Sandra [name] has agreed to follow up with our clinic in 2 weeks. She was informed of the importance of frequent follow up visits to maximize her success with intensive lifestyle modifications for her multiple health conditions.   OBESITY BEHAVIORAL INTERVENTION VISIT  Today's visit was # 4   Starting weight: 180 lbs Starting date: 05/28/18 Today's weight : Weight: 169 lb (76.7 kg)  Today's date: 07/12/2018 Total lbs lost to date: 11 At least 15 minutes were spent on discussing the following behavioral intervention visit.  ASK: We discussed the  diagnosis of obesity with Sandra [name] today and Sandra [name] agreed to give [name] permission to discuss obesity behavioral modification  therapy today.  ASSESS: Sandra [name] has the diagnosis of obesity and her BMI today is 31.95. Sandra [name] is in the action stage of change.   ADVISE: Sandra [name] was educated on the multiple health risks of obesity as well as the benefit of weight loss to improve her health. She was advised of the need for long term treatment and the importance of lifestyle modifications to improve her current health and to decrease her risk of future health problems.  AGREE: Multiple dietary modification options and treatment options were discussed and Sandra [name] agreed to follow the recommendations documented in the above note.  ARRANGE: Sandra [name] was educated on the importance of frequent visits to treat obesity as outlined per CMS and USPSTF guidelines and agreed to schedule her next follow up appointment today.  I, Marcille Blanco, am acting as Location manager for [employer]. Owens Shark, DO  I have reviewed the above documentation for accuracy and completeness, and I agree with the above. -[name], DO

## 2018-07-17 ENCOUNTER — Other Ambulatory Visit (HOSPITAL_COMMUNITY): Payer: Self-pay | Admitting: Family Medicine

## 2018-07-17 DIAGNOSIS — R0989 Other specified symptoms and signs involving the circulatory and respiratory systems: Secondary | ICD-10-CM

## 2018-07-18 ENCOUNTER — Ambulatory Visit (HOSPITAL_COMMUNITY)
Admission: RE | Admit: 2018-07-18 | Discharge: 2018-07-18 | Disposition: A | Discharge status: Home / Self Care | Payer: Medicare HMO | Source: Ambulatory Visit | Attending: Family Medicine | Admitting: Family Medicine

## 2018-07-18 DIAGNOSIS — R0989 Other specified symptoms and signs involving the circulatory and respiratory systems: Secondary | ICD-10-CM | POA: Insufficient documentation

## 2018-07-26 ENCOUNTER — Ambulatory Visit (INDEPENDENT_AMBULATORY_CARE_PROVIDER_SITE_OTHER): Payer: Medicare HMO | Admitting: Bariatrics

## 2018-07-26 ENCOUNTER — Encounter (INDEPENDENT_AMBULATORY_CARE_PROVIDER_SITE_OTHER): Payer: Self-pay | Admitting: Bariatrics

## 2018-07-26 VITALS — BP 102/63 | HR 90 | Temp 97.7°F | Ht 61.0 in | Wt 166.0 lb

## 2018-07-26 DIAGNOSIS — R69 Illness, unspecified: Secondary | ICD-10-CM | POA: Diagnosis not present

## 2018-07-26 DIAGNOSIS — Z6831 Body mass index (BMI) 31.0-31.9, adult: Secondary | ICD-10-CM | POA: Diagnosis not present

## 2018-07-26 DIAGNOSIS — E119 Type 2 diabetes mellitus without complications: Secondary | ICD-10-CM | POA: Diagnosis not present

## 2018-07-26 DIAGNOSIS — E669 Obesity, unspecified: Secondary | ICD-10-CM | POA: Diagnosis not present

## 2018-07-26 DIAGNOSIS — Z794 Long term (current) use of insulin: Secondary | ICD-10-CM

## 2018-07-26 DIAGNOSIS — F17200 Nicotine dependence, unspecified, uncomplicated: Secondary | ICD-10-CM

## 2018-07-26 DIAGNOSIS — E559 Vitamin D deficiency, unspecified: Secondary | ICD-10-CM

## 2018-07-30 ENCOUNTER — Other Ambulatory Visit: Payer: Self-pay | Admitting: Family Medicine

## 2018-07-30 DIAGNOSIS — Z1231 Encounter for screening mammogram for malignant neoplasm of breast: Secondary | ICD-10-CM

## 2018-07-30 DIAGNOSIS — R69 Illness, unspecified: Secondary | ICD-10-CM | POA: Diagnosis not present

## 2018-07-31 ENCOUNTER — Encounter (INDEPENDENT_AMBULATORY_CARE_PROVIDER_SITE_OTHER): Payer: Self-pay | Admitting: Bariatrics

## 2018-07-31 NOTE — Progress Notes (Signed)
Office: [phone]  /  Fax: [phone]   HPI:   Chief Complaint: OBESITY Sandra [name] is here to discuss her progress with her obesity treatment plan. She is on the Category 1 plan and is following her eating plan approximately 80 % of the time. She states she is doing 9,000 steps 7 times per week. Sandra [name] is doing well with the Category 1 plan, but the plan is getting "boring" at times. Her weight is 166 lb (75.3 kg) today and has had a weight loss of 3 pounds over a period of 2 weeks since her last visit. She has lost 14 lbs since starting treatment with [name].  Diabetes II Sandra [name] has a diagnosis of diabetes type II. She is currently on Jardiance, Basaglar 10 units and Victoza 1.8 mg. Sandra [name] states fasting BGs range between 90 and 120's and 2 hour post prandial BGs range between 120 and 150's and is rarely elevated around 200. Sandra [name] denies any hypoglycemic episodes. She has been working on intensive lifestyle modifications including diet, exercise, and weight loss to help control her blood glucose levels.  Vitamin D deficiency Sandra [name] has a diagnosis of vitamin D deficiency. She is currently taking OTC vit D and denies nausea, vomiting or muscle weakness.  Smoking Cessation Sandra [name] is working on smoking cessation and is currently taking Chantix.  ALLERGIES: Allergies  Allergen Reactions  . Erythromycin   . Epinephrine Palpitations  . Metformin And Related Rash    MEDICATIONS: Current Outpatient Medications on File Prior to Visit  Medication Sig Dispense Refill  . amitriptyline (ELAVIL) 25 MG tablet Take one to three (1-3) tablets (25 mg to 75 mg total) by mouth twice daily as needed for back pain.    Marland Kitchen aspirin EC 81 MG tablet Take 1 tablet (81 mg total) by mouth daily. 90 tablet 3  . BIOTIN PO Take by mouth.    Marland Kitchen CALCIUM PO Take by mouth daily. Reported on 02/24/2016    . cetirizine (ZYRTEC) 10 MG tablet Take 10 mg by mouth daily.    . Cinnamon 500 MG capsule Take 500 mg by mouth  daily.    Marland Kitchen co-enzyme Q-10 30 MG capsule Take 30 mg by mouth 3 (three) times daily.    . diclofenac sodium (VOLTAREN) 1 % GEL Apply topically 4 (four) times daily.    . empagliflozin (JARDIANCE) 25 MG TABS tablet Take 25 mg by mouth daily.    . fluticasone (FLONASE) 50 MCG/ACT nasal spray Place 2 sprays into the nose daily.    Marland Kitchen guaiFENesin (MUCINEX) 600 MG 12 hr tablet Take 600 mg by mouth daily.      Marland Kitchen ibuprofen (ADVIL,MOTRIN) 100 MG tablet Take 100 mg by mouth every 6 (six) hours as needed.      . Insulin Glargine (BASAGLAR KWIKPEN Sandra [name]) Inject 10 Units into the skin.     Marland Kitchen liraglutide (VICTOZA) 18 MG/3ML SOPN Inject 1.8 mg into the skin daily.    Marland Kitchen lisinopril (PRINIVIL,ZESTRIL) 2.5 MG tablet Take 2.5 mg by mouth daily.    . metoprolol (TOPROL-XL) 50 MG 24 hr tablet Take 25 mg by mouth daily.     . Multiple Vitamin (MULTIVITAMIN) tablet Take 1 tablet by mouth daily. Reported on 02/24/2016    . oxybutynin (DITROPAN-XL) 10 MG 24 hr tablet Take 10 mg by mouth at bedtime.    . rosuvastatin (CRESTOR) 10 MG tablet Take 10 mg by mouth once a week.     Marland Kitchen tiZANidine (ZANAFLEX) 4 MG tablet Take 4  mg by mouth every 6 (six) hours as needed for muscle spasms (back pain).    . varenicline (CHANTIX STARTING MONTH PAK) 0.5 MG X 11 & 1 MG X 42 tablet Take one 0.5 mg tablet by mouth once daily for 3 days, then increase to one 0.5 mg tablet twice daily for 4 days, then increase to one 1 mg tablet twice daily. 53 tablet 0  . VITAMIN D, CHOLECALCIFEROL, PO Take 2,000 mg by mouth daily.      No current facility-administered medications on file prior to visit.     PAST MEDICAL HISTORY: Past Medical History:  Diagnosis Date  . Arthritis   . Back pain   . Chronic kidney disease   . Congenital absence of left kidney   . Congenital absence of left ovary   . Coronary artery disease 2004   STATUS POST STENTING OF THE RIGHT CORONARY OSTIUM   . DDD (degenerative disc disease)   . Diabetes mellitus without  complication ([place])   . Dislocated shoulder    left  . DVT (deep venous thrombosis) (HCC)    left posterior tibial vein  . High blood pressure   . Hypercholesterolemia   . Joint pain   . Osteoarthritis   . Peripheral vascular disease ([place])   . Sleep apnea   . Sleep apnea   . SOB (shortness of breath)   . Tobacco dependence   . Vitamin D deficiency     PAST SURGICAL HISTORY: Past Surgical History:  Procedure Laterality Date  . ANGIOPLASTY  2004  . bone spur shoulder    . FOOT SURGERY     plantar fascitis  . KNEE SURGERY  1950  . TOTAL ABDOMINAL HYSTERECTOMY    . TUBAL LIGATION    . VEIN BYPASS SURGERY     2014    SOCIAL HISTORY: Social History   Tobacco Use  . Smoking status: Former Smoker    Packs/day: 1.00    Last attempt to quit: 10/27/2015    Years since quitting: 2.7  . Smokeless tobacco: Never Used  Substance Use Topics  . Alcohol use: No    Alcohol/week: 0.0 standard drinks  . Drug use: No    FAMILY HISTORY: Family History  Problem Relation Age of Onset  . Leukemia Sister   . Hypertension Brother   . Heart disease Father        before age 69  . High blood pressure Father   . Sudden death Father     ROS: Review of Systems  Constitutional: Positive for weight loss.  Gastrointestinal: Negative for nausea and vomiting.  Musculoskeletal:       Negative for muscle weakness  Endo/Heme/Allergies:       Negative for hypoglycemia    PHYSICAL EXAM: Blood pressure 102/63, pulse 90, temperature 97.7 F (36.5 C), temperature source Oral, height 5\' 1"  (1.549 m), weight 166 lb (75.3 kg), SpO2 92 %. Body mass index is 31.37 kg/m. Physical Exam  Constitutional: She is oriented to person, place, and time. She appears well-developed and well-nourished.  Cardiovascular: Normal rate.  Pulmonary/Chest: Effort normal.  Musculoskeletal: Normal range of motion.  Neurological: She is oriented to person, place, and time.  Skin: Skin is warm and dry.    Psychiatric: She has a normal mood and affect. Her behavior is normal.  Vitals reviewed.   RECENT LABS AND TESTS: BMET    Component Value Date/Time   NA 143 05/28/2018 1200   K 4.4 05/28/2018 1200  CL 102 05/28/2018 1200   CO2 24 05/28/2018 1200   GLUCOSE 96 05/28/2018 1200   GLUCOSE 115 (H) 10/14/2011 0931   BUN 14 05/28/2018 1200   CREATININE 0.87 05/28/2018 1200   CALCIUM 9.6 05/28/2018 1200   GFRNONAA 66 05/28/2018 1200   GFRAA 76 05/28/2018 1200   No results found for: HGBA1C No results found for: INSULIN CBC No results found for: WBC, RBC, HGB, HCT, PLT, MCV, MCH, MCHC, RDW, LYMPHSABS, MONOABS, EOSABS, BASOSABS Iron/TIBC/Ferritin/ %Sat No results found for: IRON, TIBC, FERRITIN, IRONPCTSAT Lipid Panel     Component Value Date/Time   CHOL 152 10/14/2011 0931   TRIG 108.0 10/14/2011 0931   HDL 43.90 10/14/2011 0931   CHOLHDL 3 10/14/2011 0931   VLDL 21.6 10/14/2011 0931   LDLCALC 87 10/14/2011 0931   Hepatic Function Panel     Component Value Date/Time   PROT 6.9 05/28/2018 1200   ALBUMIN 4.5 05/28/2018 1200   AST 28 05/28/2018 1200   ALT 28 05/28/2018 1200   ALKPHOS 83 05/28/2018 1200   BILITOT 0.6 05/28/2018 1200   BILIDIR 0.0 10/14/2011 0931   No results found for: TSH  Results for Sandra [name], Sandra H "JUDY" (MRN [id number]) as of 07/31/2018 13:16  Ref. Range 05/28/2018 12:00  Vitamin D, 25-Hydroxy Latest Ref Range: 30.0 - 100.0 ng/mL 72.9   ASSESSMENT AND PLAN: Type 2 diabetes mellitus without complication, with long-term current use of insulin (HCC)  Vitamin D deficiency  Smoker  Class 1 obesity with serious comorbidity and body mass index (BMI) of 31.0 to 31.9 in adult, unspecified obesity type  PLAN:  Diabetes II Sandra [name] has been given extensive diabetes education by myself today including ideal fasting and post-prandial blood glucose readings, individual ideal Hgb A1c goals and hypoglycemia prevention. We discussed the importance of good blood  sugar control to decrease the likelihood of diabetic complications such as nephropathy, neuropathy, limb loss, blindness, coronary artery disease, and death. We discussed the importance of intensive lifestyle modification including diet, exercise and weight loss as the first line treatment for diabetes. Sandra [name] agrees to go down to 7 units of [employer] and continue her other diabetes medications as prescribed. Sandra [name] will continue to monitor her blood sugars and follow up at the agreed upon time.  Vitamin D Deficiency Sandra [name] was informed that low vitamin D levels contributes to fatigue and are associated with obesity, breast, and colon cancer. She agrees to continue to take OTC vitamin D and will follow up for routine testing of vitamin D, at least 2-3 times per year. She was informed of the risk of over-replacement of vitamin D and agrees to not increase her dose unless she discusses this with [name] first. Sandra [name] will work in increasing vitamin D rich foods in her diet and will follow up as directed.  Smoking Cessation Sandra [name] will continue the Chantix and will follow up with our clinic in 2 weeks.  Obesity Sandra [name] is currently in the action stage of change. As such, her goal is to continue with weight loss efforts She has agreed to keep a food journal with 300 to 400 calories and 35+ grams of protein at supper daily and follow the Category 1 plan [place] has been instructed to work up to a goal of 150 minutes of combined cardio and strengthening exercise per week for weight loss and overall health benefits. We discussed the following Behavioral Modification Strategies today: increase H2O intake, increasing lean protein intake, decreasing simple carbohydrates , increasing vegetables, work on  meal planning and easy cooking plans, holiday eating strategies and celebration eating strategies "Thanksgiving" handouts were given to patient today.  Sandra [name] has agreed to follow up with our clinic in 2 weeks.  She was informed of the importance of frequent follow up visits to maximize her success with intensive lifestyle modifications for her multiple health conditions.   OBESITY BEHAVIORAL INTERVENTION VISIT  Today's visit was # 5   Starting weight: 180 lbs Starting date: 05/28/2018 Today's weight : 166 lbs. Today's date: 07/26/2018 Total lbs lost to date: 14 At least 15 minutes were spent on discussing the following behavioral intervention visit.   ASK: We discussed the diagnosis of obesity with Sandra [name] today and Sandra [name] agreed to give [name] permission to discuss obesity behavioral modification therapy today.  ASSESS: Sandra [name] has the diagnosis of obesity and her BMI today is 31.38 Sandra [name] is in the action stage of change   ADVISE: Sandra [name] was educated on the multiple health risks of obesity as well as the benefit of weight loss to improve her health. She was advised of the need for long term treatment and the importance of lifestyle modifications to improve her current health and to decrease her risk of future health problems.  AGREE: Multiple dietary modification options and treatment options were discussed and  Sandra [name] agreed to follow the recommendations documented in the above note.  ARRANGE: Sandra [name] was educated on the importance of frequent visits to treat obesity as outlined per CMS and USPSTF guidelines and agreed to schedule her next follow up appointment today.  Corey Skains, am acting as Location manager for [employer]. Owens Shark, DO  I have reviewed the above documentation for accuracy and completeness, and I agree with the above. -[name], DO

## 2018-08-07 DIAGNOSIS — L821 Other seborrheic keratosis: Secondary | ICD-10-CM | POA: Diagnosis not present

## 2018-08-07 DIAGNOSIS — L820 Inflamed seborrheic keratosis: Secondary | ICD-10-CM | POA: Diagnosis not present

## 2018-08-13 DIAGNOSIS — L080 Pyoderma: Secondary | ICD-10-CM | POA: Diagnosis not present

## 2018-08-13 DIAGNOSIS — Z7689 Persons encountering health services in other specified circumstances: Secondary | ICD-10-CM | POA: Diagnosis not present

## 2018-08-14 ENCOUNTER — Encounter (INDEPENDENT_AMBULATORY_CARE_PROVIDER_SITE_OTHER): Payer: Self-pay | Admitting: Bariatrics

## 2018-08-14 ENCOUNTER — Ambulatory Visit (INDEPENDENT_AMBULATORY_CARE_PROVIDER_SITE_OTHER): Payer: Medicare HMO | Admitting: Bariatrics

## 2018-08-14 VITALS — BP 125/71 | HR 85 | Temp 97.7°F | Ht 61.0 in | Wt 164.0 lb

## 2018-08-14 DIAGNOSIS — E119 Type 2 diabetes mellitus without complications: Secondary | ICD-10-CM

## 2018-08-14 DIAGNOSIS — R69 Illness, unspecified: Secondary | ICD-10-CM | POA: Diagnosis not present

## 2018-08-14 DIAGNOSIS — Z794 Long term (current) use of insulin: Secondary | ICD-10-CM | POA: Diagnosis not present

## 2018-08-14 DIAGNOSIS — F17200 Nicotine dependence, unspecified, uncomplicated: Secondary | ICD-10-CM

## 2018-08-14 DIAGNOSIS — Z6831 Body mass index (BMI) 31.0-31.9, adult: Secondary | ICD-10-CM | POA: Diagnosis not present

## 2018-08-14 DIAGNOSIS — E669 Obesity, unspecified: Secondary | ICD-10-CM

## 2018-08-14 DIAGNOSIS — I10 Essential (primary) hypertension: Secondary | ICD-10-CM | POA: Diagnosis not present

## 2018-08-14 NOTE — Progress Notes (Signed)
Office: [phone]  /  Fax: [phone]   HPI:   Chief Complaint: OBESITY Sandra [name] is here to discuss her progress with her obesity treatment plan. She is on the keep a food journal with 300 to 400 calories and 35+ grams of protein at supper daily and the Category 1 plan and is following her eating plan approximately 70 to 80 % of the time. She states she is doing 9,000 steps per day 7 times per week. Sandra [name] is doing well. She has not gotten the "MyFitnessPal" app. Her weight is 164 lb (74.4 kg) today and has had a weight loss of 2 pounds over a period of 2 weeks since her last visit. She has lost 16 lbs since starting treatment with [name].  Diabetes II Sandra [name] has a diagnosis of diabetes type II. She is on Basaglar 10 units, Victoza 1.8 mg and Jardiance 25 mg. Sandra [name] states fasting BGs range between 120 and 14 and 2 hour post prandial BGs range between 120 and 170's. Sandra [name] denies any hypoglycemic episodes. She has been working on intensive lifestyle modifications including diet, exercise, and weight loss to help control her blood glucose levels.  Hypertension Sandra [name] is a 72 y.o. female with hypertension. She is taking lisinopril and metoprolol. Sandra [name] denies lightheadedness. She is working weight loss to help control her blood pressure with the goal of decreasing her risk of heart attack and stroke. Sandra [name] blood pressure is well controlled.  Smoking Sandra [name] Sandra [name] is working on smoking Sandra [name] and she is currently on Chantix.  Sandra [name]: Sandra [name]  Allergen Reactions  . Erythromycin   . Epinephrine Palpitations  . Metformin And Related Rash    MEDICATIONS: Current Outpatient Medications on File Prior to Visit  Medication Sig Dispense Refill  . amitriptyline (ELAVIL) 25 MG tablet Take one to three (1-3) tablets (25 mg to 75 mg total) by mouth twice daily as needed for back pain.    Marland Kitchen aspirin EC 81 MG tablet Take 1 tablet (81 mg total) by mouth daily. 90 tablet 3    . BIOTIN PO Take by mouth.    Marland Kitchen CALCIUM PO Take by mouth daily. Reported on 02/24/2016    . cetirizine (ZYRTEC) 10 MG tablet Take 10 mg by mouth daily.    . Cinnamon 500 MG capsule Take 500 mg by mouth daily.    Marland Kitchen co-enzyme Q-10 30 MG capsule Take 30 mg by mouth 3 (three) times daily.    . diclofenac sodium (VOLTAREN) 1 % GEL Apply topically 4 (four) times daily.    . empagliflozin (JARDIANCE) 25 MG TABS tablet Take 25 mg by mouth daily.    . fluticasone (FLONASE) 50 MCG/ACT nasal spray Place 2 sprays into the nose daily.    Marland Kitchen guaiFENesin (MUCINEX) 600 MG 12 hr tablet Take 600 mg by mouth daily.      Marland Kitchen ibuprofen (ADVIL,MOTRIN) 100 MG tablet Take 100 mg by mouth every 6 (six) hours as needed.      . Insulin Glargine (BASAGLAR KWIKPEN [place]) Inject 10 Units into the skin.     Marland Kitchen liraglutide (VICTOZA) 18 MG/3ML SOPN Inject 1.8 mg into the skin daily.    Marland Kitchen lisinopril (PRINIVIL,ZESTRIL) 2.5 MG tablet Take 2.5 mg by mouth daily.    . metoprolol (TOPROL-XL) 50 MG 24 hr tablet Take 25 mg by mouth daily.     . Multiple Vitamin (MULTIVITAMIN) tablet Take 1 tablet by mouth daily. Reported on 02/24/2016    . oxybutynin (DITROPAN-XL) 10  MG 24 hr tablet Take 10 mg by mouth at bedtime.    . rosuvastatin (CRESTOR) 10 MG tablet Take 10 mg by mouth once a week.     Marland Kitchen tiZANidine (ZANAFLEX) 4 MG tablet Take 4 mg by mouth every 6 (six) hours as needed for muscle spasms (back pain).    . varenicline (CHANTIX STARTING MONTH PAK) 0.5 MG X 11 & 1 MG X 42 tablet Take one 0.5 mg tablet by mouth once daily for 3 days, then increase to one 0.5 mg tablet twice daily for 4 days, then increase to one 1 mg tablet twice daily. 53 tablet 0  . VITAMIN D, CHOLECALCIFEROL, PO Take 2,000 mg by mouth daily.      No current facility-administered medications on file prior to visit.     PAST MEDICAL HISTORY: Past Medical History:  Diagnosis Date  . Arthritis   . Back pain   . Chronic kidney disease   . Congenital absence of left  kidney   . Congenital absence of left ovary   . Coronary artery disease 2004   STATUS POST STENTING OF THE RIGHT CORONARY OSTIUM   . DDD (degenerative disc disease)   . Diabetes mellitus without complication ([place])   . Dislocated shoulder    left  . DVT (deep venous thrombosis) (HCC)    left posterior tibial vein  . High blood pressure   . Hypercholesterolemia   . Joint pain   . Osteoarthritis   . Peripheral vascular disease ([place])   . Sleep apnea   . Sleep apnea   . SOB (shortness of breath)   . Tobacco dependence   . Vitamin D deficiency     PAST SURGICAL HISTORY: Past Surgical History:  Procedure Laterality Date  . ANGIOPLASTY  2004  . bone spur shoulder    . FOOT SURGERY     plantar fascitis  . KNEE SURGERY  1950  . TOTAL ABDOMINAL HYSTERECTOMY    . TUBAL LIGATION    . VEIN BYPASS SURGERY     2014    SOCIAL HISTORY: Social History   Tobacco Use  . Smoking status: Former Smoker    Packs/day: 1.00    Last attempt to quit: 10/27/2015    Years since quitting: 2.8  . Smokeless tobacco: Never Used  Substance Use Topics  . Alcohol use: No    Alcohol/week: 0.0 standard drinks  . Drug use: No    FAMILY HISTORY: Family History  Problem Relation Age of Onset  . Leukemia Sister   . Hypertension Brother   . Heart disease Father        before age 5  . High blood pressure Father   . Sudden death Father     ROS: Review of Systems  Constitutional: Positive for weight loss.  Neurological:       Negative for lightheadedness  Endo/Heme/Sandra [name]:       Negative for hypoglycemia    PHYSICAL EXAM: Blood pressure 125/71, pulse 85, temperature 97.7 F (36.5 C), temperature source Oral, height 5\' 1"  (1.549 m), weight 164 lb (74.4 kg), SpO2 95 %. Body mass index is 30.99 kg/m. Physical Exam  Constitutional: She is oriented to person, place, and time. She appears well-developed and well-nourished.  Cardiovascular: Normal rate.  Pulmonary/Chest: Effort normal.    Musculoskeletal: Normal range of motion.  Neurological: She is oriented to person, place, and time.  Skin: Skin is warm and dry.  Psychiatric: She has a normal mood and affect. Her behavior  is normal.  Vitals reviewed.   RECENT LABS AND TESTS: BMET    Component Value Date/Time   NA 143 05/28/2018 1200   K 4.4 05/28/2018 1200   CL 102 05/28/2018 1200   CO2 24 05/28/2018 1200   GLUCOSE 96 05/28/2018 1200   GLUCOSE 115 (H) 10/14/2011 0931   BUN 14 05/28/2018 1200   CREATININE 0.87 05/28/2018 1200   CALCIUM 9.6 05/28/2018 1200   GFRNONAA 66 05/28/2018 1200   GFRAA 76 05/28/2018 1200   No results found for: HGBA1C No results found for: INSULIN CBC No results found for: WBC, RBC, HGB, HCT, PLT, MCV, MCH, MCHC, RDW, LYMPHSABS, MONOABS, EOSABS, BASOSABS Iron/TIBC/Ferritin/ %Sat No results found for: IRON, TIBC, FERRITIN, IRONPCTSAT Lipid Panel     Component Value Date/Time   CHOL 152 10/14/2011 0931   TRIG 108.0 10/14/2011 0931   HDL 43.90 10/14/2011 0931   CHOLHDL 3 10/14/2011 0931   VLDL 21.6 10/14/2011 0931   LDLCALC 87 10/14/2011 0931   Hepatic Function Panel     Component Value Date/Time   PROT 6.9 05/28/2018 1200   ALBUMIN 4.5 05/28/2018 1200   AST 28 05/28/2018 1200   ALT 28 05/28/2018 1200   ALKPHOS 83 05/28/2018 1200   BILITOT 0.6 05/28/2018 1200   BILIDIR 0.0 10/14/2011 0931   No results found for: TSH   Ref. Range 05/28/2018 12:00  Vitamin D, 25-Hydroxy Latest Ref Range: 30.0 - 100.0 ng/mL 72.9   ASSESSMENT AND PLAN: Type 2 diabetes mellitus without complication, with long-term current use of insulin (HCC)  Essential hypertension  Smoker  Class 1 obesity with serious comorbidity and body mass index (BMI) of 31.0 to 31.9 in adult, unspecified obesity type  PLAN:  Diabetes II Alainna has been given extensive diabetes education by myself today including ideal fasting and post-prandial blood glucose readings, individual ideal Hgb A1c goals and  hypoglycemia prevention. We discussed the importance of good blood sugar control to decrease the likelihood of diabetic complications such as nephropathy, neuropathy, limb loss, blindness, coronary artery disease, and death. We discussed the importance of intensive lifestyle modification including diet, exercise and weight loss as the first line treatment for diabetes. Cadee agrees to continue her diabetes medications and continue to check fasting blood sugar and 2 hour post prandial blood sugar. Zaliah will follow up at the agreed upon time.  Hypertension We discussed sodium restriction, working on healthy weight loss, and a regular exercise program as the means to achieve improved blood pressure control. Lashala agreed with this plan and agreed to follow up as directed. We will continue to monitor her blood pressure as well as her progress with the above lifestyle modifications. She will continue her medications as prescribed and will watch for signs of hypotension as she continues her lifestyle modifications.  Smoking Sandra [name] Ajna will continue Chantix and will follow up with our clinic in 2 weeks.  Obesity Kendell is currently in the action stage of change. As such, her goal is to continue with weight loss efforts She has agreed to follow the Category 1 plan and she will begin journaling occasionally before the next visit Jimeka will continue to do 9,000 steps per day 7 times per week for weight loss and overall health benefits. We discussed the following Behavioral Modification Strategies today: increase H2O intake, no skipping meals, planning for success, increasing lean protein intake, decreasing simple carbohydrates, increasing vegetables, decrease eating out, work on meal planning and easy cooking plans and holiday eating strategies  Kayleena has agreed to follow up with our clinic in 2 weeks. She was informed of the importance of frequent follow up visits to maximize her success with  intensive lifestyle modifications for her multiple health conditions.   OBESITY BEHAVIORAL INTERVENTION VISIT  Today's visit was # 6   Starting weight: 180 lbs Starting date: 05/28/2018 Today's weight : 164 lbs Today's date: 08/14/2018 Total lbs lost to date: 16 At least 15 minutes were spent on discussing the following behavioral intervention visit.   ASK: We discussed the diagnosis of obesity with [name] today and Claramae agreed to give [name] permission to discuss obesity behavioral modification therapy today.  ASSESS: Elta has the diagnosis of obesity and her BMI today is 51 Dotsie is in the action stage of change   ADVISE: Eran was educated on the multiple health risks of obesity as well as the benefit of weight loss to improve her health. She was advised of the need for long term treatment and the importance of lifestyle modifications to improve her current health and to decrease her risk of future health problems.  AGREE: Multiple dietary modification options and treatment options were discussed and  Britiny agreed to follow the recommendations documented in the above note.  ARRANGE: Emileigh was educated on the importance of frequent visits to treat obesity as outlined per CMS and USPSTF guidelines and agreed to schedule her next follow up appointment today.  Corey Skains, am acting as Location manager for [employer]. Owens Shark, DO  I have reviewed the above documentation for accuracy and completeness, and I agree with the above. -[name], DO

## 2018-08-17 DIAGNOSIS — E1165 Type 2 diabetes mellitus with hyperglycemia: Secondary | ICD-10-CM | POA: Diagnosis not present

## 2018-08-17 DIAGNOSIS — E559 Vitamin D deficiency, unspecified: Secondary | ICD-10-CM | POA: Diagnosis not present

## 2018-08-17 DIAGNOSIS — Z6831 Body mass index (BMI) 31.0-31.9, adult: Secondary | ICD-10-CM | POA: Diagnosis not present

## 2018-08-17 DIAGNOSIS — Z720 Tobacco use: Secondary | ICD-10-CM | POA: Diagnosis not present

## 2018-08-17 DIAGNOSIS — I2510 Atherosclerotic heart disease of native coronary artery without angina pectoris: Secondary | ICD-10-CM | POA: Diagnosis not present

## 2018-08-17 DIAGNOSIS — E1159 Type 2 diabetes mellitus with other circulatory complications: Secondary | ICD-10-CM | POA: Diagnosis not present

## 2018-08-17 DIAGNOSIS — E669 Obesity, unspecified: Secondary | ICD-10-CM | POA: Diagnosis not present

## 2018-08-17 DIAGNOSIS — Z794 Long term (current) use of insulin: Secondary | ICD-10-CM | POA: Diagnosis not present

## 2018-08-17 DIAGNOSIS — E782 Mixed hyperlipidemia: Secondary | ICD-10-CM | POA: Diagnosis not present

## 2018-08-20 DIAGNOSIS — H2513 Age-related nuclear cataract, bilateral: Secondary | ICD-10-CM | POA: Diagnosis not present

## 2018-08-20 DIAGNOSIS — H524 Presbyopia: Secondary | ICD-10-CM | POA: Diagnosis not present

## 2018-08-20 DIAGNOSIS — E119 Type 2 diabetes mellitus without complications: Secondary | ICD-10-CM | POA: Diagnosis not present

## 2018-08-20 DIAGNOSIS — H25013 Cortical age-related cataract, bilateral: Secondary | ICD-10-CM | POA: Diagnosis not present

## 2018-08-31 DIAGNOSIS — G4733 Obstructive sleep apnea (adult) (pediatric): Secondary | ICD-10-CM | POA: Diagnosis not present

## 2018-09-03 ENCOUNTER — Ambulatory Visit (INDEPENDENT_AMBULATORY_CARE_PROVIDER_SITE_OTHER): Payer: Medicare HMO | Admitting: Bariatrics

## 2018-09-03 ENCOUNTER — Encounter (INDEPENDENT_AMBULATORY_CARE_PROVIDER_SITE_OTHER): Payer: Self-pay | Admitting: Bariatrics

## 2018-09-03 VITALS — BP 117/68 | HR 86 | Temp 97.6°F | Ht 61.0 in | Wt 164.0 lb

## 2018-09-03 DIAGNOSIS — Z6831 Body mass index (BMI) 31.0-31.9, adult: Secondary | ICD-10-CM

## 2018-09-03 DIAGNOSIS — I10 Essential (primary) hypertension: Secondary | ICD-10-CM | POA: Diagnosis not present

## 2018-09-03 DIAGNOSIS — E119 Type 2 diabetes mellitus without complications: Secondary | ICD-10-CM

## 2018-09-03 DIAGNOSIS — Z794 Long term (current) use of insulin: Secondary | ICD-10-CM

## 2018-09-03 DIAGNOSIS — E669 Obesity, unspecified: Secondary | ICD-10-CM | POA: Diagnosis not present

## 2018-09-03 NOTE — Progress Notes (Signed)
Office: [phone]  /  Fax: [phone]   HPI:   Chief Complaint: OBESITY Sandra [name] is here to discuss her progress with her obesity treatment plan. She is on the Category 1 plan with occasional journaling and is following her eating plan approximately 50 % of the time. She states she is walking 6,000 steps per day 7 times per week. Sandra [name] stopped smoking three weeks ago and the holidays. Sandra [name] has taken out all of the sweets. Her weight is 164 lb (74.4 kg) today and she has maintained weight over a period of 3 weeks since her last visit. She has lost 16 lbs since starting treatment with [name].  Diabetes II Sandra [name] has a diagnosis of diabetes type II. Sandra [name] states fasting BGs range between 130 and 140's and 2 hour post prandial BGs range in the 200's. She is currently taking Jardiance and Victoza. Basaglar was increased to 13 units 08/17/18, "while quitting smoking". She denies any hypoglycemic episodes. Last A1c was at 6.7 She has been working on intensive lifestyle modifications including diet, exercise, and weight loss to help control her blood glucose levels.  Hypertension Sandra [name] is a 72 y.o. female with hypertension. Sandra [name] denies chest pain or shortness of breath on exertion. She is working weight loss to help control her blood pressure with the goal of decreasing her risk of heart attack and stroke. Sandra [name] blood pressure is well controlled.  ASSESSMENT AND PLAN:  Type 2 diabetes mellitus without complication, with long-term current use of insulin (HCC)  Essential hypertension  Class 1 obesity with serious comorbidity and body mass index (BMI) of 31.0 to 31.9 in adult, unspecified obesity type  PLAN:  Diabetes II Sandra [name] has been given extensive diabetes education by myself today including ideal fasting and post-prandial blood glucose readings, individual ideal Hgb A1c goals and hypoglycemia prevention. We discussed the importance of good blood sugar control to  decrease the likelihood of diabetic complications such as nephropathy, neuropathy, limb loss, blindness, coronary artery disease, and death. We discussed the importance of intensive lifestyle modification including diet, exercise and weight loss as the first line treatment for diabetes. Sandra [name] agrees to continue her diabetes medications and will follow up at the agreed upon time.  Hypertension We discussed sodium restriction, working on healthy weight loss, and a regular exercise program as the means to achieve improved blood pressure control. Sandra [name] agreed with this plan and agreed to follow up as directed. We will continue to monitor her blood pressure as well as her progress with the above lifestyle modifications. She will continue her medications as prescribed and will watch for signs of hypotension as she continues her lifestyle modifications.  Obesity Sandra [name] is currently in the action stage of change. As such, her goal is to continue with weight loss efforts She has agreed to follow the Category 1 plan with journaling occasionally Sandra [name] has been instructed to work up to a goal of 150 minutes of combined cardio and strengthening exercise per week for weight loss and overall health benefits. We discussed the following Behavioral Modification Strategies today: increase H2O intake, increasing lean protein intake, decreasing simple carbohydrates and increasing vegetables Handout for additional Category 1 and 2 options was provided to patient today.  Sandra [name] has agreed to follow up with our clinic in 2 weeks. She was informed of the importance of frequent follow up visits to maximize her success with intensive lifestyle modifications for her multiple health conditions.  ALLERGIES: Allergies  Allergen Reactions  . Erythromycin   .  Epinephrine Palpitations  . Metformin And Related Rash    MEDICATIONS: Current Outpatient Medications on File Prior to Visit  Medication Sig Dispense Refill  .  amitriptyline (ELAVIL) 25 MG tablet Take one to three (1-3) tablets (25 mg to 75 mg total) by mouth twice daily as needed for back pain.    Marland Kitchen aspirin EC 81 MG tablet Take 1 tablet (81 mg total) by mouth daily. 90 tablet 3  . BIOTIN PO Take by mouth.    Marland Kitchen CALCIUM PO Take by mouth daily. Reported on 02/24/2016    . cetirizine (ZYRTEC) 10 MG tablet Take 10 mg by mouth daily.    . Cinnamon 500 MG capsule Take 500 mg by mouth daily.    Marland Kitchen co-enzyme Q-10 30 MG capsule Take 30 mg by mouth 3 (three) times daily.    . diclofenac sodium (VOLTAREN) 1 % GEL Apply topically 4 (four) times daily.    . empagliflozin (JARDIANCE) 25 MG TABS tablet Take 25 mg by mouth daily.    . fluticasone (FLONASE) 50 MCG/ACT nasal spray Place 2 sprays into the nose daily.    Marland Kitchen guaiFENesin (MUCINEX) 600 MG 12 hr tablet Take 600 mg by mouth daily.      Marland Kitchen ibuprofen (ADVIL,MOTRIN) 100 MG tablet Take 100 mg by mouth every 6 (six) hours as needed.      . Insulin Glargine (BASAGLAR KWIKPEN [place]) Inject 10 Units into the skin.     Marland Kitchen liraglutide (VICTOZA) 18 MG/3ML SOPN Inject 1.8 mg into the skin daily.    Marland Kitchen lisinopril (PRINIVIL,ZESTRIL) 2.5 MG tablet Take 2.5 mg by mouth daily.    . metoprolol (TOPROL-XL) 50 MG 24 hr tablet Take 25 mg by mouth daily.     . Multiple Vitamin (MULTIVITAMIN) tablet Take 1 tablet by mouth daily. Reported on 02/24/2016    . oxybutynin (DITROPAN-XL) 10 MG 24 hr tablet Take 10 mg by mouth at bedtime.    . rosuvastatin (CRESTOR) 10 MG tablet Take 10 mg by mouth once a week.     Marland Kitchen tiZANidine (ZANAFLEX) 4 MG tablet Take 4 mg by mouth every 6 (six) hours as needed for muscle spasms (back pain).    . varenicline (CHANTIX STARTING MONTH PAK) 0.5 MG X 11 & 1 MG X 42 tablet Take one 0.5 mg tablet by mouth once daily for 3 days, then increase to one 0.5 mg tablet twice daily for 4 days, then increase to one 1 mg tablet twice daily. 53 tablet 0  . VITAMIN D, CHOLECALCIFEROL, PO Take 2,000 mg by mouth daily.      No  current facility-administered medications on file prior to visit.     PAST MEDICAL HISTORY: Past Medical History:  Diagnosis Date  . Arthritis   . Back pain   . Chronic kidney disease   . Congenital absence of left kidney   . Congenital absence of left ovary   . Coronary artery disease 2004   STATUS POST STENTING OF THE RIGHT CORONARY OSTIUM   . DDD (degenerative disc disease)   . Diabetes mellitus without complication ([place])   . Dislocated shoulder    left  . DVT (deep venous thrombosis) (HCC)    left posterior tibial vein  . High blood pressure   . Hypercholesterolemia   . Joint pain   . Osteoarthritis   . Peripheral vascular disease ([place])   . Sleep apnea   . Sleep apnea   . SOB (shortness of breath)   .  Tobacco dependence   . Vitamin D deficiency     PAST SURGICAL HISTORY: Past Surgical History:  Procedure Laterality Date  . ANGIOPLASTY  2004  . bone spur shoulder    . FOOT SURGERY     plantar fascitis  . KNEE SURGERY  1950  . TOTAL ABDOMINAL HYSTERECTOMY    . TUBAL LIGATION    . VEIN BYPASS SURGERY     2014    SOCIAL HISTORY: Social History   Tobacco Use  . Smoking status: Former Smoker    Packs/day: 1.00    Last attempt to quit: 10/27/2015    Years since quitting: 2.8  . Smokeless tobacco: Never Used  Substance Use Topics  . Alcohol use: No    Alcohol/week: 0.0 standard drinks  . Drug use: No    FAMILY HISTORY: Family History  Problem Relation Age of Onset  . Leukemia Sister   . Hypertension Brother   . Heart disease Father        before age 25  . High blood pressure Father   . Sudden death Father     ROS: Review of Systems  Constitutional: Negative for weight loss.  Respiratory: Negative for shortness of breath (on exertion).   Cardiovascular: Negative for chest pain.  Endo/Heme/Allergies:       Negative for hypoglycemia    PHYSICAL EXAM: Blood pressure 117/68, pulse 86, temperature 97.6 F (36.4 C), temperature source Oral, height  5\' 1"  (1.549 m), weight 164 lb (74.4 kg), SpO2 94 %. Body mass index is 30.99 kg/m. Physical Exam Vitals signs reviewed.  Constitutional:      Appearance: Normal appearance. She is well-developed. She is obese.  Cardiovascular:     Rate and Rhythm: Normal rate.  Pulmonary:     Effort: Pulmonary effort is normal.  Musculoskeletal: Normal range of motion.  Skin:    General: Skin is warm and dry.  Neurological:     Mental Status: She is alert and oriented to person, place, and time.  Psychiatric:        Mood and Affect: Mood normal.        Behavior: Behavior normal.     RECENT LABS AND TESTS: BMET    Component Value Date/Time   NA 143 05/28/2018 1200   K 4.4 05/28/2018 1200   CL 102 05/28/2018 1200   CO2 24 05/28/2018 1200   GLUCOSE 96 05/28/2018 1200   GLUCOSE 115 (H) 10/14/2011 0931   BUN 14 05/28/2018 1200   CREATININE 0.87 05/28/2018 1200   CALCIUM 9.6 05/28/2018 1200   GFRNONAA 66 05/28/2018 1200   GFRAA 76 05/28/2018 1200   No results found for: HGBA1C No results found for: INSULIN CBC No results found for: WBC, RBC, HGB, HCT, PLT, MCV, MCH, MCHC, RDW, LYMPHSABS, MONOABS, EOSABS, BASOSABS Iron/TIBC/Ferritin/ %Sat No results found for: IRON, TIBC, FERRITIN, IRONPCTSAT Lipid Panel     Component Value Date/Time   CHOL 152 10/14/2011 0931   TRIG 108.0 10/14/2011 0931   HDL 43.90 10/14/2011 0931   CHOLHDL 3 10/14/2011 0931   VLDL 21.6 10/14/2011 0931   LDLCALC 87 10/14/2011 0931   Hepatic Function Panel     Component Value Date/Time   PROT 6.9 05/28/2018 1200   ALBUMIN 4.5 05/28/2018 1200   AST 28 05/28/2018 1200   ALT 28 05/28/2018 1200   ALKPHOS 83 05/28/2018 1200   BILITOT 0.6 05/28/2018 1200   BILIDIR 0.0 10/14/2011 0931   No results found for: TSH Results for Sandra [name], Sandra [name]  Sandra [name] (MRN [id number]) as of 09/03/2018 10:52  Ref. Range 05/28/2018 12:00  Vitamin D, 25-Hydroxy Latest Ref Range: 30.0 - 100.0 ng/mL 72.9     OBESITY BEHAVIORAL  INTERVENTION VISIT  Today's visit was # 7   Starting weight: 180 lbs Starting date: 05/28/2018 Today's weight : 164 lbs Today's date: 09/03/2018 Total lbs lost to date: 16 At least 15 minutes were spent on discussing the following behavioral intervention visit.   ASK: We discussed the diagnosis of obesity with [name] today and Prestyn agreed to give [name] permission to discuss obesity behavioral modification therapy today.  ASSESS: Cristal has the diagnosis of obesity and her BMI today is 16 [place] is in the action stage of change   ADVISE: Temisha was educated on the multiple health risks of obesity as well as the benefit of weight loss to improve her health. She was advised of the need for long term treatment and the importance of lifestyle modifications to improve her current health and to decrease her risk of future health problems.  AGREE: Multiple dietary modification options and treatment options were discussed and  Shavonn agreed to follow the recommendations documented in the above note.  ARRANGE: Alaynah was educated on the importance of frequent visits to treat obesity as outlined per CMS and USPSTF guidelines and agreed to schedule her next follow up appointment today.  Corey Skains, am acting as Location manager for [employer]. Owens Shark, DO  I have reviewed the above documentation for accuracy and completeness, and I agree with the above. -[name], DO

## 2018-09-13 ENCOUNTER — Ambulatory Visit
Admission: RE | Admit: 2018-09-13 | Discharge: 2018-09-13 | Disposition: A | Discharge status: Home / Self Care | Payer: Medicare HMO | Source: Ambulatory Visit | Attending: Family Medicine | Admitting: Family Medicine

## 2018-09-13 DIAGNOSIS — Z1231 Encounter for screening mammogram for malignant neoplasm of breast: Secondary | ICD-10-CM | POA: Diagnosis not present

## 2018-09-16 DIAGNOSIS — R69 Illness, unspecified: Secondary | ICD-10-CM | POA: Diagnosis not present

## 2018-09-17 NOTE — Progress Notes (Signed)
Sandra [name] Date of Birth: Jan 22, 1946   History of Present Illness: Sandra [name] is seen for followup CAD. She was last seen in Aug 2017. She has a history of coronary disease and is status post stenting of the ostium of the right coronary in 2004. She has a history of hypercholesterolemia and tobacco abuse. She has DM on insulin. She has an occluded IVC noted incidentally on an MRI and CT scans. 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 She has a history of DVT involving the left posterior tibial vein. This occurred after receiving treatments of injections and laser therapy for varicose veins.   She did quit smoking before but had a recent relapse. More recently she is on Chantix and hasn't smoked in a month. Her last Myoview in in 2017 was normal.  On follow up today she is doing well from a cardiac standpoint. She denies any chest pain or dyspnea. No palpitations. She is now in the weigh management program at [facility] and reports a 20 lb weight loss since October. She is walking daily. She was having a lot of myalgias on Crestor. Now only taking once a week. She did have recent carotid dopplers showing 60-79% left ICA stenosis.   Current Outpatient Medications on File Prior to Visit  Medication Sig Dispense Refill  . amitriptyline (ELAVIL) 25 MG tablet Take one to three (1-3) tablets (25 mg to 75 mg total) by mouth twice daily as needed for back pain.    Marland Kitchen aspirin EC 81 MG tablet Take 1 tablet (81 mg total) by mouth daily. 90 tablet 3  . BIOTIN PO Take by mouth.    Marland Kitchen CALCIUM PO Take by mouth daily. Reported on 02/24/2016    . cetirizine (ZYRTEC) 10 MG tablet Take 10 mg by mouth daily.    . Cinnamon 500 MG capsule Take 500 mg by mouth daily.    Marland Kitchen co-enzyme Q-10 30 MG capsule Take 30 mg by mouth 3 (three) times daily.    . diclofenac sodium (VOLTAREN) 1 % GEL Apply topically 4 (four) times daily.    . empagliflozin (JARDIANCE) 25 MG TABS tablet Take 25 mg by mouth daily.     . fluticasone (FLONASE) 50 MCG/ACT nasal spray Place 2 sprays into the nose daily.    Marland Kitchen guaiFENesin (MUCINEX) 600 MG 12 hr tablet Take 600 mg by mouth daily.      Marland Kitchen ibuprofen (ADVIL,MOTRIN) 100 MG tablet Take 100 mg by mouth every 6 (six) hours as needed.      . Insulin Glargine (BASAGLAR KWIKPEN [place]) Inject 10 Units into the skin.     Marland Kitchen liraglutide (VICTOZA) 18 MG/3ML SOPN Inject 1.8 mg into the skin daily.    Marland Kitchen lisinopril (PRINIVIL,ZESTRIL) 2.5 MG tablet Take 2.5 mg by mouth daily.    . metoprolol (TOPROL-XL) 50 MG 24 hr tablet Take 25 mg by mouth daily.     . Multiple Vitamin (MULTIVITAMIN) tablet Take 1 tablet by mouth daily. Reported on 02/24/2016    . oxybutynin (DITROPAN-XL) 10 MG 24 hr tablet Take 10 mg by mouth at bedtime.    . rosuvastatin (CRESTOR) 10 MG tablet Take 10 mg by mouth once a week.     Marland Kitchen tiZANidine (ZANAFLEX) 4 MG tablet Take 4 mg by mouth every 6 (six) hours as needed for muscle spasms (back pain).    . varenicline (CHANTIX STARTING MONTH PAK) 0.5 MG X 11 & 1 MG X 42 tablet  Take one 0.5 mg tablet by mouth once daily for 3 days, then increase to one 0.5 mg tablet twice daily for 4 days, then increase to one 1 mg tablet twice daily. 53 tablet 0  . VITAMIN D, CHOLECALCIFEROL, PO Take 2,000 mg by mouth daily.      No current facility-administered medications on file prior to visit.     Allergies  Allergen Reactions  . Erythromycin   . Epinephrine Palpitations  . Metformin And Related Rash    Past Medical History:  Diagnosis Date  . Arthritis   . Back pain   . Chronic kidney disease   . Congenital absence of left kidney   . Congenital absence of left ovary   . Coronary artery disease 2004   STATUS POST STENTING OF THE RIGHT CORONARY OSTIUM   . DDD (degenerative disc disease)   . Diabetes mellitus without complication ([place])   . Dislocated shoulder    left  . DVT (deep venous thrombosis) (HCC)    left posterior tibial vein  . High blood pressure   .  Hypercholesterolemia   . Joint pain   . Osteoarthritis   . Peripheral vascular disease ([place])   . Sleep apnea   . Sleep apnea   . SOB (shortness of breath)   . Tobacco dependence   . Vitamin D deficiency     Past Surgical History:  Procedure Laterality Date  . ANGIOPLASTY  2004  . bone spur shoulder    . FOOT SURGERY     plantar fascitis  . KNEE SURGERY  1950  . TOTAL ABDOMINAL HYSTERECTOMY    . TUBAL LIGATION    . VEIN BYPASS SURGERY     2014    Social History   Tobacco Use  Smoking Status Former Smoker  . Packs/day: 1.00  . Last attempt to quit: 10/27/2015  . Years since quitting: 2.9  Smokeless Tobacco Never Used    Social History   Substance and Sexual Activity  Alcohol Use No  . Alcohol/week: 0.0 standard drinks    Family History  Problem Relation Age of Onset  . Leukemia Sister   . Hypertension Brother   . Heart disease Father        before age 37  . High blood pressure Father   . Sudden death Father   . Breast cancer Neg Hx     Review of Systems: As noted in history of present illness.  All other systems were reviewed and are negative.  Physical Exam: BP 108/72   Pulse 86   Ht 5\' 2"  (1.575 m)   Wt 170 lb 3.2 oz (77.2 kg)   BMI 31.13 kg/m  GENERAL:  Well appearing obese WF in NAD HEENT:  PERRL, EOMI, sclera are clear. Oropharynx is clear. NECK:  No jugular venous distention, carotid upstroke brisk and symmetric, soft left carotid  bruit, no thyromegaly or adenopathy LUNGS:  Clear to auscultation bilaterally CHEST:  Unremarkable HEART:  RRR,  PMI not displaced or sustained,S1 and S2 within normal limits, no S3, no S4: no clicks, no rubs, no murmurs ABD:  Soft, nontender. BS +, no masses or bruits. No hepatomegaly, no splenomegaly EXT:  2 + pulses throughout, no edema, no cyanosis no clubbing SKIN:  Warm and dry.  No rashes NEURO:  Alert and oriented x 3. Cranial nerves II through XII intact. PSYCH:  Cognitively intact    LABORATORY  DATA:  Lab Results  Component Value Date   GLUCOSE 96 05/28/2018  CHOL 152 10/14/2011   TRIG 108.0 10/14/2011   HDL 43.90 10/14/2011   LDLCALC 87 10/14/2011   ALT 28 05/28/2018   AST 28 05/28/2018   NA 143 05/28/2018   K 4.4 05/28/2018   CL 102 05/28/2018   CREATININE 0.87 05/28/2018   BUN 14 05/28/2018   CO2 24 05/28/2018    ECG 05/28/18 demonstrates normal sinus rhythm with a LAD. Poor R wave progression.  I have personally reviewed and interpreted this study.  Labs reviewed from primary care September 2016. Normal CMET. Cholesterol 135, trig- 247, HDL 31, LDL 55.  Dated 07/11/18: cholesterol 150, triglycerides 136, HDL 38, LDL 85.  Dated 08/17/18: A1c 6.7%. CBC and chemistries normal.   Myoview 05/17/16: Study Highlights     The left ventricular ejection fraction is hyperdynamic (>65%).  Nuclear stress EF: 68%.  There was no ST segment deviation noted during stress.  The study is normal.  This is a low risk study.   Normal resting and stress perfusion. No ischemia or infarction EF 68%     Assessment / Plan: 1. Coronary disease with remote stenting of the ostium of the right coronary. Last Myoview in 2017 was normal.  Continue  ASA  81 mg daily. Continue Toprol. Needs aggressive risk factor modification  2. Tobacco dependence. I've encouraged her with her efforts at smoking cessation. On Chantix.  3. Left carotid stenosis. Moderate. Asymptomatic. Will be seeing Dr. Scot Dock for follow up.  4. Occluded IVC. Congenital.  5. HTN controlled. Continue therapy  6. Dyslipidemia. On maximal tolerated statin once a week. Recommend adding Zetia 10 mg daily. Will  Repeat lipids in 3 months. Goal LDL < 70. If not at goal we should start her on a PCSK 9 inhibitor.   7. DM type 2 on insulin. Per primary care.   8. Morbid obesity. Encouraged efforts at weight loss program.

## 2018-09-18 ENCOUNTER — Ambulatory Visit (INDEPENDENT_AMBULATORY_CARE_PROVIDER_SITE_OTHER): Payer: Medicare HMO | Admitting: Bariatrics

## 2018-09-18 ENCOUNTER — Encounter (INDEPENDENT_AMBULATORY_CARE_PROVIDER_SITE_OTHER): Payer: Self-pay

## 2018-09-20 ENCOUNTER — Encounter: Payer: Self-pay | Admitting: Cardiology

## 2018-09-20 ENCOUNTER — Ambulatory Visit (INDEPENDENT_AMBULATORY_CARE_PROVIDER_SITE_OTHER): Payer: Medicare HMO | Admitting: Cardiology

## 2018-09-20 ENCOUNTER — Encounter

## 2018-09-20 VITALS — BP 108/72 | HR 86 | Ht 62.0 in | Wt 170.2 lb

## 2018-09-20 DIAGNOSIS — I6522 Occlusion and stenosis of left carotid artery: Secondary | ICD-10-CM | POA: Diagnosis not present

## 2018-09-20 DIAGNOSIS — I25118 Atherosclerotic heart disease of native coronary artery with other forms of angina pectoris: Secondary | ICD-10-CM | POA: Diagnosis not present

## 2018-09-20 DIAGNOSIS — Z794 Long term (current) use of insulin: Secondary | ICD-10-CM | POA: Diagnosis not present

## 2018-09-20 DIAGNOSIS — F17200 Nicotine dependence, unspecified, uncomplicated: Secondary | ICD-10-CM

## 2018-09-20 DIAGNOSIS — I10 Essential (primary) hypertension: Secondary | ICD-10-CM | POA: Diagnosis not present

## 2018-09-20 DIAGNOSIS — E119 Type 2 diabetes mellitus without complications: Secondary | ICD-10-CM | POA: Diagnosis not present

## 2018-09-20 DIAGNOSIS — E7800 Pure hypercholesterolemia, unspecified: Secondary | ICD-10-CM

## 2018-09-20 DIAGNOSIS — R69 Illness, unspecified: Secondary | ICD-10-CM | POA: Diagnosis not present

## 2018-09-20 MED ORDER — EZETIMIBE 10 MG PO TABS: 10.0000 mg | ORAL_TABLET | Freq: Every day | ORAL | 3 refills | Status: DC

## 2018-09-20 NOTE — Patient Instructions (Signed)
Start Zetia 10 mg daily for cholesterol   We will repeat lipids in 3 months

## 2018-09-24 ENCOUNTER — Ambulatory Visit (INDEPENDENT_AMBULATORY_CARE_PROVIDER_SITE_OTHER): Payer: Medicare HMO | Admitting: Bariatrics

## 2018-09-24 ENCOUNTER — Encounter (INDEPENDENT_AMBULATORY_CARE_PROVIDER_SITE_OTHER): Payer: Self-pay | Admitting: Bariatrics

## 2018-09-24 VITALS — BP 138/72 | HR 80 | Temp 97.6°F | Ht 61.0 in | Wt 165.0 lb

## 2018-09-24 DIAGNOSIS — Z794 Long term (current) use of insulin: Secondary | ICD-10-CM

## 2018-09-24 DIAGNOSIS — Z6831 Body mass index (BMI) 31.0-31.9, adult: Secondary | ICD-10-CM | POA: Diagnosis not present

## 2018-09-24 DIAGNOSIS — E119 Type 2 diabetes mellitus without complications: Secondary | ICD-10-CM

## 2018-09-24 DIAGNOSIS — E669 Obesity, unspecified: Secondary | ICD-10-CM

## 2018-09-24 DIAGNOSIS — I10 Essential (primary) hypertension: Secondary | ICD-10-CM

## 2018-09-24 DIAGNOSIS — E7849 Other hyperlipidemia: Secondary | ICD-10-CM

## 2018-09-25 NOTE — Progress Notes (Signed)
Office: [phone]  /  Fax: [phone]   HPI:   Chief Complaint: OBESITY Sandra [name] is here to discuss her progress with her obesity treatment plan. She is on the Category 1 plan with intermittent journaling and is following her eating plan approximately 70 % of the time. She states she is getting in 6500 steps 7 times per week. Sandra [name] is doing well overall. She has not been journaling enough as she has had problems with her [employer], nature. She has increased in water weight approximately 1 lbs. Her weight is 165 lb (74.8 kg) today and has gained 1 lbs since her last visit. She has lost 15 lbs since starting treatment with [name].  Diabetes II Sandra [name] has a diagnosis of diabetes type II. Sandra [name] states that fasting BGs range in the 120's and 2 hour post postprandial in the 135's and denies any hypoglycemic episodes. Last A1c was 6.7 on 08/17/2018. She is taking Basaglar 16 units, Jardiance and Victoza. She has been working on intensive lifestyle modifications including diet, exercise, and weight loss to help control her blood glucose levels.  Hyperlipidemia Sandra [name] has hyperlipidemia and has been trying to improve her cholesterol levels with intensive lifestyle modification including a low saturated fat diet, exercise and weight loss. She denies any chest pain, claudication or myalgias. She is taking Crestor once weekly and Zetia daily.   Hypertension Sandra [name] is a 73 y.o. female with hypertension.  Sandra [name] denies chest pain. She is working weight loss to help control her blood pressure with the goal of decreasing her risk of heart attack and stroke. Sandra [name] blood pressure is currently controlled. She is taking Lisinopril.  ASSESSMENT AND PLAN:  Type 2 diabetes mellitus without complication, with long-term current use of insulin (HCC)  Other hyperlipidemia  Essential hypertension  Class 1 obesity with serious comorbidity and body mass index (BMI) of 31.0 to 31.9 in adult,  unspecified obesity type  PLAN:  Diabetes II Sandra [name] has been given extensive diabetes education by myself today including ideal fasting and post-prandial blood glucose readings, individual ideal Hgb A1c goals  and hypoglycemia prevention. We discussed the importance of good blood sugar control to decrease the likelihood of diabetic complications such as nephropathy, neuropathy, limb loss, blindness, coronary artery disease, and death. We discussed the importance of intensive lifestyle modification including diet, exercise and weight loss as the first line treatment for diabetes. Sandra [name] agrees to continue her diabetes medications and check Fasting blood sugars and fasting blood sugars 2 hour post postprandial. Sandra [name] agrees to follow up with our clinic in 2 weeks.  Hyperlipidemia Sandra [name] was informed of the American Heart Association Guidelines emphasizing intensive lifestyle modifications as the first line treatment for hyperlipidemia. We discussed many lifestyle modifications today in depth, and Sandra [name] will continue to work on decreasing saturated fats such as fatty red meat, butter and many fried foods. She will also increase vegetables and lean protein in her diet and continue to work on exercise and weight loss efforts. She will continue taking current medication and Coq10. Sandra [name] agrees to follow up with our clinic in 2 weeks.  Hypertension We discussed sodium restriction, working on healthy weight loss, and a regular exercise program as the means to achieve improved blood pressure control. Sandra [name] agreed with this plan and agreed to follow up as directed. We will continue to monitor her blood pressure as well as her progress with the above lifestyle modifications. She will continue her medications and will watch for signs of  hypotension as she continues her lifestyle modifications. Sandra [name] agrees to follow up with our clinic in 2 weeks.  Obesity Sandra [name] is currently in the action stage of change. As  such, her goal is to continue with weight loss efforts She has agreed to follow the Category 1 plan with intermittent journaling  has been instructed to continue with activity, work up to a goal of 150 minutes of combined cardio and strengthening exercise per week for weight loss and overall health benefits. We discussed the following Behavioral Modification Strategies today: increasing lean protein intake, decreasing simple carbohydrates, increasing vegetables and work on meal planning and easy cooking plans,increase adequate H2O intake, keeping healthy foods in the home, decrease eating out. Sandra [name] agrees to restart journaling.  Sandra [name] has agreed to follow up with our clinic in 2 weeks. She was informed of the importance of frequent follow up visits to maximize her success with intensive lifestyle modifications for her multiple health conditions.  ALLERGIES: Allergies  Allergen Reactions  . Erythromycin   . Epinephrine Palpitations  . Metformin And Related Rash    MEDICATIONS: Current Outpatient Medications on File Prior to Visit  Medication Sig Dispense Refill  . amitriptyline (ELAVIL) 25 MG tablet Take one to three (1-3) tablets (25 mg to 75 mg total) by mouth twice daily as needed for back pain.    Marland Kitchen aspirin EC 81 MG tablet Take 1 tablet (81 mg total) by mouth daily. 90 tablet 3  . BIOTIN PO Take by mouth.    Marland Kitchen CALCIUM PO Take by mouth daily. Reported on 02/24/2016    . cetirizine (ZYRTEC) 10 MG tablet Take 10 mg by mouth daily.    . Cinnamon 500 MG capsule Take 500 mg by mouth daily.    Marland Kitchen co-enzyme Q-10 30 MG capsule Take 30 mg by mouth 3 (three) times daily.    . diclofenac sodium (VOLTAREN) 1 % GEL Apply topically 4 (four) times daily.    . empagliflozin (JARDIANCE) 25 MG TABS tablet Take 25 mg by mouth daily.    Marland Kitchen ezetimibe (ZETIA) 10 MG tablet Take 1 tablet (10 mg total) by mouth daily. 90 tablet 3  . fluticasone (FLONASE) 50 MCG/ACT nasal spray Place 2 sprays into the nose  daily.    Marland Kitchen guaiFENesin (MUCINEX) 600 MG 12 hr tablet Take 600 mg by mouth daily.      Marland Kitchen ibuprofen (ADVIL,MOTRIN) 100 MG tablet Take 100 mg by mouth every 6 (six) hours as needed.      . Insulin Glargine (BASAGLAR KWIKPEN [place]) Inject 10 Units into the skin.     Marland Kitchen liraglutide (VICTOZA) 18 MG/3ML SOPN Inject 1.8 mg into the skin daily.    Marland Kitchen lisinopril (PRINIVIL,ZESTRIL) 2.5 MG tablet Take 2.5 mg by mouth daily.    . metoprolol (TOPROL-XL) 50 MG 24 hr tablet Take 25 mg by mouth daily.     . Multiple Vitamin (MULTIVITAMIN) tablet Take 1 tablet by mouth daily. Reported on 02/24/2016    . oxybutynin (DITROPAN-XL) 10 MG 24 hr tablet Take 10 mg by mouth at bedtime.    . rosuvastatin (CRESTOR) 10 MG tablet Take 10 mg by mouth once a week.     Marland Kitchen tiZANidine (ZANAFLEX) 4 MG tablet Take 4 mg by mouth every 6 (six) hours as needed for muscle spasms (back pain).    . varenicline (CHANTIX STARTING MONTH PAK) 0.5 MG X 11 & 1 MG X 42 tablet Take one 0.5 mg tablet by mouth once daily for 3  days, then increase to one 0.5 mg tablet twice daily for 4 days, then increase to one 1 mg tablet twice daily. 53 tablet 0  . VITAMIN D, CHOLECALCIFEROL, PO Take 2,000 mg by mouth daily.      No current facility-administered medications on file prior to visit.     PAST MEDICAL HISTORY: Past Medical History:  Diagnosis Date  . Arthritis   . Back pain   . Chronic kidney disease   . Congenital absence of left kidney   . Congenital absence of left ovary   . Coronary artery disease 2004   STATUS POST STENTING OF THE RIGHT CORONARY OSTIUM   . DDD (degenerative disc disease)   . Diabetes mellitus without complication ([place])   . Dislocated shoulder    left  . DVT (deep venous thrombosis) (HCC)    left posterior tibial vein  . High blood pressure   . Hypercholesterolemia   . Joint pain   . Osteoarthritis   . Peripheral vascular disease ([place])   . Sleep apnea   . Sleep apnea   . SOB (shortness of breath)   . Tobacco  dependence   . Vitamin D deficiency     PAST SURGICAL HISTORY: Past Surgical History:  Procedure Laterality Date  . ANGIOPLASTY  2004  . bone spur shoulder    . FOOT SURGERY     plantar fascitis  . KNEE SURGERY  1950  . TOTAL ABDOMINAL HYSTERECTOMY    . TUBAL LIGATION    . VEIN BYPASS SURGERY     2014    SOCIAL HISTORY: Social History   Tobacco Use  . Smoking status: Former Smoker    Packs/day: 1.00    Last attempt to quit: 10/27/2015    Years since quitting: 2.9  . Smokeless tobacco: Never Used  Substance Use Topics  . Alcohol use: No    Alcohol/week: 0.0 standard drinks  . Drug use: No    FAMILY HISTORY: Family History  Problem Relation Age of Onset  . Leukemia Sister   . Hypertension Brother   . Heart disease Father        before age 62  . High blood pressure Father   . Sudden death Father   . Breast cancer Neg Hx     ROS: Review of Systems  Constitutional: Negative for weight loss.  Cardiovascular: Negative for chest pain.  Gastrointestinal: Negative for diarrhea, nausea and vomiting.  Musculoskeletal: Negative for myalgias.  Endo/Heme/Allergies:       Negative for hypoglycemia     PHYSICAL EXAM: Blood pressure 138/72, pulse 80, temperature 97.6 F (36.4 C), temperature source Oral, height 5\' 1"  (1.549 m), weight 165 lb (74.8 kg), SpO2 94 %. Body mass index is 31.18 kg/m. Physical Exam Vitals signs reviewed.  Constitutional:      Appearance: Normal appearance. She is obese.  Cardiovascular:     Rate and Rhythm: Normal rate.     Pulses: Normal pulses.  Pulmonary:     Effort: Pulmonary effort is normal.  Musculoskeletal: Normal range of motion.  Skin:    General: Skin is warm and dry.  Neurological:     Mental Status: She is alert and oriented to person, place, and time.  Psychiatric:        Mood and Affect: Mood normal.        Behavior: Behavior normal.     RECENT LABS AND TESTS: BMET    Component Value Date/Time   NA 143  05/28/2018 1200  K 4.4 05/28/2018 1200   CL 102 05/28/2018 1200   CO2 24 05/28/2018 1200   GLUCOSE 96 05/28/2018 1200   GLUCOSE 115 (H) 10/14/2011 0931   BUN 14 05/28/2018 1200   CREATININE 0.87 05/28/2018 1200   CALCIUM 9.6 05/28/2018 1200   GFRNONAA 66 05/28/2018 1200   GFRAA 76 05/28/2018 1200   No results found for: HGBA1C No results found for: INSULIN CBC No results found for: WBC, RBC, HGB, HCT, PLT, MCV, MCH, MCHC, RDW, LYMPHSABS, MONOABS, EOSABS, BASOSABS Iron/TIBC/Ferritin/ %Sat No results found for: IRON, TIBC, FERRITIN, IRONPCTSAT Lipid Panel     Component Value Date/Time   CHOL 152 10/14/2011 0931   TRIG 108.0 10/14/2011 0931   HDL 43.90 10/14/2011 0931   CHOLHDL 3 10/14/2011 0931   VLDL 21.6 10/14/2011 0931   LDLCALC 87 10/14/2011 0931   Hepatic Function Panel     Component Value Date/Time   PROT 6.9 05/28/2018 1200   ALBUMIN 4.5 05/28/2018 1200   AST 28 05/28/2018 1200   ALT 28 05/28/2018 1200   ALKPHOS 83 05/28/2018 1200   BILITOT 0.6 05/28/2018 1200   BILIDIR 0.0 10/14/2011 0931   No results found for: TSH    OBESITY BEHAVIORAL INTERVENTION VISIT  Today's visit was # 8   Starting weight: 180 lbs Starting date: 05/28/2018 Today's weight :: 165 lbs Today's date: 09/24/2018 Total lbs lost to date: 15 At least 15 minutes were spent on discussing the following behavioral intervention visit.  ASK: We discussed the diagnosis of obesity with Sandra [name] today and Sandra [name] agreed to give [name] permission to discuss obesity behavioral modification therapy today.  ASSESS: Sandra [name] has the diagnosis of obesity and her BMI today is 31.19 Sandra [name] is in the action stage of change   ADVISE: Sandra [name] was educated on the multiple health risks of obesity as well as the benefit of weight loss to improve her health. She was advised of the need for long term treatment and the importance of lifestyle modifications to improve her current health and to decrease her risk  of future health problems.  AGREE: Multiple dietary modification options and treatment options were discussed and  Sandra [name] agreed to follow the recommendations documented in the above note.  ARRANGE: Sandra [name] was educated on the importance of frequent visits to treat obesity as outlined per CMS and USPSTF guidelines and agreed to schedule her next follow up appointment today.  I, Tammy Wysor, am acting as Location manager for [employer], DO  I have reviewed the above documentation for accuracy and completeness, and I agree with the above. -[name], DO

## 2018-10-11 ENCOUNTER — Encounter (INDEPENDENT_AMBULATORY_CARE_PROVIDER_SITE_OTHER): Payer: Self-pay | Admitting: Bariatrics

## 2018-10-11 ENCOUNTER — Ambulatory Visit (INDEPENDENT_AMBULATORY_CARE_PROVIDER_SITE_OTHER): Payer: Medicare HMO | Admitting: Bariatrics

## 2018-10-11 VITALS — BP 104/65 | HR 80 | Ht 61.0 in | Wt 163.0 lb

## 2018-10-11 DIAGNOSIS — E669 Obesity, unspecified: Secondary | ICD-10-CM | POA: Diagnosis not present

## 2018-10-11 DIAGNOSIS — E119 Type 2 diabetes mellitus without complications: Secondary | ICD-10-CM

## 2018-10-11 DIAGNOSIS — Z6830 Body mass index (BMI) 30.0-30.9, adult: Secondary | ICD-10-CM

## 2018-10-11 DIAGNOSIS — I10 Essential (primary) hypertension: Secondary | ICD-10-CM

## 2018-10-15 DIAGNOSIS — R69 Illness, unspecified: Secondary | ICD-10-CM | POA: Diagnosis not present

## 2018-10-15 NOTE — Progress Notes (Signed)
Office: [phone]  /  Fax: [phone]   HPI:   Chief Complaint: OBESITY Sandra [name] is here to discuss her progress with her obesity treatment plan. She is on the Category 1 plan and is following her eating plan approximately 70 % of the time. She states she is walking 9,000 steps 5 times per week. Sandra [name] is doing well overall. She is exercising well and is not struggling at this time.  Her weight is 163 lb (73.9 kg) today and has had a weight loss of 2 pounds over a period of 3 weeks since her last visit. She has lost 17 lbs since starting treatment with [name].  Diabetes II Sandra [name] has a diagnosis of diabetes type II. Sandra [name] states that her fasting BGs have been ranging between 100 and 115. She is taking Basaglar 16 units, Jardiance 25mg , and liraglutide 1.8mg . She denies any hypoglycemic episodes. She has been working on intensive lifestyle modifications including diet, exercise, and weight loss to help control her blood glucose levels.  Hypertension Sandra [name] is a 73 y.o. female with hypertension. Mazzy's blood pressure is currently well controlled and she is taking lisinopril 2.5mg . She is working on weight loss to help control her blood pressure with the goal of decreasing her risk of heart attack and stroke.   ASSESSMENT AND PLAN:  Type 2 diabetes mellitus without complication, without long-term current use of insulin (HCC)  Essential hypertension  Class 1 obesity with serious comorbidity and body mass index (BMI) of 30.0 to 30.9 in adult, unspecified obesity type  PLAN:  Diabetes II Sandra [name] has been given extensive diabetes education by myself today including ideal fasting and post-prandial blood glucose readings, individual ideal Hgb A1c goals, and hypoglycemia prevention. We discussed the importance of good blood sugar control to decrease the likelihood of diabetic complications such as nephropathy, neuropathy, limb loss, blindness, coronary artery disease, and death. We  discussed the importance of intensive lifestyle modification including diet, exercise and weight loss as the first line treatment for diabetes. Sandra [name] agrees to continue her diabetes medications and will follow up at the agreed upon time in 2 weeks.  Hypertension We discussed sodium restriction, working on healthy weight loss, and a regular exercise program as the means to achieve improved blood pressure control. We will continue to monitor her blood pressure as well as her progress with the above lifestyle modifications. She will continue her medications as prescribed and will watch for signs of hypotension as she continues her lifestyle modifications. Sandra [name] agreed with this plan and agreed to follow up as directed.  Obesity Sandra [name] is currently in the action stage of change. As such, her goal is to continue with weight loss efforts. She has agreed to follow the Category 1 plan with Additional Lunch Options handout given. Sandra [name] has been instructed to work up to a goal of 150 minutes of combined cardio and strengthening exercise per week for weight loss and overall health benefits. We discussed the following Behavioral Modification Strategies today: increasing lean protein intake, decreasing simple carbohydrates, increasing vegetables, increase H20 intake, and work on meal planning and easy cooking plans.  Sandra [name] has agreed to follow up with our clinic in 2 weeks. She was informed of the importance of frequent follow up visits to maximize her success with intensive lifestyle modifications for her multiple health conditions.  ALLERGIES: Allergies  Allergen Reactions  . Erythromycin   . Epinephrine Palpitations  . Metformin And Related Rash    MEDICATIONS: Current Outpatient Medications on File  Prior to Visit  Medication Sig Dispense Refill  . amitriptyline (ELAVIL) 25 MG tablet Take one to three (1-3) tablets (25 mg to 75 mg total) by mouth twice daily as needed for back pain.    Marland Kitchen aspirin  EC 81 MG tablet Take 1 tablet (81 mg total) by mouth daily. 90 tablet 3  . BIOTIN PO Take by mouth.    Marland Kitchen CALCIUM PO Take by mouth daily. Reported on 02/24/2016    . cetirizine (ZYRTEC) 10 MG tablet Take 10 mg by mouth daily.    . Cinnamon 500 MG capsule Take 500 mg by mouth daily.    Marland Kitchen co-enzyme Q-10 30 MG capsule Take 30 mg by mouth 3 (three) times daily.    . diclofenac sodium (VOLTAREN) 1 % GEL Apply topically 4 (four) times daily.    . empagliflozin (JARDIANCE) 25 MG TABS tablet Take 25 mg by mouth daily.    Marland Kitchen ezetimibe (ZETIA) 10 MG tablet Take 1 tablet (10 mg total) by mouth daily. 90 tablet 3  . fluticasone (FLONASE) 50 MCG/ACT nasal spray Place 2 sprays into the nose daily.    Marland Kitchen guaiFENesin (MUCINEX) 600 MG 12 hr tablet Take 600 mg by mouth daily.      Marland Kitchen ibuprofen (ADVIL,MOTRIN) 100 MG tablet Take 100 mg by mouth every 6 (six) hours as needed.      . Insulin Glargine (BASAGLAR KWIKPEN [place]) Inject 16 Units into the skin.     Marland Kitchen liraglutide (VICTOZA) 18 MG/3ML SOPN Inject 1.8 mg into the skin daily.    Marland Kitchen lisinopril (PRINIVIL,ZESTRIL) 2.5 MG tablet Take 2.5 mg by mouth daily.    . metoprolol (TOPROL-XL) 50 MG 24 hr tablet Take 25 mg by mouth daily.     . Multiple Vitamin (MULTIVITAMIN) tablet Take 1 tablet by mouth daily. Reported on 02/24/2016    . oxybutynin (DITROPAN-XL) 10 MG 24 hr tablet Take 10 mg by mouth at bedtime.    . rosuvastatin (CRESTOR) 10 MG tablet Take 10 mg by mouth once a week.     Marland Kitchen tiZANidine (ZANAFLEX) 4 MG tablet Take 4 mg by mouth every 6 (six) hours as needed for muscle spasms (back pain).    . varenicline (CHANTIX STARTING MONTH PAK) 0.5 MG X 11 & 1 MG X 42 tablet Take one 0.5 mg tablet by mouth once daily for 3 days, then increase to one 0.5 mg tablet twice daily for 4 days, then increase to one 1 mg tablet twice daily. 53 tablet 0  . VITAMIN D, CHOLECALCIFEROL, PO Take 2,000 mg by mouth daily.      No current facility-administered medications on file prior to  visit.     PAST MEDICAL HISTORY: Past Medical History:  Diagnosis Date  . Arthritis   . Back pain   . Chronic kidney disease   . Congenital absence of left kidney   . Congenital absence of left ovary   . Coronary artery disease 2004   STATUS POST STENTING OF THE RIGHT CORONARY OSTIUM   . DDD (degenerative disc disease)   . Diabetes mellitus without complication ([place])   . Dislocated shoulder    left  . DVT (deep venous thrombosis) (HCC)    left posterior tibial vein  . High blood pressure   . Hypercholesterolemia   . Joint pain   . Osteoarthritis   . Peripheral vascular disease ([place])   . Sleep apnea   . Sleep apnea   . SOB (shortness of breath)   .  Tobacco dependence   . Vitamin D deficiency     PAST SURGICAL HISTORY: Past Surgical History:  Procedure Laterality Date  . ANGIOPLASTY  2004  . bone spur shoulder    . FOOT SURGERY     plantar fascitis  . KNEE SURGERY  1950  . TOTAL ABDOMINAL HYSTERECTOMY    . TUBAL LIGATION    . VEIN BYPASS SURGERY     2014    SOCIAL HISTORY: Social History   Tobacco Use  . Smoking status: Former Smoker    Packs/day: 1.00    Last attempt to quit: 10/27/2015    Years since quitting: 2.9  . Smokeless tobacco: Never Used  Substance Use Topics  . Alcohol use: No    Alcohol/week: 0.0 standard drinks  . Drug use: No    FAMILY HISTORY: Family History  Problem Relation Age of Onset  . Leukemia Sister   . Hypertension Brother   . Heart disease Father        before age 67  . High blood pressure Father   . Sudden death Father   . Breast cancer Neg Hx     ROS: Review of Systems  Constitutional: Positive for weight loss.  Endo/Heme/Allergies:       Negative for hypoglycemia.    PHYSICAL EXAM: Blood pressure 104/65, pulse 80, height 5\' 1"  (1.549 m), weight 163 lb (73.9 kg), SpO2 95 %. Body mass index is 30.8 kg/m. Physical Exam Vitals signs reviewed.  Constitutional:      Appearance: Normal appearance. She is obese.    Cardiovascular:     Rate and Rhythm: Normal rate.  Pulmonary:     Effort: Pulmonary effort is normal.  Musculoskeletal: Normal range of motion.  Skin:    General: Skin is warm and dry.  Neurological:     Mental Status: She is alert and oriented to person, place, and time.  Psychiatric:        Mood and Affect: Mood normal.        Behavior: Behavior normal.     RECENT LABS AND TESTS: BMET    Component Value Date/Time   NA 143 05/28/2018 1200   K 4.4 05/28/2018 1200   CL 102 05/28/2018 1200   CO2 24 05/28/2018 1200   GLUCOSE 96 05/28/2018 1200   GLUCOSE 115 (H) 10/14/2011 0931   BUN 14 05/28/2018 1200   CREATININE 0.87 05/28/2018 1200   CALCIUM 9.6 05/28/2018 1200   GFRNONAA 66 05/28/2018 1200   GFRAA 76 05/28/2018 1200   No results found for: HGBA1C No results found for: INSULIN CBC No results found for: WBC, RBC, HGB, HCT, PLT, MCV, MCH, MCHC, RDW, LYMPHSABS, MONOABS, EOSABS, BASOSABS Iron/TIBC/Ferritin/ %Sat No results found for: IRON, TIBC, FERRITIN, IRONPCTSAT Lipid Panel     Component Value Date/Time   CHOL 152 10/14/2011 0931   TRIG 108.0 10/14/2011 0931   HDL 43.90 10/14/2011 0931   CHOLHDL 3 10/14/2011 0931   VLDL 21.6 10/14/2011 0931   LDLCALC 87 10/14/2011 0931   Hepatic Function Panel     Component Value Date/Time   PROT 6.9 05/28/2018 1200   ALBUMIN 4.5 05/28/2018 1200   AST 28 05/28/2018 1200   ALT 28 05/28/2018 1200   ALKPHOS 83 05/28/2018 1200   BILITOT 0.6 05/28/2018 1200   BILIDIR 0.0 10/14/2011 0931   No results found for: TSH  Results for Sindt, Nathali H "JUDY" (MRN [id number]) as of 10/15/2018 05:54  Ref. Range 05/28/2018 12:00  Vitamin D, 25-Hydroxy Latest  Ref Range: 30.0 - 100.0 ng/mL 72.9    OBESITY BEHAVIORAL INTERVENTION VISIT  Today's visit was # 9   Starting weight: 180 lbs Starting date: 05/28/18 Today's weight : Weight: 163 lb (73.9 kg)  Today's date: 10/21/2018 Total lbs lost to date: 17 At least 15 minutes were spent  on discussing the following behavioral intervention visit.  ASK: We discussed the diagnosis of obesity with [name] today and Shamikia agreed to give [name] permission to discuss obesity behavioral modification therapy today.  ASSESS: Sayra has the diagnosis of obesity and her BMI today is 30.8. Nylan is in the action stage of change.   ADVISE: Timira was educated on the multiple health risks of obesity as well as the benefit of weight loss to improve her health. She was advised of the need for long term treatment and the importance of lifestyle modifications to improve her current health and to decrease her risk of future health problems.  AGREE: Multiple dietary modification options and treatment options were discussed and Greta agreed to follow the recommendations documented in the above note.  ARRANGE: Evalyne was educated on the importance of frequent visits to treat obesity as outlined per CMS and USPSTF guidelines and agreed to schedule her next follow up appointment today.  I, Marcille Blanco. CMA, am acting as Location manager for [employer]. Harlie Buening,DO  I have reviewed the above documentation for accuracy and completeness, and I agree with the above. -[name], DO

## 2018-10-18 DIAGNOSIS — R69 Illness, unspecified: Secondary | ICD-10-CM | POA: Diagnosis not present

## 2018-10-24 ENCOUNTER — Ambulatory Visit (INDEPENDENT_AMBULATORY_CARE_PROVIDER_SITE_OTHER): Payer: Medicare HMO | Admitting: Vascular Surgery

## 2018-10-24 ENCOUNTER — Other Ambulatory Visit: Payer: Self-pay

## 2018-10-24 ENCOUNTER — Encounter: Payer: Self-pay | Admitting: Vascular Surgery

## 2018-10-24 VITALS — BP 101/68 | HR 94 | Resp 18 | Ht 61.0 in | Wt 163.0 lb

## 2018-10-24 DIAGNOSIS — I872 Venous insufficiency (chronic) (peripheral): Secondary | ICD-10-CM

## 2018-10-24 DIAGNOSIS — I6522 Occlusion and stenosis of left carotid artery: Secondary | ICD-10-CM | POA: Diagnosis not present

## 2018-10-24 NOTE — Progress Notes (Signed)
REASON FOR CONSULT:    Carotid stenosis.  The consult is requested by Dr. Theadore Nan.   HPI:   Sandra [name] is a pleasant 73 y.o. female, who was referred with carotid disease.  I reviewed the records from the referring office.  The patient was seen on 07/16/2018.  The patient was having an annual exam.  The patient has a history of diabetes hyperlipidemia.  These have been under good control.  The patient was noted to have a right carotid bruit.  The patient was set up for a carotid Doppler and a visit with [name].  Of note, I had seen the patient last in June 2017.  At that time she had a right carotid bruit which was I felt secondary to a mild stenosis in the right external carotid artery which was not clinically significant.  Back in 2017 she did not have a carotid stenosis on the left side.  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  She does not complain of some varicose veins in her right leg.  Of note she underwent endovenous laser ablation of the left great saphenous vein elsewhere and developed a DVT.  This was in 2014 and she was on Xarelto for 6 months.  She denies any significant pain associated with her varicose veins.  She denies any history of stroke, TIAs, expressive or receptive aphasia, or amaurosis fugax.  She denies any history of claudication, rest pain, or nonhealing ulcers.  She does have a history of smoking and most recently quit again 2 months ago.  She had smoked less than a pack per day for many years.  Past Medical History:  Diagnosis Date  . Arthritis   . Back pain   . Chronic kidney disease   . Congenital absence of left kidney   . Congenital absence of left ovary   . Coronary artery disease 2004   STATUS POST STENTING OF THE RIGHT CORONARY OSTIUM   . DDD (degenerative disc disease)   . Diabetes mellitus without  complication ([place])   . Dislocated shoulder    left  . DVT (deep venous thrombosis) (HCC)    left posterior tibial vein  . High blood pressure   . Hypercholesterolemia   . Joint pain   . Osteoarthritis   . Peripheral vascular disease ([place])   . Sleep apnea   . Sleep apnea   . SOB (shortness of breath)   . Tobacco dependence   . Vitamin D deficiency     Family History  Problem Relation Age of Onset  . Leukemia Sister   . Hypertension Brother   . Heart disease Father        before age 25  . High blood pressure Father   . Sudden death Father   . Breast cancer Neg Hx     SOCIAL HISTORY: Social History   Socioeconomic History  . Marital status: Widowed    Spouse name: Not on file  . Number of children: 2  . Years of education: Not on file  . Highest education level: Not on file  Occupational History  . 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 Financial resource strain: Not on file  . Food insecurity:    Worry: Not on file    Inability:  Not on file  . Transportation needs:    Medical: Not on file    Non-medical: Not on file  Tobacco Use  . Smoking status: Former Smoker    Packs/day: 1.00    Last attempt to quit: 10/27/2015    Years since quitting: 2.9  . Smokeless tobacco: Never Used  Substance and Sexual Activity  . Alcohol use: No    Alcohol/week: 0.0 standard drinks  . Drug use: No  . Sexual activity: Not on file  Lifestyle  . Physical activity:    Days per week: Not on file    Minutes per session: Not on file  . Stress: Not on file  Relationships  . Social connections:    Talks on phone: Not on file    Gets together: Not on file    Attends religious service: Not on file    Active member of club or organization: Not on file    Attends meetings of clubs or organizations: Not on file    Relationship status: Not on file  . Intimate partner violence:    Fear of current or ex partner: Not on file    Emotionally  abused: Not on file    Physically abused: Not on file    Forced sexual activity: Not on file  Other Topics Concern  . Not on file  Social History Narrative  . Not on file    Allergies  Allergen Reactions  . Erythromycin   . Epinephrine Palpitations  . Metformin And Related Rash    Current Outpatient Medications  Medication Sig Dispense Refill  . amitriptyline (ELAVIL) 25 MG tablet Take one to three (1-3) tablets (25 mg to 75 mg total) by mouth twice daily as needed for back pain.    Marland Kitchen aspirin EC 81 MG tablet Take 1 tablet (81 mg total) by mouth daily. 90 tablet 3  . BIOTIN PO Take by mouth.    Marland Kitchen CALCIUM PO Take by mouth daily. Reported on 02/24/2016    . cetirizine (ZYRTEC) 10 MG tablet Take 10 mg by mouth daily.    . Cinnamon 500 MG capsule Take 500 mg by mouth daily.    Marland Kitchen co-enzyme Q-10 30 MG capsule Take 30 mg by mouth 3 (three) times daily.    . diclofenac sodium (VOLTAREN) 1 % GEL Apply topically 4 (four) times daily.    . empagliflozin (JARDIANCE) 25 MG TABS tablet Take 25 mg by mouth daily.    Marland Kitchen ezetimibe (ZETIA) 10 MG tablet Take 1 tablet (10 mg total) by mouth daily. 90 tablet 3  . fluticasone (FLONASE) 50 MCG/ACT nasal spray Place 2 sprays into the nose daily.    Marland Kitchen guaiFENesin (MUCINEX) 600 MG 12 hr tablet Take 600 mg by mouth daily.      Marland Kitchen ibuprofen (ADVIL,MOTRIN) 100 MG tablet Take 100 mg by mouth every 6 (six) hours as needed.      . Insulin Glargine (BASAGLAR KWIKPEN [place]) Inject 16 Units into the skin.     Marland Kitchen liraglutide (VICTOZA) 18 MG/3ML SOPN Inject 1.8 mg into the skin daily.    Marland Kitchen lisinopril (PRINIVIL,ZESTRIL) 2.5 MG tablet Take 2.5 mg by mouth daily.    . metoprolol (TOPROL-XL) 50 MG 24 hr tablet Take 25 mg by mouth daily.     . Multiple Vitamin (MULTIVITAMIN) tablet Take 1 tablet by mouth daily. Reported on 02/24/2016    . oxybutynin (DITROPAN-XL) 10 MG 24 hr tablet Take 10 mg by mouth at bedtime.    Marland Kitchen  rosuvastatin (CRESTOR) 10 MG tablet Take 10 mg by mouth once a  week.     Marland Kitchen tiZANidine (ZANAFLEX) 4 MG tablet Take 4 mg by mouth every 6 (six) hours as needed for muscle spasms (back pain).    . varenicline (CHANTIX STARTING MONTH PAK) 0.5 MG X 11 & 1 MG X 42 tablet Take one 0.5 mg tablet by mouth once daily for 3 days, then increase to one 0.5 mg tablet twice daily for 4 days, then increase to one 1 mg tablet twice daily. 53 tablet 0  . VITAMIN D, CHOLECALCIFEROL, PO Take 2,000 mg by mouth daily.      No current facility-administered medications for this visit.     REVIEW OF SYSTEMS:  [X]  denotes positive finding, [ ]  denotes negative finding Cardiac  Comments:  Chest pain or chest pressure:    Shortness of breath upon exertion: x   Short of breath when lying flat:    Irregular heart rhythm:        Vascular    Pain in calf, thigh, or hip brought on by ambulation: x   Pain in feet at night that wakes you up from your sleep:     Blood clot in your veins:    Leg swelling:         Pulmonary    Oxygen at home:    Productive cough:     Wheezing:         Neurologic    Sudden weakness in arms or legs:     Sudden numbness in arms or legs:     Sudden onset of difficulty speaking or slurred speech:    Temporary loss of vision in one eye:     Problems with dizziness:         Gastrointestinal    Blood in stool:     Vomited blood:         Genitourinary    Burning when urinating:     Blood in urine:        Psychiatric    Major depression:         Hematologic    Bleeding problems:    Problems with blood clotting too easily:        Skin    Rashes or ulcers:        Constitutional    Fever or chills:     PHYSICAL EXAM:   Vitals:   10/24/18 0908 10/24/18 0910  BP: 101/69 101/68  Pulse: 94   Resp: 18   Weight: 163 lb (73.9 kg)   Height: 5\' 1"  (1.549 m)     GENERAL: The patient is a well-nourished female, in no acute distress. The vital signs are documented above. CARDIAC: There is a regular rate and rhythm.  VASCULAR: She has a  right carotid bruit. She has palpable femoral pulses and palpable posterior tibial pulses bilaterally. She has varicose veins in the medial right thigh and some spider veins of telangiectasias bilaterally.  She also has some hyperpigmentation bilaterally. PULMONARY: There is good air exchange bilaterally without wheezing or rales. ABDOMEN: Soft and non-tender with normal pitched bowel sounds.  MUSCULOSKELETAL: There are no major deformities or cyanosis. NEUROLOGIC: No focal weakness or paresthesias are detected. SKIN: There are no ulcers or rashes noted. PSYCHIATRIC: The patient has a normal affect.  DATA:    CAROTID DUPLEX: I did review a carotid duplex scan that was done on 07/18/2018 which showed a less than 39% right carotid stenosis with  a 60 to 79% left carotid stenosis.  ASSESSMENT & PLAN:   ASYMPTOMATIC LEFT CAROTID STENOSIS: This patient has an asymptomatic 60 to 79% left carotid stenosis.  I explained that we would not consider carotid endarterectomy unless the stenosis progressed to greater than 80% or she develop new left hemispheric symptoms.  She would be due for a six-month follow-up study in May and I have ordered that test.  She is on aspirin and is on a statin.  We have also discussed the importance of nutrition and exercise.  I will see her back in May when she has her follow-up carotid duplex scan.  CHRONIC VENOUS INSUFFICIENCY: The patient does have significant varicose veins of the right leg.  She has CEAP C4 venous disease.  Given that she has a congenitally absent inferior vena cava and had a DVT after a laser procedure of the left leg done elsewhere I would be very reluctant to consider an invasive procedure in the right leg.  However currently she is asymptomatic.  If her symptoms progress I explained we can try to get her back in some compression stockings.  Currently she did not want to consider this.   Deitra Mayo [facility] [phone]

## 2018-10-30 ENCOUNTER — Ambulatory Visit (INDEPENDENT_AMBULATORY_CARE_PROVIDER_SITE_OTHER): Payer: Medicare HMO | Admitting: Bariatrics

## 2018-10-31 DIAGNOSIS — I2510 Atherosclerotic heart disease of native coronary artery without angina pectoris: Secondary | ICD-10-CM | POA: Diagnosis not present

## 2018-10-31 DIAGNOSIS — E1165 Type 2 diabetes mellitus with hyperglycemia: Secondary | ICD-10-CM | POA: Diagnosis not present

## 2018-10-31 DIAGNOSIS — E782 Mixed hyperlipidemia: Secondary | ICD-10-CM | POA: Diagnosis not present

## 2018-10-31 DIAGNOSIS — I10 Essential (primary) hypertension: Secondary | ICD-10-CM | POA: Diagnosis not present

## 2018-10-31 DIAGNOSIS — E1159 Type 2 diabetes mellitus with other circulatory complications: Secondary | ICD-10-CM | POA: Diagnosis not present

## 2018-10-31 DIAGNOSIS — M159 Polyosteoarthritis, unspecified: Secondary | ICD-10-CM | POA: Diagnosis not present

## 2018-11-03 DIAGNOSIS — R69 Illness, unspecified: Secondary | ICD-10-CM | POA: Diagnosis not present

## 2018-11-07 DIAGNOSIS — G4733 Obstructive sleep apnea (adult) (pediatric): Secondary | ICD-10-CM | POA: Diagnosis not present

## 2018-11-07 DIAGNOSIS — Z9989 Dependence on other enabling machines and devices: Secondary | ICD-10-CM | POA: Diagnosis not present

## 2018-11-14 ENCOUNTER — Other Ambulatory Visit: Payer: Self-pay

## 2018-11-14 ENCOUNTER — Encounter (INDEPENDENT_AMBULATORY_CARE_PROVIDER_SITE_OTHER): Payer: Self-pay | Admitting: Family Medicine

## 2018-11-14 ENCOUNTER — Ambulatory Visit (INDEPENDENT_AMBULATORY_CARE_PROVIDER_SITE_OTHER): Payer: Medicare HMO | Admitting: Family Medicine

## 2018-11-14 VITALS — BP 92/60 | HR 90 | Temp 97.5°F | Ht 61.0 in | Wt 165.0 lb

## 2018-11-14 DIAGNOSIS — Z6831 Body mass index (BMI) 31.0-31.9, adult: Secondary | ICD-10-CM | POA: Diagnosis not present

## 2018-11-14 DIAGNOSIS — E669 Obesity, unspecified: Secondary | ICD-10-CM

## 2018-11-14 DIAGNOSIS — E7849 Other hyperlipidemia: Secondary | ICD-10-CM

## 2018-11-14 DIAGNOSIS — N183 Chronic kidney disease, stage 3 unspecified: Secondary | ICD-10-CM

## 2018-11-14 DIAGNOSIS — Z794 Long term (current) use of insulin: Secondary | ICD-10-CM

## 2018-11-14 DIAGNOSIS — E1122 Type 2 diabetes mellitus with diabetic chronic kidney disease: Secondary | ICD-10-CM

## 2018-11-14 DIAGNOSIS — F3289 Other specified depressive episodes: Secondary | ICD-10-CM

## 2018-11-14 DIAGNOSIS — R69 Illness, unspecified: Secondary | ICD-10-CM | POA: Diagnosis not present

## 2018-11-14 DIAGNOSIS — F32A Depression, unspecified: Secondary | ICD-10-CM | POA: Insufficient documentation

## 2018-11-14 DIAGNOSIS — F329 Major depressive disorder, single episode, unspecified: Secondary | ICD-10-CM | POA: Insufficient documentation

## 2018-11-14 NOTE — Progress Notes (Signed)
Office: [phone]  /  Fax: [phone]   HPI:   Chief Complaint: OBESITY Sandra [name] is here to discuss her progress with her obesity treatment plan. She is on the Category 1 plan with lunch options and is following her eating plan approximately 70% of the time. She states she is walking 8,000 steps 7 times per week and using weights 15-20 minutes 3 times per week. Sandra [name] has been struggling with sweets cravings and is having 3 Yasso bars per day. Her weight is 165 lb (74.8 kg) today and has had a weight gain of 2 lbs since her last visit. She has lost 15 lbs since starting treatment with [name].  Diabetes Mellitus with CKD 3 Sandra [name] has a diagnosis of diabetes mellitus with CKD3. She is currently taking Basaglar 19 units at bedtime, Victoza, and Jardiance.Sandra [name] states fasting blood sugars range between 100 and 120 and bedtime CBG's range between 135 and 170. She reports craving sweets at night. Last A1c was reported to be 6.7 on 08/17/2018 [facility]). She has been working on intensive lifestyle modifications including diet, exercise, and weight loss to help control her blood glucose levels.  Hyperlipidemia Sandra [name] has hyperlipidemia and has been trying to improve her cholesterol levels with intensive lifestyle modification including a low saturated fat diet, exercise and weight loss. Her LDL was reported to be 120, HDL 41, and triglycerides 188.1 on 03/07/2018. Sandra [name] is currently on Zetia and Crestor. She  denies any chest pain or shortness of breath.   Depression with emotional eating behaviors Sandra [name] is struggling with emotional eating and using food for comfort to the extent that it is negatively impacting her health. She reports cravings at night for sweets. She often snacks when she is not hungry. Sandra [name] sometimes feels she is out of control and then feels guilty that she made poor food choices. She has been working on behavior modification techniques to help reduce her emotional  eating and has been somewhat successful.  She shows no sign of suicidal or homicidal ideations.  Depression screen PHQ 2/9 05/28/2018  Decreased Interest 1  Down, Depressed, Hopeless 1  PHQ - 2 Score 2  Altered sleeping 1  Tired, decreased energy 2  Change in appetite 1  Feeling bad or failure about yourself  1  Trouble concentrating 0  Moving slowly or fidgety/restless 1  Suicidal thoughts 0  PHQ-9 Score 8  Difficult doing work/chores Not difficult at all   ASSESSMENT AND PLAN:  Type 2 diabetes mellitus with stage 3 chronic kidney disease, with long-term current use of insulin ([place]) - Plan: Comprehensive metabolic panel, Hemoglobin A1c, VITAMIN D 25 Hydroxy (Vit-D Deficiency, Fractures)  Other hyperlipidemia - Plan: Lipid Panel With LDL/HDL Ratio  Other depression - with emotional eating  Class 1 obesity with serious comorbidity and body mass index (BMI) of 31.0 to 31.9 in adult, unspecified obesity type  PLAN:  Diabetes Mellitus with CKD 3 Arnelle has been given extensive diabetes education by myself today including ideal fasting and post-prandial blood glucose readings, individual ideal HgA1c goals  and hypoglycemia prevention. We discussed the importance of good blood sugar control to decrease the likelihood of diabetic complications such as nephropathy, neuropathy, limb loss, blindness, coronary artery disease, and death. We discussed the importance of intensive lifestyle modification including diet, exercise and weight loss as the first line treatment for diabetes. Nykole agrees to continue her diabetes medications and will check A1c and fasting glucose today. She agrees to follow-up at the agreed upon time.  Hyperlipidemia Lasheka was informed of the American Heart Association Guidelines emphasizing intensive lifestyle modifications as the first line treatment for hyperlipidemia. We discussed many lifestyle modifications today in depth, and Cleo will continue to work on  decreasing saturated fats such as fatty red meat, butter and many fried foods. She will also increase vegetables and lean protein in her diet and continue to work on exercise and weight loss efforts. Deneka will continue her Zetia and Crestor and will have FLP drawn today. She agrees to follow-up with our clinic in 2-3 weeks.  Depression with Emotional Eating Behaviors We discussed behavior modification techniques today to help Sandra [name] deal with her emotional eating and depression. We discussed with Sandra [name] strategies for emotional eating.  Obesity Sandra [name] is currently in the action stage of change. As such, her goal is to continue with weight loss efforts She has agreed to follow the Category 1 plan with lunch options. She was advised to substitute non-starchy vegetables for Yasso bars during the day. Sandra [name] has been instructed to continue her exercise regimen as noted above. We discussed the following Behavioral Modification Strategies today: decreasing simple carbohydrates, increasing vegetables, and planning for success.  Sandra [name] has agreed to follow-up with our clinic in 2-3 weeks. She was informed of the importance of frequent follow-up visits to maximize her success with intensive lifestyle modifications for her multiple health conditions.  ALLERGIES: Allergies  Allergen Reactions   Erythromycin    Epinephrine Palpitations   Metformin And Related Rash    MEDICATIONS: Current Outpatient Medications on File Prior to Visit  Medication Sig Dispense Refill   amitriptyline (ELAVIL) 25 MG tablet Take one to three (1-3) tablets (25 mg to 75 mg total) by mouth twice daily as needed for back pain.     aspirin EC 81 MG tablet Take 1 tablet (81 mg total) by mouth daily. 90 tablet 3   BIOTIN PO Take by mouth.     CALCIUM PO Take by mouth daily. Reported on 02/24/2016     cetirizine (ZYRTEC) 10 MG tablet Take 10 mg by mouth daily.     Cinnamon 500 MG capsule Take 500 mg by mouth daily.       co-enzyme Q-10 30 MG capsule Take 30 mg by mouth 3 (three) times daily.     diclofenac sodium (VOLTAREN) 1 % GEL Apply topically 4 (four) times daily.     empagliflozin (JARDIANCE) 25 MG TABS tablet Take 25 mg by mouth daily.     ezetimibe (ZETIA) 10 MG tablet Take 1 tablet (10 mg total) by mouth daily. 90 tablet 3   fluticasone (FLONASE) 50 MCG/ACT nasal spray Place 2 sprays into the [name] daily.     guaiFENesin (MUCINEX) 600 MG 12 hr tablet Take 600 mg by mouth daily.       ibuprofen (ADVIL,MOTRIN) 100 MG tablet Take 100 mg by mouth every 6 (six) hours as needed.       Insulin Glargine (BASAGLAR KWIKPEN [place]) Inject 16 Units into the skin.      liraglutide (VICTOZA) 18 MG/3ML SOPN Inject 1.8 mg into the skin daily.     lisinopril (PRINIVIL,ZESTRIL) 2.5 MG tablet Take 2.5 mg by mouth daily.     metoprolol (TOPROL-XL) 50 MG 24 hr tablet Take 25 mg by mouth daily.      Multiple Vitamin (MULTIVITAMIN) tablet Take 1 tablet by mouth daily. Reported on 02/24/2016     oxybutynin (DITROPAN-XL) 10 MG 24 hr tablet Take 10 mg by mouth at bedtime.  rosuvastatin (CRESTOR) 10 MG tablet Take 10 mg by mouth once a week.      tiZANidine (ZANAFLEX) 4 MG tablet Take 4 mg by mouth every 6 (six) hours as needed for muscle spasms (back pain).     varenicline (CHANTIX STARTING MONTH PAK) 0.5 MG X 11 & 1 MG X 42 tablet Take one 0.5 mg tablet by mouth once daily for 3 days, then increase to one 0.5 mg tablet twice daily for 4 days, then increase to one 1 mg tablet twice daily. 53 tablet 0   VITAMIN D, CHOLECALCIFEROL, PO Take 2,000 mg by mouth daily.      No current facility-administered medications on file prior to visit.     PAST MEDICAL HISTORY: Past Medical History:  Diagnosis Date   Arthritis    Back pain    Chronic kidney disease    Congenital absence of left kidney    Congenital absence of left ovary    Coronary artery disease 2004   STATUS POST STENTING OF THE RIGHT CORONARY  OSTIUM    DDD (degenerative disc disease)    Diabetes mellitus without complication (HCC)    Dislocated shoulder    left   DVT (deep venous thrombosis) (HCC)    left posterior tibial vein   High blood pressure    Hypercholesterolemia    Joint pain    Osteoarthritis    Peripheral vascular disease (HCC)    Sleep apnea    Sleep apnea    SOB (shortness of breath)    Tobacco dependence    Vitamin D deficiency     PAST SURGICAL HISTORY: Past Surgical History:  Procedure Laterality Date   ANGIOPLASTY  2004   bone spur shoulder     FOOT SURGERY     plantar fascitis   KNEE SURGERY  1950   TOTAL ABDOMINAL HYSTERECTOMY     TUBAL LIGATION     VEIN BYPASS SURGERY     2014    SOCIAL HISTORY: Social History   Tobacco Use   Smoking status: Former Smoker    Packs/day: 1.00    Last attempt to quit: 10/27/2015    Years since quitting: 3.0   Smokeless tobacco: Never Used  Substance Use Topics   Alcohol use: No    Alcohol/week: 0.0 standard drinks   Drug use: No    FAMILY HISTORY: Family History  Problem Relation Age of Onset   Leukemia Sister    Hypertension Brother    Heart disease Father        before age 73   High blood pressure Father    Sudden death Father    Breast cancer Neg Hx    ROS: Review of Systems  Constitutional: Negative for weight loss.  Respiratory: Negative for shortness of breath.   Cardiovascular: Negative for chest pain.  Psychiatric/Behavioral: Positive for depression (emotional eating).   PHYSICAL EXAM: Blood pressure 92/60, pulse 90, temperature (!) 97.5 F (36.4 C), height 5\' 1"  (1.549 m), weight 165 lb (74.8 kg), SpO2 95 %. Body mass index is 31.18 kg/m. Physical Exam Vitals signs reviewed.  Constitutional:      Appearance: Normal appearance. She is obese.  Cardiovascular:     Rate and Rhythm: Normal rate.     Pulses: Normal pulses.  Pulmonary:     Effort: Pulmonary effort is normal.     Breath sounds:  Normal breath sounds.  Musculoskeletal: Normal range of motion.  Skin:    General: Skin is warm and dry.  Neurological:     Mental Status: She is alert and oriented to person, place, and time.  Psychiatric:        Behavior: Behavior normal.   RECENT LABS AND TESTS: BMET    Component Value Date/Time   NA 143 05/28/2018 1200   K 4.4 05/28/2018 1200   CL 102 05/28/2018 1200   CO2 24 05/28/2018 1200   GLUCOSE 96 05/28/2018 1200   GLUCOSE 115 (H) 10/14/2011 0931   BUN 14 05/28/2018 1200   CREATININE 0.87 05/28/2018 1200   CALCIUM 9.6 05/28/2018 1200   GFRNONAA 66 05/28/2018 1200   GFRAA 76 05/28/2018 1200   No results found for: HGBA1C No results found for: INSULIN CBC No results found for: WBC, RBC, HGB, HCT, PLT, MCV, MCH, MCHC, RDW, LYMPHSABS, MONOABS, EOSABS, BASOSABS Iron/TIBC/Ferritin/ %Sat No results found for: IRON, TIBC, FERRITIN, IRONPCTSAT Lipid Panel     Component Value Date/Time   CHOL 152 10/14/2011 0931   TRIG 108.0 10/14/2011 0931   HDL 43.90 10/14/2011 0931   CHOLHDL 3 10/14/2011 0931   VLDL 21.6 10/14/2011 0931   LDLCALC 87 10/14/2011 0931   Hepatic Function Panel     Component Value Date/Time   PROT 6.9 05/28/2018 1200   ALBUMIN 4.5 05/28/2018 1200   AST 28 05/28/2018 1200   ALT 28 05/28/2018 1200   ALKPHOS 83 05/28/2018 1200   BILITOT 0.6 05/28/2018 1200   BILIDIR 0.0 10/14/2011 0931   No results found for: TSH  Results for Trovato, Eleshia H "JUDY" (MRN [id number]) as of 11/14/2018 11:34  Ref. Range 05/28/2018 12:00  Vitamin D, 25-Hydroxy Latest Ref Range: 30.0 - 100.0 ng/mL 72.9   OBESITY BEHAVIORAL INTERVENTION VISIT  Today's visit was #10  Starting weight: 180 lbs Starting date: 05/28/2018 Today's weight: 165 lbs  Today's date: 11/14/2018 Total lbs lost to date: 15 At least 15 minutes were spent on discussing the following behavioral intervention visit.    11/14/2018  Height 5\' 1"  (1.549 m)  Weight 165 lb (74.8 kg)  BMI (Calculated)  31.19  BLOOD PRESSURE - SYSTOLIC 92  BLOOD PRESSURE - DIASTOLIC 60   Body Fat % 63.8 %  Total Body Water (lbs) 64 lbs   ASK: We discussed the diagnosis of obesity with [name] today and Aziya agreed to give [name] permission to discuss obesity behavioral modification therapy today.  ASSESS: Brendia has the diagnosis of obesity and her BMI today is 31.19. Meleni is in the action stage of change.   ADVISE: Cypress was educated on the multiple health risks of obesity as well as the benefit of weight loss to improve her health. She was advised of the need for long term treatment and the importance of lifestyle modifications to improve her current health and to decrease her risk of future health problems.  AGREE: Multiple dietary modification options and treatment options were discussed and  Erisha agreed to follow the recommendations documented in the above note.  ARRANGE: Miyeko was educated on the importance of frequent visits to treat obesity as outlined per CMS and USPSTF guidelines and agreed to schedule her next follow up appointment today.  IMichaelene Song, am acting as Location manager for [employer], FNP-C.  I have reviewed the above documentation for accuracy and completeness, and I agree with the above.  - Gao Mitnick, FNP-C.

## 2018-11-15 LAB — COMPREHENSIVE METABOLIC PANEL
ALT: 22 IU/L (ref 0–32)
AST: 21 IU/L (ref 0–40)
Albumin/Globulin Ratio: 1.7 (ref 1.2–2.2)
Albumin: 4.3 g/dL (ref 3.7–4.7)
Alkaline Phosphatase: 83 IU/L (ref 39–117)
BUN/Creatinine Ratio: 18 (ref 12–28)
BUN: 20 mg/dL (ref 8–27)
Bilirubin Total: 0.4 mg/dL (ref 0.0–1.2)
CO2: 24 mmol/L (ref 20–29)
Calcium: 9.4 mg/dL (ref 8.7–10.3)
Chloride: 103 mmol/L (ref 96–106)
Creatinine, Ser: 1.09 mg/dL — ABNORMAL HIGH (ref 0.57–1.00)
GFR calc Af Amer: 57 mL/min/{1.73_m2} — ABNORMAL LOW (ref >59)
GFR calc non Af Amer: 50 mL/min/{1.73_m2} — ABNORMAL LOW (ref >59)
Globulin, Total: 2.5 g/dL (ref 1.5–4.5)
Glucose: 122 mg/dL — ABNORMAL HIGH (ref 65–99)
Potassium: 4.8 mmol/L (ref 3.5–5.2)
Sodium: 144 mmol/L (ref 134–144)
Total Protein: 6.8 g/dL (ref 6.0–8.5)

## 2018-11-15 LAB — LIPID PANEL WITH LDL/HDL RATIO
Cholesterol, Total: 148 mg/dL (ref 100–199)
HDL: 45 mg/dL (ref >39)
LDL Calculated: 74 mg/dL (ref 0–99)
LDl/HDL Ratio: 1.6 ratio (ref 0.0–3.2)
Triglycerides: 144 mg/dL (ref 0–149)
VLDL Cholesterol Cal: 29 mg/dL (ref 5–40)

## 2018-11-15 LAB — VITAMIN D 25 HYDROXY (VIT D DEFICIENCY, FRACTURES): Vit D, 25-Hydroxy: 79.4 ng/mL (ref 30.0–100.0)

## 2018-11-15 LAB — HEMOGLOBIN A1C
Est. average glucose Bld gHb Est-mCnc: 151 mg/dL
Hgb A1c MFr Bld: 6.9 % — ABNORMAL HIGH (ref 4.8–5.6)

## 2018-11-20 DIAGNOSIS — L820 Inflamed seborrheic keratosis: Secondary | ICD-10-CM | POA: Diagnosis not present

## 2018-11-20 DIAGNOSIS — L718 Other rosacea: Secondary | ICD-10-CM | POA: Diagnosis not present

## 2018-11-28 ENCOUNTER — Encounter (INDEPENDENT_AMBULATORY_CARE_PROVIDER_SITE_OTHER): Payer: Self-pay

## 2018-12-04 ENCOUNTER — Ambulatory Visit (INDEPENDENT_AMBULATORY_CARE_PROVIDER_SITE_OTHER): Payer: Medicare HMO | Admitting: Bariatrics

## 2018-12-06 ENCOUNTER — Ambulatory Visit (INDEPENDENT_AMBULATORY_CARE_PROVIDER_SITE_OTHER): Payer: Medicare HMO | Admitting: Bariatrics

## 2018-12-06 ENCOUNTER — Other Ambulatory Visit: Payer: Self-pay

## 2018-12-06 ENCOUNTER — Encounter (INDEPENDENT_AMBULATORY_CARE_PROVIDER_SITE_OTHER): Payer: Self-pay | Admitting: Bariatrics

## 2018-12-06 DIAGNOSIS — N183 Chronic kidney disease, stage 3 unspecified: Secondary | ICD-10-CM

## 2018-12-06 DIAGNOSIS — Z6831 Body mass index (BMI) 31.0-31.9, adult: Secondary | ICD-10-CM | POA: Diagnosis not present

## 2018-12-06 DIAGNOSIS — Z794 Long term (current) use of insulin: Secondary | ICD-10-CM

## 2018-12-06 DIAGNOSIS — F3289 Other specified depressive episodes: Secondary | ICD-10-CM

## 2018-12-06 DIAGNOSIS — E669 Obesity, unspecified: Secondary | ICD-10-CM

## 2018-12-06 DIAGNOSIS — R69 Illness, unspecified: Secondary | ICD-10-CM | POA: Diagnosis not present

## 2018-12-06 DIAGNOSIS — E1122 Type 2 diabetes mellitus with diabetic chronic kidney disease: Secondary | ICD-10-CM

## 2018-12-06 DIAGNOSIS — E7849 Other hyperlipidemia: Secondary | ICD-10-CM | POA: Diagnosis not present

## 2018-12-07 DIAGNOSIS — G4733 Obstructive sleep apnea (adult) (pediatric): Secondary | ICD-10-CM | POA: Diagnosis not present

## 2018-12-10 NOTE — Progress Notes (Signed)
Office: [phone]  /  Fax: [phone] TeleHealth Visit:  Sandra [name] has verbally consented to this TeleHealth visit today. The patient is located at home, the provider is located at the [facility] office. The participants in this visit include the listed provider and patient and any and all parties involved. The visit was conducted today via FaceTime.  HPI:   Chief Complaint: OBESITY Sandra [name] is here to discuss her progress with her obesity treatment plan. She is on the Category 1 plan with lunch options and is following her eating plan approximately 70 % of the time. She states she is walking for 30 minutes 6 times per week, riding her stationary bike for 5 minutes 3 times per week and she is weight lifting for 15 minutes 3 times per week. Sandra [name] has lost 3 pounds. She has not been able to eat her bread. Sandra [name] has not had any stress eating and she has minimal hunger. Sandra [name] is weighing her protein. We were unable to weigh the patient today for this TeleHealth visit. She feels as if she has lost weight since her last visit. She has lost 18 lbs since starting treatment with [name].  Diabetes II with chronic kidney disease stage III Sandra [name] has a diagnosis of diabetes type II. She is taking Jardiance and Victoza. Sandra [name] states fasting BGs are approximately 120 and admits to one hypoglycemic episode. Last A1c was 6.9 She has been working on intensive lifestyle modifications including diet, exercise, and weight loss to help control her blood glucose levels.  Hyperlipidemia Sandra [name] has hyperlipidemia and she is taking Crestor once weekly and she is taking Zetia. She has been trying to improve her cholesterol levels with intensive lifestyle modification including a low saturated fat diet, exercise and weight loss. She denies any  myalgias.  Depression with emotional eating behaviors Sandra [name] struggles with emotional eating and using food for comfort to the extent that it is negatively  impacting her health. She often snacks when she is not hungry. Sandra [name] sometimes feels she is out of control and then feels guilty that she made poor food choices. Sandra [name] has not had any stress eating. She has been working on behavior modification techniques to help reduce her emotional eating and has been somewhat successful. She shows no sign of suicidal or homicidal ideations.  Depression screen PHQ 2/9 05/28/2018  Decreased Interest 1  Down, Depressed, Hopeless 1  PHQ - 2 Score 2  Altered sleeping 1  Tired, decreased energy 2  Change in appetite 1  Feeling bad or failure about yourself  1  Trouble concentrating 0  Moving slowly or fidgety/restless 1  Suicidal thoughts 0  PHQ-9 Score 8  Difficult doing work/chores Not difficult at all    ASSESSMENT AND PLAN:  Type 2 diabetes mellitus with stage 3 chronic kidney disease, with long-term current use of insulin (HCC)  Other hyperlipidemia  Other depression - with emotional eating  Class 1 obesity with serious comorbidity and body mass index (BMI) of 31.0 to 31.9 in adult, unspecified obesity type  PLAN:  Diabetes II with chronic kidney disease stage III Sandra [name] has been given extensive diabetes education by myself today including ideal fasting and post-prandial blood glucose readings, individual ideal Hgb A1c goals and hypoglycemia prevention. We discussed the importance of good blood sugar control to decrease the likelihood of diabetic complications such as nephropathy, neuropathy, limb loss, blindness, coronary artery disease, and death. We discussed the importance of intensive lifestyle modification including diet, exercise and  weight loss as the first line treatment for diabetes. She will continue to check her blood sugars. Sandra [name] agrees to continue her diabetes medications and follow up at the agreed upon time.  Hyperlipidemia Sandra [name] was informed of the American Heart Association Guidelines emphasizing intensive lifestyle  modifications as the first line treatment for hyperlipidemia. We discussed many lifestyle modifications today in depth, and Sandra [name] will continue to work on decreasing saturated fats such as fatty red meat, butter and many fried foods. She will also increase vegetables and lean protein in her diet and continue to work on exercise and weight loss efforts. Sandra [name] will continue her medications and follow up as directed.  Depression with Emotional Eating Behaviors We discussed behavior modification techniques today to help Sandra [name] deal with her emotional eating and depression. She will continue CBT strategies for stress and will follow up as directed.  Obesity Sandra [name] is currently in the action stage of change. As such, her goal is to continue with weight loss efforts She has agreed to follow the Category 1 plan with lunch options Sandra [name] has been instructed to work up to a goal of 150 minutes of combined cardio and strengthening exercise per week for weight loss and overall health benefits. We discussed the following Behavioral Modification Strategies today: increase H2O intake (alterate with crystal light), no skipping meals, keeping healthy foods in the home, better snacking choices, increasing lean protein intake, decreasing simple carbohydrates, increasing vegetables, decrease eating out and work on meal planning and easy cooking plans Sandra [name] will weigh herself at home.   Sandra [name] has agreed to follow up with our clinic in 2 weeks. She was informed of the importance of frequent follow up visits to maximize her success with intensive lifestyle modifications for her multiple health conditions.  ALLERGIES: Allergies  Allergen Reactions  . Erythromycin   . Epinephrine Palpitations  . Metformin And Related Rash    MEDICATIONS: Current Outpatient Medications on File Prior to Visit  Medication Sig Dispense Refill  . amitriptyline (ELAVIL) 25 MG tablet Take one to three (1-3) tablets (25 mg to 75 mg  total) by mouth twice daily as needed for back pain.    Marland Kitchen aspirin EC 81 MG tablet Take 1 tablet (81 mg total) by mouth daily. 90 tablet 3  . BIOTIN PO Take by mouth.    Marland Kitchen CALCIUM PO Take by mouth daily. Reported on 02/24/2016    . cetirizine (ZYRTEC) 10 MG tablet Take 10 mg by mouth daily.    . Cinnamon 500 MG capsule Take 500 mg by mouth daily.    Marland Kitchen co-enzyme Q-10 30 MG capsule Take 30 mg by mouth 3 (three) times daily.    . diclofenac sodium (VOLTAREN) 1 % GEL Apply topically 4 (four) times daily.    . empagliflozin (JARDIANCE) 25 MG TABS tablet Take 25 mg by mouth daily.    Marland Kitchen ezetimibe (ZETIA) 10 MG tablet Take 1 tablet (10 mg total) by mouth daily. 90 tablet 3  . fluticasone (FLONASE) 50 MCG/ACT nasal spray Place 2 sprays into the nose daily.    Marland Kitchen guaiFENesin (MUCINEX) 600 MG 12 hr tablet Take 600 mg by mouth daily.      Marland Kitchen ibuprofen (ADVIL,MOTRIN) 100 MG tablet Take 100 mg by mouth every 6 (six) hours as needed.      . Insulin Glargine (BASAGLAR KWIKPEN [place]) Inject 16 Units into the skin.     Marland Kitchen liraglutide (VICTOZA) 18 MG/3ML SOPN Inject 1.8 mg into the skin daily.    Marland Kitchen  lisinopril (PRINIVIL,ZESTRIL) 2.5 MG tablet Take 2.5 mg by mouth daily.    . metoprolol (TOPROL-XL) 50 MG 24 hr tablet Take 25 mg by mouth daily.     . Multiple Vitamin (MULTIVITAMIN) tablet Take 1 tablet by mouth daily. Reported on 02/24/2016    . oxybutynin (DITROPAN-XL) 10 MG 24 hr tablet Take 10 mg by mouth at bedtime.    . rosuvastatin (CRESTOR) 10 MG tablet Take 10 mg by mouth once a week.     Marland Kitchen tiZANidine (ZANAFLEX) 4 MG tablet Take 4 mg by mouth every 6 (six) hours as needed for muscle spasms (back pain).    . varenicline (CHANTIX STARTING MONTH PAK) 0.5 MG X 11 & 1 MG X 42 tablet Take one 0.5 mg tablet by mouth once daily for 3 days, then increase to one 0.5 mg tablet twice daily for 4 days, then increase to one 1 mg tablet twice daily. 53 tablet 0  . VITAMIN D, CHOLECALCIFEROL, PO Take 2,000 mg by mouth daily.       No current facility-administered medications on file prior to visit.     PAST MEDICAL HISTORY: Past Medical History:  Diagnosis Date  . Arthritis   . Back pain   . Chronic kidney disease   . Congenital absence of left kidney   . Congenital absence of left ovary   . Coronary artery disease 2004   STATUS POST STENTING OF THE RIGHT CORONARY OSTIUM   . DDD (degenerative disc disease)   . Diabetes mellitus without complication ([place])   . Dislocated shoulder    left  . DVT (deep venous thrombosis) (HCC)    left posterior tibial vein  . High blood pressure   . Hypercholesterolemia   . Joint pain   . Osteoarthritis   . Peripheral vascular disease ([place])   . Sleep apnea   . Sleep apnea   . SOB (shortness of breath)   . Tobacco dependence   . Vitamin D deficiency     PAST SURGICAL HISTORY: Past Surgical History:  Procedure Laterality Date  . ANGIOPLASTY  2004  . bone spur shoulder    . FOOT SURGERY     plantar fascitis  . KNEE SURGERY  1950  . TOTAL ABDOMINAL HYSTERECTOMY    . TUBAL LIGATION    . VEIN BYPASS SURGERY     2014    SOCIAL HISTORY: Social History   Tobacco Use  . Smoking status: Former Smoker    Packs/day: 1.00    Last attempt to quit: 10/27/2015    Years since quitting: 3.1  . Smokeless tobacco: Never Used  Substance Use Topics  . Alcohol use: No    Alcohol/week: 0.0 standard drinks  . Drug use: No    FAMILY HISTORY: Family History  Problem Relation Age of Onset  . Leukemia Sister   . Hypertension Brother   . Heart disease Father        before age 59  . High blood pressure Father   . Sudden death Father   . Breast cancer Neg Hx     ROS: Review of Systems  Constitutional: Positive for weight loss.  Musculoskeletal: Negative for myalgias.  Endo/Heme/Allergies:       Positive for hypoglycemia  Psychiatric/Behavioral: Positive for depression. Negative for suicidal ideas.    PHYSICAL EXAM: Pt in no acute distress  RECENT LABS AND TESTS:  BMET    Component Value Date/Time   NA 144 11/14/2018 1039   K 4.8 11/14/2018 1039  CL 103 11/14/2018 1039   CO2 24 11/14/2018 1039   GLUCOSE 122 (H) 11/14/2018 1039   GLUCOSE 115 (H) 10/14/2011 0931   BUN 20 11/14/2018 1039   CREATININE 1.09 (H) 11/14/2018 1039   CALCIUM 9.4 11/14/2018 1039   GFRNONAA 50 (L) 11/14/2018 1039   GFRAA 57 (L) 11/14/2018 1039   Lab Results  Component Value Date   HGBA1C 6.9 (H) 11/14/2018   No results found for: INSULIN CBC No results found for: WBC, RBC, HGB, HCT, PLT, MCV, MCH, MCHC, RDW, LYMPHSABS, MONOABS, EOSABS, BASOSABS Iron/TIBC/Ferritin/ %Sat No results found for: IRON, TIBC, FERRITIN, IRONPCTSAT Lipid Panel     Component Value Date/Time   CHOL 148 11/14/2018 1039   TRIG 144 11/14/2018 1039   HDL 45 11/14/2018 1039   CHOLHDL 3 10/14/2011 0931   VLDL 21.6 10/14/2011 0931   LDLCALC 74 11/14/2018 1039   Hepatic Function Panel     Component Value Date/Time   PROT 6.8 11/14/2018 1039   ALBUMIN 4.3 11/14/2018 1039   AST 21 11/14/2018 1039   ALT 22 11/14/2018 1039   ALKPHOS 83 11/14/2018 1039   BILITOT 0.4 11/14/2018 1039   BILIDIR 0.0 10/14/2011 0931   No results found for: TSH    Ref. Range 11/14/2018 10:39  Vitamin D, 25-Hydroxy Latest Ref Range: 30.0 - 100.0 ng/mL 79.4    I, Doreene Nest, am acting as Location manager for [employer]. Owens Shark, DO  I have reviewed the above documentation for accuracy and completeness, and I agree with the above. -[name], DO

## 2018-12-20 ENCOUNTER — Encounter (INDEPENDENT_AMBULATORY_CARE_PROVIDER_SITE_OTHER): Payer: Self-pay | Admitting: Bariatrics

## 2018-12-20 ENCOUNTER — Ambulatory Visit (INDEPENDENT_AMBULATORY_CARE_PROVIDER_SITE_OTHER): Payer: Medicare HMO | Admitting: Bariatrics

## 2018-12-20 ENCOUNTER — Other Ambulatory Visit: Payer: Self-pay

## 2018-12-20 DIAGNOSIS — Z794 Long term (current) use of insulin: Secondary | ICD-10-CM

## 2018-12-20 DIAGNOSIS — E669 Obesity, unspecified: Secondary | ICD-10-CM

## 2018-12-20 DIAGNOSIS — I6522 Occlusion and stenosis of left carotid artery: Secondary | ICD-10-CM | POA: Diagnosis not present

## 2018-12-20 DIAGNOSIS — I10 Essential (primary) hypertension: Secondary | ICD-10-CM | POA: Diagnosis not present

## 2018-12-20 DIAGNOSIS — I25118 Atherosclerotic heart disease of native coronary artery with other forms of angina pectoris: Secondary | ICD-10-CM | POA: Diagnosis not present

## 2018-12-20 DIAGNOSIS — E7800 Pure hypercholesterolemia, unspecified: Secondary | ICD-10-CM | POA: Diagnosis not present

## 2018-12-20 DIAGNOSIS — Z6831 Body mass index (BMI) 31.0-31.9, adult: Secondary | ICD-10-CM | POA: Diagnosis not present

## 2018-12-20 DIAGNOSIS — E119 Type 2 diabetes mellitus without complications: Secondary | ICD-10-CM | POA: Diagnosis not present

## 2018-12-20 DIAGNOSIS — E7849 Other hyperlipidemia: Secondary | ICD-10-CM

## 2018-12-20 DIAGNOSIS — R69 Illness, unspecified: Secondary | ICD-10-CM | POA: Diagnosis not present

## 2018-12-20 LAB — HEPATIC FUNCTION PANEL
ALBUMIN: 4.3 g/dL (ref 3.7–4.7)
ALK PHOS: 74 IU/L (ref 39–117)
ALT: 21 IU/L (ref 0–32)
AST: 20 IU/L (ref 0–40)
BILIRUBIN TOTAL: 0.3 mg/dL (ref 0.0–1.2)
BILIRUBIN, DIRECT: 0.11 mg/dL (ref 0.00–0.40)
Total Protein: 6.7 g/dL (ref 6.0–8.5)

## 2018-12-20 LAB — LIPID PANEL
CHOL/HDL RATIO: 2.9 ratio (ref 0.0–4.4)
Cholesterol, Total: 138 mg/dL (ref 100–199)
HDL: 47 mg/dL (ref >39)
LDL Calculated: 59 mg/dL (ref 0–99)
Triglycerides: 159 mg/dL — ABNORMAL HIGH (ref 0–149)
VLDL Cholesterol Cal: 32 mg/dL (ref 5–40)

## 2018-12-24 NOTE — Progress Notes (Signed)
Office: [phone]  /  Fax: [phone] TeleHealth Visit:  Sandra [name] has verbally consented to this TeleHealth visit today. The patient is located at home, the provider is located at the [employer] [facility] office. The participants in this visit include the listed provider and patient and any and all parties involved. The visit was conducted today via FaceTime.  HPI:   Chief Complaint: OBESITY Sandra [name] is here to discuss her progress with her obesity treatment plan. She is on the Category 1 plan and is following her eating plan approximately 60 to 70 % of the time. She states she is lifting weights and walking for 30 minutes 6 times per week. Sandra [name] states that her weight has stayed the same. She is getting most of the food for the plan. Sandra [name] has done some stress eating. We were unable to weigh the patient today for this TeleHealth visit. She feels as if she has maintained weight since her last visit. She has lost 18 lbs since starting treatment with [name].  Diabetes II Sandra [name] has a diagnosis of diabetes type II. She is currently taking Victoza and Jardiance. Sandra [name] states fasting BGs range between 100 and 140 and 2 hour post prandial BGs range between 84 and 188. She denies any hypoglycemic episodes. Last A1c was at 6.9 She has been working on intensive lifestyle modifications including diet, exercise, and weight loss to help control her blood glucose levels.  Hyperlipidemia Sandra [name] has hyperlipidemia and she is taking Crestor currently. She has been trying to improve her cholesterol levels with intensive lifestyle modification including a low saturated fat diet, exercise and weight loss. She denies myalgias.   ASSESSMENT AND PLAN:  Type 2 diabetes mellitus without complication, with long-term current use of insulin ([place])  Other hyperlipidemia  Class 1 obesity with serious comorbidity and body mass index (BMI) of 31.0 to 31.9 in adult, unspecified obesity  type  PLAN:  Diabetes II Sandra [name] has been given extensive diabetes education by myself today including ideal fasting and post-prandial blood glucose readings, individual ideal Hgb A1c goals and hypoglycemia prevention. We discussed the importance of good blood sugar control to decrease the likelihood of diabetic complications such as nephropathy, neuropathy, limb loss, blindness, coronary artery disease, and death. We discussed the importance of intensive lifestyle modification including diet, exercise and weight loss as the first line treatment for diabetes. Sandra [name] agrees to continue her diabetes medications and will follow up at the agreed upon time.  Hyperlipidemia Sandra [name] was informed of the American Heart Association Guidelines emphasizing intensive lifestyle modifications as the first line treatment for hyperlipidemia. We discussed many lifestyle modifications today in depth, and Sandra [name] will continue to work on decreasing saturated fats such as fatty red meat, butter and many fried foods. She will also increase vegetables and lean protein in her diet and continue to work on exercise and weight loss efforts. Sandra [name] will continue her medications and follow up as directed.  Obesity Sandra [name] is currently in the action stage of change. As such, her goal is to continue with weight loss efforts She has agreed to follow the Category 1 plan with lunch options Sandra [name] will continue exercise (walking), counting steps for weight loss and overall health benefits. We discussed the following Behavioral Modification Strategies today: increase H2O intake, no skipping meals, keeping healthy foods in the home, increasing lean protein intake, decreasing simple carbohydrates, increasing vegetables, decrease eating out and work on meal planning and easy cooking plans Sandra [name] will weigh himself at home before  each visit.  Sandra [name] has agreed to follow up with our clinic in 2 weeks. She was informed of the importance of  frequent follow up visits to maximize her success with intensive lifestyle modifications for her multiple health conditions.  ALLERGIES: Allergies  Allergen Reactions   Erythromycin    Epinephrine Palpitations   Metformin And Related Rash    MEDICATIONS: Current Outpatient Medications on File Prior to Visit  Medication Sig Dispense Refill   amitriptyline (ELAVIL) 25 MG tablet Take one to three (1-3) tablets (25 mg to 75 mg total) by mouth twice daily as needed for back pain.     aspirin EC 81 MG tablet Take 1 tablet (81 mg total) by mouth daily. 90 tablet 3   BIOTIN PO Take by mouth.     CALCIUM PO Take by mouth daily. Reported on 02/24/2016     cetirizine (ZYRTEC) 10 MG tablet Take 10 mg by mouth daily.     Cinnamon 500 MG capsule Take 500 mg by mouth daily.     co-enzyme Q-10 30 MG capsule Take 30 mg by mouth 3 (three) times daily.     diclofenac sodium (VOLTAREN) 1 % GEL Apply topically 4 (four) times daily.     empagliflozin (JARDIANCE) 25 MG TABS tablet Take 25 mg by mouth daily.     ezetimibe (ZETIA) 10 MG tablet Take 1 tablet (10 mg total) by mouth daily. 90 tablet 3   fluticasone (FLONASE) 50 MCG/ACT nasal spray Place 2 sprays into the nose daily.     guaiFENesin (MUCINEX) 600 MG 12 hr tablet Take 600 mg by mouth daily.       ibuprofen (ADVIL,MOTRIN) 100 MG tablet Take 100 mg by mouth every 6 (six) hours as needed.       Insulin Glargine (BASAGLAR KWIKPEN [place]) Inject 16 Units into the skin.      liraglutide (VICTOZA) 18 MG/3ML SOPN Inject 1.8 mg into the skin daily.     lisinopril (PRINIVIL,ZESTRIL) 2.5 MG tablet Take 2.5 mg by mouth daily.     metoprolol (TOPROL-XL) 50 MG 24 hr tablet Take 25 mg by mouth daily.      Multiple Vitamin (MULTIVITAMIN) tablet Take 1 tablet by mouth daily. Reported on 02/24/2016     oxybutynin (DITROPAN-XL) 10 MG 24 hr tablet Take 10 mg by mouth at bedtime.     rosuvastatin (CRESTOR) 10 MG tablet Take 10 mg by mouth once a week.       tiZANidine (ZANAFLEX) 4 MG tablet Take 4 mg by mouth every 6 (six) hours as needed for muscle spasms (back pain).     varenicline (CHANTIX STARTING MONTH PAK) 0.5 MG X 11 & 1 MG X 42 tablet Take one 0.5 mg tablet by mouth once daily for 3 days, then increase to one 0.5 mg tablet twice daily for 4 days, then increase to one 1 mg tablet twice daily. 53 tablet 0   VITAMIN D, CHOLECALCIFEROL, PO Take 2,000 mg by mouth daily.      No current facility-administered medications on file prior to visit.     PAST MEDICAL HISTORY: Past Medical History:  Diagnosis Date   Arthritis    Back pain    Chronic kidney disease    Congenital absence of left kidney    Congenital absence of left ovary    Coronary artery disease 2004   STATUS POST STENTING OF THE RIGHT CORONARY OSTIUM    DDD (degenerative disc disease)    Diabetes mellitus without  complication ([place])    Dislocated shoulder    left   DVT (deep venous thrombosis) (HCC)    left posterior tibial vein   High blood pressure    Hypercholesterolemia    Joint pain    Osteoarthritis    Peripheral vascular disease (HCC)    Sleep apnea    Sleep apnea    SOB (shortness of breath)    Tobacco dependence    Vitamin D deficiency     PAST SURGICAL HISTORY: Past Surgical History:  Procedure Laterality Date   ANGIOPLASTY  2004   bone spur shoulder     FOOT SURGERY     plantar fascitis   KNEE SURGERY  1950   TOTAL ABDOMINAL HYSTERECTOMY     TUBAL LIGATION     VEIN BYPASS SURGERY     2014    SOCIAL HISTORY: Social History   Tobacco Use   Smoking status: Former Smoker    Packs/day: 1.00    Last attempt to quit: 10/27/2015    Years since quitting: 3.1   Smokeless tobacco: Never Used  Substance Use Topics   Alcohol use: No    Alcohol/week: 0.0 standard drinks   Drug use: No    FAMILY HISTORY: Family History  Problem Relation Age of Onset   Leukemia Sister    Hypertension Brother    Heart  disease Father        before age 61   High blood pressure Father    Sudden death Father    Breast cancer Neg Hx     ROS: Review of Systems  Constitutional: Negative for weight loss.  Musculoskeletal: Negative for myalgias.  Endo/Heme/Allergies:       Negative for hypoglycemia    PHYSICAL EXAM: Pt in no acute distress  RECENT LABS AND TESTS: BMET    Component Value Date/Time   NA 144 11/14/2018 1039   K 4.8 11/14/2018 1039   CL 103 11/14/2018 1039   CO2 24 11/14/2018 1039   GLUCOSE 122 (H) 11/14/2018 1039   GLUCOSE 115 (H) 10/14/2011 0931   BUN 20 11/14/2018 1039   CREATININE 1.09 (H) 11/14/2018 1039   CALCIUM 9.4 11/14/2018 1039   GFRNONAA 50 (L) 11/14/2018 1039   GFRAA 57 (L) 11/14/2018 1039   Lab Results  Component Value Date   HGBA1C 6.9 (H) 11/14/2018   No results found for: INSULIN CBC No results found for: WBC, RBC, HGB, HCT, PLT, MCV, MCH, MCHC, RDW, LYMPHSABS, MONOABS, EOSABS, BASOSABS Iron/TIBC/Ferritin/ %Sat No results found for: IRON, TIBC, FERRITIN, IRONPCTSAT Lipid Panel     Component Value Date/Time   CHOL 138 12/20/2018 0850   TRIG 159 (H) 12/20/2018 0850   HDL 47 12/20/2018 0850   CHOLHDL 2.9 12/20/2018 0850   CHOLHDL 3 10/14/2011 0931   VLDL 21.6 10/14/2011 0931   LDLCALC 59 12/20/2018 0850   Hepatic Function Panel     Component Value Date/Time   PROT 6.7 12/20/2018 0850   ALBUMIN 4.3 12/20/2018 0850   AST 20 12/20/2018 0850   ALT 21 12/20/2018 0850   ALKPHOS 74 12/20/2018 0850   BILITOT 0.3 12/20/2018 0850   BILIDIR 0.11 12/20/2018 0850   No results found for: TSH    Ref. Range 11/14/2018 10:39  Vitamin D, 25-Hydroxy Latest Ref Range: 30.0 - 100.0 ng/mL 79.4    I, Doreene Nest, am acting as Location manager for [employer]. Owens Shark, DO  I have reviewed the above documentation for accuracy and completeness, and I agree with the  above. [name], DO

## 2019-01-03 ENCOUNTER — Other Ambulatory Visit: Payer: Self-pay

## 2019-01-03 ENCOUNTER — Ambulatory Visit (INDEPENDENT_AMBULATORY_CARE_PROVIDER_SITE_OTHER): Payer: Medicare HMO | Admitting: Bariatrics

## 2019-01-03 ENCOUNTER — Encounter (INDEPENDENT_AMBULATORY_CARE_PROVIDER_SITE_OTHER): Payer: Self-pay | Admitting: Bariatrics

## 2019-01-03 DIAGNOSIS — E119 Type 2 diabetes mellitus without complications: Secondary | ICD-10-CM | POA: Diagnosis not present

## 2019-01-03 DIAGNOSIS — E7849 Other hyperlipidemia: Secondary | ICD-10-CM

## 2019-01-03 DIAGNOSIS — Z6831 Body mass index (BMI) 31.0-31.9, adult: Secondary | ICD-10-CM | POA: Diagnosis not present

## 2019-01-03 DIAGNOSIS — E669 Obesity, unspecified: Secondary | ICD-10-CM | POA: Diagnosis not present

## 2019-01-07 NOTE — Progress Notes (Signed)
Office: [phone]  /  Fax: [phone] TeleHealth Visit:  [name] has verbally consented to this TeleHealth visit today. The patient is located at home, the provider is located at the [facility] office. The participants in this visit include the listed provider and patient and any and all parties involved. The visit was conducted today via FaceTime.  HPI:   Chief Complaint: OBESITY Sandra [name] is here to discuss her progress with her obesity treatment plan. She is on the Category 1 plan and is following her eating plan approximately 60 to 70 % of the time. She states she is walking 6,000 steps 6 times per week. Hong states that she is the same weight (165 lbs). She is finding more items on the plan. She is doing well with protein and water. We were unable to weigh the patient today for this TeleHealth visit. She feels as if she has maintained weight since her last visit. She has lost 15 lbs since starting treatment with [name].  Diabetes II Sandra [name] has a diagnosis of diabetes type II. She is taking Jardiance and Victoza. Sandra [name] states fasting blood sugar today is 138, which is typical of reading and she denies any lows. Last A1c was at 6.9 She has been working on intensive lifestyle modifications including diet, exercise, and weight loss to help control her blood glucose levels.  Hyperlipidemia Sandra [name] has hyperlipidemia and she is taking Zetia and Crestor. Sandra [name] has been trying to improve her cholesterol levels with intensive lifestyle modification including a low saturated fat diet, exercise and weight loss. She denies myalgias.  ASSESSMENT AND PLAN:  Type 2 diabetes mellitus without complication, without long-term current use of insulin ([place])  Other hyperlipidemia  Class 1 obesity with serious comorbidity and body mass index (BMI) of 31.0 to 31.9 in adult, unspecified obesity type  PLAN:  Diabetes II Sandra [name] has been given extensive diabetes education by myself  today including ideal fasting and post-prandial blood glucose readings, individual ideal Hgb A1c goals  and hypoglycemia prevention. We discussed the importance of good blood sugar control to decrease the likelihood of diabetic complications such as nephropathy, neuropathy, limb loss, blindness, coronary artery disease, and death. We discussed the importance of intensive lifestyle modification including diet, exercise and weight loss as the first line treatment for diabetes. Sandra [name] agrees to continue her diabetes medications and will follow up at the agreed upon time.  Hyperlipidemia Sandra [name] was informed of the American Heart Association Guidelines emphasizing intensive lifestyle modifications as the first line treatment for hyperlipidemia. We discussed many lifestyle modifications today in depth, and Sandra [name] will continue to work on decreasing saturated fats such as fatty red meat, butter and many fried foods. She will also increase vegetables and lean protein in her diet and continue to work on exercise and weight loss efforts. Sandra [name] will continue her medications and follow up as directed.  Obesity Sandra [name] is currently in the action stage of change. As such, her goal is to continue with weight loss efforts She has agreed to follow the Category 1 plan Sandra [name] will continue her exercise regimen for weight loss and overall health benefits. We discussed the following Behavioral Modification Strategies today: increase H2O intake, no skipping meals, keeping healthy foods in the home, increasing lean protein intake, decreasing simple carbohydrates, increasing vegetables, decrease eating out and work on meal planning and intentional eating Julianny will weigh herself at home before each visit.  Sandra [name] has agreed to follow up with our clinic in 2 weeks.  She was informed of the importance of frequent follow up visits to maximize her success with intensive lifestyle modifications for her multiple health  conditions.  ALLERGIES: Allergies  Allergen Reactions   Erythromycin    Epinephrine Palpitations   Metformin And Related Rash    MEDICATIONS: Current Outpatient Medications on File Prior to Visit  Medication Sig Dispense Refill   amitriptyline (ELAVIL) 25 MG tablet Take one to three (1-3) tablets (25 mg to 75 mg total) by mouth twice daily as needed for back pain.     aspirin EC 81 MG tablet Take 1 tablet (81 mg total) by mouth daily. 90 tablet 3   BIOTIN PO Take by mouth.     CALCIUM PO Take by mouth daily. Reported on 02/24/2016     cetirizine (ZYRTEC) 10 MG tablet Take 10 mg by mouth daily.     Cinnamon 500 MG capsule Take 500 mg by mouth daily.     co-enzyme Q-10 30 MG capsule Take 30 mg by mouth 3 (three) times daily.     diclofenac sodium (VOLTAREN) 1 % GEL Apply topically 4 (four) times daily.     empagliflozin (JARDIANCE) 25 MG TABS tablet Take 25 mg by mouth daily.     ezetimibe (ZETIA) 10 MG tablet Take 1 tablet (10 mg total) by mouth daily. 90 tablet 3   fluticasone (FLONASE) 50 MCG/ACT nasal spray Place 2 sprays into the nose daily.     guaiFENesin (MUCINEX) 600 MG 12 hr tablet Take 600 mg by mouth daily.       ibuprofen (ADVIL,MOTRIN) 100 MG tablet Take 100 mg by mouth every 6 (six) hours as needed.       Insulin Glargine (BASAGLAR KWIKPEN [place]) Inject 16 Units into the skin.      liraglutide (VICTOZA) 18 MG/3ML SOPN Inject 1.8 mg into the skin daily.     lisinopril (PRINIVIL,ZESTRIL) 2.5 MG tablet Take 2.5 mg by mouth daily.     metoprolol (TOPROL-XL) 50 MG 24 hr tablet Take 25 mg by mouth daily.      Multiple Vitamin (MULTIVITAMIN) tablet Take 1 tablet by mouth daily. Reported on 02/24/2016     oxybutynin (DITROPAN-XL) 10 MG 24 hr tablet Take 10 mg by mouth at bedtime.     rosuvastatin (CRESTOR) 10 MG tablet Take 10 mg by mouth once a week.      tiZANidine (ZANAFLEX) 4 MG tablet Take 4 mg by mouth every 6 (six) hours as needed for muscle spasms  (back pain).     varenicline (CHANTIX STARTING MONTH PAK) 0.5 MG X 11 & 1 MG X 42 tablet Take one 0.5 mg tablet by mouth once daily for 3 days, then increase to one 0.5 mg tablet twice daily for 4 days, then increase to one 1 mg tablet twice daily. 53 tablet 0   VITAMIN D, CHOLECALCIFEROL, PO Take 2,000 mg by mouth daily.      No current facility-administered medications on file prior to visit.     PAST MEDICAL HISTORY: Past Medical History:  Diagnosis Date   Arthritis    Back pain    Chronic kidney disease    Congenital absence of left kidney    Congenital absence of left ovary    Coronary artery disease 2004   STATUS POST STENTING OF THE RIGHT CORONARY OSTIUM    DDD (degenerative disc disease)    Diabetes mellitus without complication (HCC)    Dislocated shoulder    left   DVT (deep  venous thrombosis) (HCC)    left posterior tibial vein   High blood pressure    Hypercholesterolemia    Joint pain    Osteoarthritis    Peripheral vascular disease (HCC)    Sleep apnea    Sleep apnea    SOB (shortness of breath)    Tobacco dependence    Vitamin D deficiency     PAST SURGICAL HISTORY: Past Surgical History:  Procedure Laterality Date   ANGIOPLASTY  2004   bone spur shoulder     FOOT SURGERY     plantar fascitis   KNEE SURGERY  1950   TOTAL ABDOMINAL HYSTERECTOMY     TUBAL LIGATION     VEIN BYPASS SURGERY     2014    SOCIAL HISTORY: Social History   Tobacco Use   Smoking status: Former Smoker    Packs/day: 1.00    Last attempt to quit: 10/27/2015    Years since quitting: 3.2   Smokeless tobacco: Never Used  Substance Use Topics   Alcohol use: No    Alcohol/week: 0.0 standard drinks   Drug use: No    FAMILY HISTORY: Family History  Problem Relation Age of Onset   Leukemia Sister    Hypertension Brother    Heart disease Father        before age 83   High blood pressure Father    Sudden death Father    Breast  cancer Neg Hx     ROS: Review of Systems  Constitutional: Negative for weight loss.  Musculoskeletal: Negative for myalgias.  Endo/Heme/Allergies:       Negative for hypoglycemia    PHYSICAL EXAM: Pt in no acute distress  RECENT LABS AND TESTS: BMET    Component Value Date/Time   NA 144 11/14/2018 1039   K 4.8 11/14/2018 1039   CL 103 11/14/2018 1039   CO2 24 11/14/2018 1039   GLUCOSE 122 (H) 11/14/2018 1039   GLUCOSE 115 (H) 10/14/2011 0931   BUN 20 11/14/2018 1039   CREATININE 1.09 (H) 11/14/2018 1039   CALCIUM 9.4 11/14/2018 1039   GFRNONAA 50 (L) 11/14/2018 1039   GFRAA 57 (L) 11/14/2018 1039   Lab Results  Component Value Date   HGBA1C 6.9 (H) 11/14/2018   No results found for: INSULIN CBC No results found for: WBC, RBC, HGB, HCT, PLT, MCV, MCH, MCHC, RDW, LYMPHSABS, MONOABS, EOSABS, BASOSABS Iron/TIBC/Ferritin/ %Sat No results found for: IRON, TIBC, FERRITIN, IRONPCTSAT Lipid Panel     Component Value Date/Time   CHOL 138 12/20/2018 0850   TRIG 159 (H) 12/20/2018 0850   HDL 47 12/20/2018 0850   CHOLHDL 2.9 12/20/2018 0850   CHOLHDL 3 10/14/2011 0931   VLDL 21.6 10/14/2011 0931   LDLCALC 59 12/20/2018 0850   Hepatic Function Panel     Component Value Date/Time   PROT 6.7 12/20/2018 0850   ALBUMIN 4.3 12/20/2018 0850   AST 20 12/20/2018 0850   ALT 21 12/20/2018 0850   ALKPHOS 74 12/20/2018 0850   BILITOT 0.3 12/20/2018 0850   BILIDIR 0.11 12/20/2018 0850   No results found for: TSH    Ref. Range 11/14/2018 10:39  Vitamin D, 25-Hydroxy Latest Ref Range: 30.0 - 100.0 ng/mL 79.4   I, Doreene Nest, am acting as Location manager for [employer]. Owens Shark, DO  I have reviewed the above documentation for accuracy and completeness, and I agree with the above. -[name], DO

## 2019-01-15 DIAGNOSIS — I10 Essential (primary) hypertension: Secondary | ICD-10-CM | POA: Diagnosis not present

## 2019-01-15 DIAGNOSIS — I8393 Asymptomatic varicose veins of bilateral lower extremities: Secondary | ICD-10-CM | POA: Diagnosis not present

## 2019-01-15 DIAGNOSIS — R69 Illness, unspecified: Secondary | ICD-10-CM | POA: Diagnosis not present

## 2019-01-15 DIAGNOSIS — I6529 Occlusion and stenosis of unspecified carotid artery: Secondary | ICD-10-CM | POA: Diagnosis not present

## 2019-01-15 DIAGNOSIS — E782 Mixed hyperlipidemia: Secondary | ICD-10-CM | POA: Diagnosis not present

## 2019-01-15 DIAGNOSIS — I2510 Atherosclerotic heart disease of native coronary artery without angina pectoris: Secondary | ICD-10-CM | POA: Diagnosis not present

## 2019-01-15 DIAGNOSIS — E1159 Type 2 diabetes mellitus with other circulatory complications: Secondary | ICD-10-CM | POA: Diagnosis not present

## 2019-01-15 DIAGNOSIS — G4733 Obstructive sleep apnea (adult) (pediatric): Secondary | ICD-10-CM | POA: Diagnosis not present

## 2019-01-15 DIAGNOSIS — R32 Unspecified urinary incontinence: Secondary | ICD-10-CM | POA: Diagnosis not present

## 2019-01-17 ENCOUNTER — Other Ambulatory Visit: Payer: Self-pay

## 2019-01-17 ENCOUNTER — Ambulatory Visit (INDEPENDENT_AMBULATORY_CARE_PROVIDER_SITE_OTHER): Payer: Medicare HMO | Admitting: Bariatrics

## 2019-01-17 DIAGNOSIS — E669 Obesity, unspecified: Secondary | ICD-10-CM

## 2019-01-17 DIAGNOSIS — Z6831 Body mass index (BMI) 31.0-31.9, adult: Secondary | ICD-10-CM

## 2019-01-17 DIAGNOSIS — E119 Type 2 diabetes mellitus without complications: Secondary | ICD-10-CM | POA: Diagnosis not present

## 2019-01-17 DIAGNOSIS — Z794 Long term (current) use of insulin: Secondary | ICD-10-CM

## 2019-01-17 DIAGNOSIS — I10 Essential (primary) hypertension: Secondary | ICD-10-CM | POA: Diagnosis not present

## 2019-01-21 ENCOUNTER — Encounter (INDEPENDENT_AMBULATORY_CARE_PROVIDER_SITE_OTHER): Payer: Self-pay | Admitting: Bariatrics

## 2019-01-21 NOTE — Progress Notes (Signed)
Office: [phone]  /  Fax: [phone] TeleHealth Visit:  Sandra [name] has verbally consented to this TeleHealth visit today. The patient is located at home, the provider is located at the [facility] office. The participants in this visit include the listed provider and patient and any and all parties involved. The visit was conducted today via FaceTime.  HPI:   Chief Complaint: OBESITY Sandra [name] is here to discuss her progress with her obesity treatment plan. She is on the Category 1 plan and is following her eating plan approximately 60 % of the time. She states she is exercising 0 minutes 0 times per week. Sandra [name] remains the same weight (weight 162 lbs). She is drinking more water. We were unable to weigh the patient today for this TeleHealth visit. She feels as if she has maintained weight since her last visit. She has lost 18 lbs since starting treatment with Sandra [name].  Diabetes II Sandra [name] has a diagnosis of diabetes type II. She is taking Victoza and Jardiance. Sandra [name] states fasting BGs range in the 130's and she denies any hypoglycemic episodes. Last A1c was at 6.9 She has been working on intensive lifestyle modifications including diet, exercise, and weight loss to help control her blood glucose levels.  Hypertension AGAM TUOHY is a 73 y.o. female with hypertension. She is taking Zestril and Toprol-XL. MARNEY TRELOAR denies chest pain or shortness of breath on exertion. She is working weight loss to help control her blood pressure with the goal of decreasing her risk of heart attack and stroke. Judiths blood pressure is currently controlled.  ASSESSMENT AND PLAN:  Type 2 diabetes mellitus without complication, with long-term current use of insulin (HCC)  Essential hypertension  Class 1 obesity with serious comorbidity and body mass index (BMI) of 31.0 to 31.9 in adult, unspecified obesity type  PLAN:  Diabetes II Sandra [name] has been given extensive diabetes  education by myself today including ideal fasting and post-prandial blood glucose readings, individual ideal Hgb A1c goals and hypoglycemia prevention. We discussed the importance of good blood sugar control to decrease the likelihood of diabetic complications such as nephropathy, neuropathy, limb loss, blindness, coronary artery disease, and death. We discussed the importance of intensive lifestyle modification including diet, exercise and weight loss as the first line treatment for diabetes. Sandra [name] agrees to continue her diabetes medications and will follow up at the agreed upon time.  Hypertension We discussed sodium restriction, working on healthy weight loss, and a regular exercise program as the means to achieve improved blood pressure control. Sandra [name] agreed with this plan and agreed to follow up as directed. We will continue to monitor her blood pressure as well as her progress with the above lifestyle modifications. She will continue her medications as prescribed and will watch for signs of hypotension as she continues her lifestyle modifications.  Obesity Sandra [name] is currently in the action stage of change. As such, her goal is to continue with weight loss efforts She has agreed to follow the Category 1 plan Sandra [name] will continue to increase steps for weight loss and overall health benefits. We discussed the following Behavioral Modification Strategies today: increase H2O intake, no skipping meals, keeping healthy foods in the home, increasing lean protein intake, decreasing simple carbohydrates, increasing vegetables, decrease eating out, work on meal planning and easy cooking plans and ways to avoid boredom eating Dawson will weigh herself at home before each visit.  Sandra [name] has agreed to follow up with our clinic in 2  weeks. She was informed of the importance of frequent follow up visits to maximize her success with intensive lifestyle modifications for her multiple health  conditions.  ALLERGIES: Allergies  Allergen Reactions   Erythromycin    Epinephrine Palpitations   Metformin And Related Rash    MEDICATIONS: Current Outpatient Medications on File Prior to Visit  Medication Sig Dispense Refill   amitriptyline (ELAVIL) 25 MG tablet Take one to three (1-3) tablets (25 mg to 75 mg total) by mouth twice daily as needed for back pain.     aspirin EC 81 MG tablet Take 1 tablet (81 mg total) by mouth daily. 90 tablet 3   BIOTIN PO Take by mouth.     CALCIUM PO Take by mouth daily. Reported on 02/24/2016     cetirizine (ZYRTEC) 10 MG tablet Take 10 mg by mouth daily.     Cinnamon 500 MG capsule Take 500 mg by mouth daily.     co-enzyme Q-10 30 MG capsule Take 30 mg by mouth 3 (three) times daily.     diclofenac sodium (VOLTAREN) 1 % GEL Apply topically 4 (four) times daily.     empagliflozin (JARDIANCE) 25 MG TABS tablet Take 25 mg by mouth daily.     ezetimibe (ZETIA) 10 MG tablet Take 1 tablet (10 mg total) by mouth daily. 90 tablet 3   fluticasone (FLONASE) 50 MCG/ACT nasal spray Place 2 sprays into the nose daily.     guaiFENesin (MUCINEX) 600 MG 12 hr tablet Take 600 mg by mouth daily.       ibuprofen (ADVIL,MOTRIN) 100 MG tablet Take 100 mg by mouth every 6 (six) hours as needed.       Insulin Glargine (BASAGLAR KWIKPEN [place]) Inject 16 Units into the skin.      liraglutide (VICTOZA) 18 MG/3ML SOPN Inject 1.8 mg into the skin daily.     lisinopril (PRINIVIL,ZESTRIL) 2.5 MG tablet Take 2.5 mg by mouth daily.     metoprolol (TOPROL-XL) 50 MG 24 hr tablet Take 25 mg by mouth daily.      Multiple Vitamin (MULTIVITAMIN) tablet Take 1 tablet by mouth daily. Reported on 02/24/2016     oxybutynin (DITROPAN-XL) 10 MG 24 hr tablet Take 10 mg by mouth at bedtime.     rosuvastatin (CRESTOR) 10 MG tablet Take 10 mg by mouth once a week.      tiZANidine (ZANAFLEX) 4 MG tablet Take 4 mg by mouth every 6 (six) hours as needed for muscle spasms  (back pain).     varenicline (CHANTIX STARTING MONTH PAK) 0.5 MG X 11 & 1 MG X 42 tablet Take one 0.5 mg tablet by mouth once daily for 3 days, then increase to one 0.5 mg tablet twice daily for 4 days, then increase to one 1 mg tablet twice daily. 53 tablet 0   VITAMIN D, CHOLECALCIFEROL, PO Take 2,000 mg by mouth daily.      No current facility-administered medications on file prior to visit.     PAST MEDICAL HISTORY: Past Medical History:  Diagnosis Date   Arthritis    Back pain    Chronic kidney disease    Congenital absence of left kidney    Congenital absence of left ovary    Coronary artery disease 2004   STATUS POST STENTING OF THE RIGHT CORONARY OSTIUM    DDD (degenerative disc disease)    Diabetes mellitus without complication (HCC)    Dislocated shoulder    left   DVT (  deep venous thrombosis) (HCC)    left posterior tibial vein   High blood pressure    Hypercholesterolemia    Joint pain    Osteoarthritis    Peripheral vascular disease (HCC)    Sleep apnea    Sleep apnea    SOB (shortness of breath)    Tobacco dependence    Vitamin D deficiency     PAST SURGICAL HISTORY: Past Surgical History:  Procedure Laterality Date   ANGIOPLASTY  2004   bone spur shoulder     FOOT SURGERY     plantar fascitis   KNEE SURGERY  1950   TOTAL ABDOMINAL HYSTERECTOMY     TUBAL LIGATION     VEIN BYPASS SURGERY     2014    SOCIAL HISTORY: Social History   Tobacco Use   Smoking status: Former Smoker    Packs/day: 1.00    Last attempt to quit: 10/27/2015    Years since quitting: 3.2   Smokeless tobacco: Never Used  Substance Use Topics   Alcohol use: No    Alcohol/week: 0.0 standard drinks   Drug use: No    FAMILY HISTORY: Family History  Problem Relation Age of Onset   Leukemia Sister    Hypertension Brother    Heart disease Father        before age 18   High blood pressure Father    Sudden death Father    Breast  cancer Neg Hx     ROS: Review of Systems  Constitutional: Negative for weight loss.  Respiratory: Negative for shortness of breath (on exertion).   Cardiovascular: Negative for chest pain.  Endo/Heme/Allergies:       Negative for hypoglycemia    PHYSICAL EXAM: Pt in no acute distress  RECENT LABS AND TESTS: BMET    Component Value Date/Time   NA 144 11/14/2018 1039   K 4.8 11/14/2018 1039   CL 103 11/14/2018 1039   CO2 24 11/14/2018 1039   GLUCOSE 122 (H) 11/14/2018 1039   GLUCOSE 115 (H) 10/14/2011 0931   BUN 20 11/14/2018 1039   CREATININE 1.09 (H) 11/14/2018 1039   CALCIUM 9.4 11/14/2018 1039   GFRNONAA 50 (L) 11/14/2018 1039   GFRAA 57 (L) 11/14/2018 1039   Lab Results  Component Value Date   HGBA1C 6.9 (H) 11/14/2018   No results found for: INSULIN CBC No results found for: WBC, RBC, HGB, HCT, PLT, MCV, MCH, MCHC, RDW, LYMPHSABS, MONOABS, EOSABS, BASOSABS Iron/TIBC/Ferritin/ %Sat No results found for: IRON, TIBC, FERRITIN, IRONPCTSAT Lipid Panel     Component Value Date/Time   CHOL 138 12/20/2018 0850   TRIG 159 (H) 12/20/2018 0850   HDL 47 12/20/2018 0850   CHOLHDL 2.9 12/20/2018 0850   CHOLHDL 3 10/14/2011 0931   VLDL 21.6 10/14/2011 0931   LDLCALC 59 12/20/2018 0850   Hepatic Function Panel     Component Value Date/Time   PROT 6.7 12/20/2018 0850   ALBUMIN 4.3 12/20/2018 0850   AST 20 12/20/2018 0850   ALT 21 12/20/2018 0850   ALKPHOS 74 12/20/2018 0850   BILITOT 0.3 12/20/2018 0850   BILIDIR 0.11 12/20/2018 0850   No results found for: TSH   Ref. Range 11/14/2018 10:39  Vitamin D, 25-Hydroxy Latest Ref Range: 30.0 - 100.0 ng/mL 79.4    I, Doreene Nest, am acting as Location manager for [employer]. Owens Shark, DO  I have reviewed the above documentation for accuracy and completeness, and I agree with the above. -[name], DO

## 2019-01-23 ENCOUNTER — Ambulatory Visit: Payer: Medicare HMO | Admitting: Vascular Surgery

## 2019-01-23 ENCOUNTER — Encounter (HOSPITAL_COMMUNITY): Payer: Medicare HMO

## 2019-01-31 ENCOUNTER — Encounter (INDEPENDENT_AMBULATORY_CARE_PROVIDER_SITE_OTHER): Payer: Self-pay | Admitting: Bariatrics

## 2019-01-31 ENCOUNTER — Ambulatory Visit (INDEPENDENT_AMBULATORY_CARE_PROVIDER_SITE_OTHER): Payer: Medicare HMO | Admitting: Bariatrics

## 2019-01-31 ENCOUNTER — Other Ambulatory Visit: Payer: Self-pay

## 2019-01-31 DIAGNOSIS — I10 Essential (primary) hypertension: Secondary | ICD-10-CM | POA: Diagnosis not present

## 2019-01-31 DIAGNOSIS — E669 Obesity, unspecified: Secondary | ICD-10-CM | POA: Diagnosis not present

## 2019-01-31 DIAGNOSIS — E119 Type 2 diabetes mellitus without complications: Secondary | ICD-10-CM | POA: Diagnosis not present

## 2019-01-31 DIAGNOSIS — Z794 Long term (current) use of insulin: Secondary | ICD-10-CM | POA: Diagnosis not present

## 2019-01-31 DIAGNOSIS — Z6831 Body mass index (BMI) 31.0-31.9, adult: Secondary | ICD-10-CM | POA: Diagnosis not present

## 2019-01-31 NOTE — Progress Notes (Signed)
Office: [phone]  /  Fax: [phone] TeleHealth Visit:  Sandra [name] has verbally consented to this TeleHealth visit today. The patient is located at home, the provider is located at the [facility] office. The participants in this visit include the listed provider and patient and any and all parties involved. The visit was conducted today via FaceTime.  HPI:   Chief Complaint: OBESITY Sandra [name] is here to discuss her progress with her obesity treatment plan. She is on the Category 1 plan and is following her eating plan approximately 60 to 70 % of the time. She states she is walking 20 minutes 4 to 5 times per week. Sandra [name] states that she has gained 2 pounds. She has done well overall. Sandra [name] enjoyed her holiday, but she did not exercise as much. She is doing well with water. We were unable to weigh the patient today for this TeleHealth visit. She feels as if she has gained weight since her last visit. She has lost 16 lbs since starting treatment with [name].  Diabetes II Sandra [name] has a diagnosis of diabetes type II. She is taking Jardiance and Victoza. Sandra [name] states fasting blood sugar was140 today and she denies any hypoglycemic episodes. Last A1c was at 6.9 She has been working on intensive lifestyle modifications including diet, exercise, and weight loss to help control her blood glucose levels.  Hypertension Sandra [name] is a 73 y.o. female with hypertension.  Sandra [name] denies chest pain or shortness of breath on exertion. She is working weight loss to help control her blood pressure with the goal of decreasing her risk of heart attack and stroke. Judiths blood pressure is well controlled.  ASSESSMENT AND PLAN:  Type 2 diabetes mellitus without complication, with long-term current use of insulin (HCC)  Essential hypertension  Class 1 obesity with serious comorbidity and body mass index (BMI) of 31.0 to 31.9 in adult, unspecified obesity type  PLAN:  Diabetes  II Sandra [name] has been given extensive diabetes education by myself today including ideal fasting and post-prandial blood glucose readings, individual ideal Hgb A1c goals and hypoglycemia prevention. We discussed the importance of good blood sugar control to decrease the likelihood of diabetic complications such as nephropathy, neuropathy, limb loss, blindness, coronary artery disease, and death. We discussed the importance of intensive lifestyle modification including diet, exercise and weight loss as the first line treatment for diabetes. Sandra [name] agrees to continue her diabetes medications and will follow up at the agreed upon time.  Hypertension We discussed sodium restriction, working on healthy weight loss, and a regular exercise program as the means to achieve improved blood pressure control. Sandra [name] agreed with this plan and agreed to follow up as directed. We will continue to monitor her blood pressure as well as her progress with the above lifestyle modifications. She will continue her medications as prescribed and will watch for signs of hypotension as she continues her lifestyle modifications.  Obesity Sandra [name] is currently in the action stage of change. As such, her goal is to continue with weight loss efforts She has agreed to follow the Category 1 plan Sandra [name] will continue her exercise regimen (walking) for weight loss and overall health benefits. We discussed the following Behavioral Modification Strategies today: increase H2O intake, no skipping meals, keeping healthy foods in the home, increasing lean protein intake, decreasing simple carbohydrates, increasing vegetables, decrease eating out and work on meal planning and easy cooking plans Sandra [name] will weigh herself at home before each visit.  Sandra [name] has agreed to follow up with our clinic in 2 weeks. She was informed of the importance of frequent follow up visits to maximize her success with intensive lifestyle modifications for her multiple  health conditions.  ALLERGIES: Allergies  Allergen Reactions   Erythromycin    Epinephrine Palpitations   Metformin And Related Rash    MEDICATIONS: Current Outpatient Medications on File Prior to Visit  Medication Sig Dispense Refill   amitriptyline (ELAVIL) 25 MG tablet Take one to three (1-3) tablets (25 mg to 75 mg total) by mouth twice daily as needed for back pain.     aspirin EC 81 MG tablet Take 1 tablet (81 mg total) by mouth daily. 90 tablet 3   BIOTIN PO Take by mouth.     CALCIUM PO Take by mouth daily. Reported on 02/24/2016     cetirizine (ZYRTEC) 10 MG tablet Take 10 mg by mouth daily.     Cinnamon 500 MG capsule Take 500 mg by mouth daily.     co-enzyme Q-10 30 MG capsule Take 30 mg by mouth 3 (three) times daily.     diclofenac sodium (VOLTAREN) 1 % GEL Apply topically 4 (four) times daily.     empagliflozin (JARDIANCE) 25 MG TABS tablet Take 25 mg by mouth daily.     ezetimibe (ZETIA) 10 MG tablet Take 1 tablet (10 mg total) by mouth daily. 90 tablet 3   fluticasone (FLONASE) 50 MCG/ACT nasal spray Place 2 sprays into the nose daily.     guaiFENesin (MUCINEX) 600 MG 12 hr tablet Take 600 mg by mouth daily.       ibuprofen (ADVIL,MOTRIN) 100 MG tablet Take 100 mg by mouth every 6 (six) hours as needed.       Insulin Glargine (BASAGLAR KWIKPEN [place]) Inject 16 Units into the skin.      liraglutide (VICTOZA) 18 MG/3ML SOPN Inject 1.8 mg into the skin daily.     lisinopril (PRINIVIL,ZESTRIL) 2.5 MG tablet Take 2.5 mg by mouth daily.     metoprolol (TOPROL-XL) 50 MG 24 hr tablet Take 25 mg by mouth daily.      Multiple Vitamin (MULTIVITAMIN) tablet Take 1 tablet by mouth daily. Reported on 02/24/2016     oxybutynin (DITROPAN-XL) 10 MG 24 hr tablet Take 10 mg by mouth at bedtime.     rosuvastatin (CRESTOR) 10 MG tablet Take 10 mg by mouth once a week.      tiZANidine (ZANAFLEX) 4 MG tablet Take 4 mg by mouth every 6 (six) hours as needed for muscle  spasms (back pain).     varenicline (CHANTIX STARTING MONTH PAK) 0.5 MG X 11 & 1 MG X 42 tablet Take one 0.5 mg tablet by mouth once daily for 3 days, then increase to one 0.5 mg tablet twice daily for 4 days, then increase to one 1 mg tablet twice daily. 53 tablet 0   VITAMIN D, CHOLECALCIFEROL, PO Take 2,000 mg by mouth daily.      No current facility-administered medications on file prior to visit.     PAST MEDICAL HISTORY: Past Medical History:  Diagnosis Date   Arthritis    Back pain    Chronic kidney disease    Congenital absence of left kidney    Congenital absence of left ovary    Coronary artery disease 2004   STATUS POST STENTING OF THE RIGHT CORONARY OSTIUM    DDD (degenerative disc disease)    Diabetes mellitus without complication ([place])  Dislocated shoulder    left   DVT (deep venous thrombosis) (HCC)    left posterior tibial vein   High blood pressure    Hypercholesterolemia    Joint pain    Osteoarthritis    Peripheral vascular disease (HCC)    Sleep apnea    Sleep apnea    SOB (shortness of breath)    Tobacco dependence    Vitamin D deficiency     PAST SURGICAL HISTORY: Past Surgical History:  Procedure Laterality Date   ANGIOPLASTY  2004   bone spur shoulder     FOOT SURGERY     plantar fascitis   KNEE SURGERY  1950   TOTAL ABDOMINAL HYSTERECTOMY     TUBAL LIGATION     VEIN BYPASS SURGERY     2014    SOCIAL HISTORY: Social History   Tobacco Use   Smoking status: Former Smoker    Packs/day: 1.00    Last attempt to quit: 10/27/2015    Years since quitting: 3.2   Smokeless tobacco: Never Used  Substance Use Topics   Alcohol use: No    Alcohol/week: 0.0 standard drinks   Drug use: No    FAMILY HISTORY: Family History  Problem Relation Age of Onset   Leukemia Sister    Hypertension Brother    Heart disease Father        before age 7   High blood pressure Father    Sudden death Father     Breast cancer Neg Hx     ROS: Review of Systems  Constitutional: Negative for weight loss.  Respiratory: Negative for shortness of breath (on exertion).   Cardiovascular: Negative for chest pain.  Endo/Heme/Allergies:       Negative for hypoglycemia    PHYSICAL EXAM: Pt in no acute distress  RECENT LABS AND TESTS: BMET    Component Value Date/Time   NA 144 11/14/2018 1039   K 4.8 11/14/2018 1039   CL 103 11/14/2018 1039   CO2 24 11/14/2018 1039   GLUCOSE 122 (H) 11/14/2018 1039   GLUCOSE 115 (H) 10/14/2011 0931   BUN 20 11/14/2018 1039   CREATININE 1.09 (H) 11/14/2018 1039   CALCIUM 9.4 11/14/2018 1039   GFRNONAA 50 (L) 11/14/2018 1039   GFRAA 57 (L) 11/14/2018 1039   Lab Results  Component Value Date   HGBA1C 6.9 (H) 11/14/2018   No results found for: INSULIN CBC No results found for: WBC, RBC, HGB, HCT, PLT, MCV, MCH, MCHC, RDW, LYMPHSABS, MONOABS, EOSABS, BASOSABS Iron/TIBC/Ferritin/ %Sat No results found for: IRON, TIBC, FERRITIN, IRONPCTSAT Lipid Panel     Component Value Date/Time   CHOL 138 12/20/2018 0850   TRIG 159 (H) 12/20/2018 0850   HDL 47 12/20/2018 0850   CHOLHDL 2.9 12/20/2018 0850   CHOLHDL 3 10/14/2011 0931   VLDL 21.6 10/14/2011 0931   LDLCALC 59 12/20/2018 0850   Hepatic Function Panel     Component Value Date/Time   PROT 6.7 12/20/2018 0850   ALBUMIN 4.3 12/20/2018 0850   AST 20 12/20/2018 0850   ALT 21 12/20/2018 0850   ALKPHOS 74 12/20/2018 0850   BILITOT 0.3 12/20/2018 0850   BILIDIR 0.11 12/20/2018 0850   No results found for: TSH  Results for [place], Foster H "JUDY" (MRN [id number]) as of 01/31/2019 17:04  Ref. Range 11/14/2018 10:39  Vitamin D, 25-Hydroxy Latest Ref Range: 30.0 - 100.0 ng/mL 79.4    I, Doreene Nest, am acting as Location manager for [employer]. Owens Shark,  DO  I have reviewed the above documentation for accuracy and completeness, and I agree with the above. -[name], DO

## 2019-02-01 DIAGNOSIS — R69 Illness, unspecified: Secondary | ICD-10-CM | POA: Diagnosis not present

## 2019-02-04 ENCOUNTER — Other Ambulatory Visit: Payer: Self-pay

## 2019-02-04 DIAGNOSIS — I6522 Occlusion and stenosis of left carotid artery: Secondary | ICD-10-CM

## 2019-02-04 DIAGNOSIS — R0989 Other specified symptoms and signs involving the circulatory and respiratory systems: Secondary | ICD-10-CM

## 2019-02-05 ENCOUNTER — Telehealth (HOSPITAL_COMMUNITY): Payer: Self-pay | Admitting: Rehabilitation

## 2019-02-05 NOTE — Telephone Encounter (Signed)

## 2019-02-06 ENCOUNTER — Ambulatory Visit (HOSPITAL_COMMUNITY)
Admission: RE | Admit: 2019-02-06 | Discharge: 2019-02-06 | Disposition: A | Discharge status: Home / Self Care | Payer: Medicare HMO | Source: Ambulatory Visit | Attending: Family | Admitting: Family

## 2019-02-06 ENCOUNTER — Ambulatory Visit (INDEPENDENT_AMBULATORY_CARE_PROVIDER_SITE_OTHER): Payer: Medicare HMO | Admitting: Vascular Surgery

## 2019-02-06 ENCOUNTER — Encounter: Payer: Self-pay | Admitting: Vascular Surgery

## 2019-02-06 ENCOUNTER — Other Ambulatory Visit: Payer: Self-pay

## 2019-02-06 VITALS — BP 109/72 | HR 75 | Temp 97.8°F | Resp 12 | Ht 61.0 in | Wt 169.9 lb

## 2019-02-06 DIAGNOSIS — R0989 Other specified symptoms and signs involving the circulatory and respiratory systems: Secondary | ICD-10-CM | POA: Insufficient documentation

## 2019-02-06 DIAGNOSIS — I6522 Occlusion and stenosis of left carotid artery: Secondary | ICD-10-CM | POA: Diagnosis not present

## 2019-02-06 DIAGNOSIS — I872 Venous insufficiency (chronic) (peripheral): Secondary | ICD-10-CM

## 2019-02-06 NOTE — Progress Notes (Signed)
Patient name: Sandra [name] MRN: [id number] DOB: 12-17-1945 Sex: female  REASON FOR VISIT:   Follow-up of carotid stenosis.  HPI:   Sandra [name] is a pleasant 73 y.o. female who I saw in consultation on 10/24/2018 with carotid disease.  A carotid duplex scan that was done on 07/18/2018 showed a 60 to 79% left carotid stenosis with a less than 39% right carotid stenosis.  I explained that I would not consider carotid endarterectomy unless she became symptomatic with a stenosis progressed to greater than 80%.  I recommended a 8-month follow-up visit from her previous study.  She was on aspirin and was on a statin.  Since I saw her last she denies any history of stroke, TIAs, expressive or receptive aphasia, or amaurosis fugax.  She does continue to have some varicose veins in the right leg and describes an aching pain and heaviness in her legs which is aggravated by sitting and relieved with standing.  She states that she has been more active and has lost some weight.  Past Medical History:  Diagnosis Date  . Arthritis   . Back pain   . Chronic kidney disease   . Congenital absence of left kidney   . Congenital absence of left ovary   . Coronary artery disease 2004   STATUS POST STENTING OF THE RIGHT CORONARY OSTIUM   . DDD (degenerative disc disease)   . Diabetes mellitus without complication ([place])   . Dislocated shoulder    left  . DVT (deep venous thrombosis) (HCC)    left posterior tibial vein  . High blood pressure   . Hypercholesterolemia   . Joint pain   . Osteoarthritis   . Peripheral vascular disease ([place])   . Sleep apnea   . Sleep apnea   . SOB (shortness of breath)   . Tobacco dependence   . Vitamin D deficiency     Family History  Problem Relation Age of Onset  . Leukemia Sister   . Hypertension Brother   . Heart disease Father        before age 67  . High blood pressure Father   . Sudden death Father   . Breast cancer Neg Hx     SOCIAL HISTORY:  Social History   Tobacco Use  . Smoking status: Former Smoker    Packs/day: 1.00    Last attempt to quit: 10/27/2015    Years since quitting: 3.2  . Smokeless tobacco: Never Used  Substance Use Topics  . Alcohol use: No    Alcohol/week: 0.0 standard drinks    Allergies  Allergen Reactions  . Erythromycin   . Epinephrine Palpitations  . Metformin And Related Rash    Current Outpatient Medications  Medication Sig Dispense Refill  . amitriptyline (ELAVIL) 25 MG tablet Take one to three (1-3) tablets (25 mg to 75 mg total) by mouth twice daily as needed for back pain.    Marland Kitchen aspirin EC 81 MG tablet Take 1 tablet (81 mg total) by mouth daily. 90 tablet 3  . BIOTIN PO Take by mouth.    Marland Kitchen CALCIUM PO Take by mouth daily. Reported on 02/24/2016    . cetirizine (ZYRTEC) 10 MG tablet Take 10 mg by mouth daily.    . Cinnamon 500 MG capsule Take 500 mg by mouth daily.    . empagliflozin (JARDIANCE) 25 MG TABS tablet Take 25 mg by mouth daily.    Marland Kitchen ezetimibe (ZETIA) 10 MG tablet Take 1 tablet (  10 mg total) by mouth daily. 90 tablet 3  . fluticasone (FLONASE) 50 MCG/ACT nasal spray Place 2 sprays into the nose daily.    Marland Kitchen ibuprofen (ADVIL,MOTRIN) 100 MG tablet Take 100 mg by mouth every 6 (six) hours as needed.      . Insulin Glargine (BASAGLAR KWIKPEN [place]) Inject 16 Units into the skin.     Marland Kitchen liraglutide (VICTOZA) 18 MG/3ML SOPN Inject 1.8 mg into the skin daily.    Marland Kitchen lisinopril (PRINIVIL,ZESTRIL) 2.5 MG tablet Take 2.5 mg by mouth daily.    . metoprolol (TOPROL-XL) 50 MG 24 hr tablet Take 25 mg by mouth daily.     . Multiple Vitamin (MULTIVITAMIN) tablet Take 1 tablet by mouth daily. Reported on 02/24/2016    . oxybutynin (DITROPAN-XL) 10 MG 24 hr tablet Take 10 mg by mouth at bedtime.    . rosuvastatin (CRESTOR) 10 MG tablet Take 10 mg by mouth once a week.     Marland Kitchen tiZANidine (ZANAFLEX) 4 MG tablet Take 4 mg by mouth every 6 (six) hours as needed for muscle spasms (back pain).    . varenicline  (CHANTIX STARTING MONTH PAK) 0.5 MG X 11 & 1 MG X 42 tablet Take one 0.5 mg tablet by mouth once daily for 3 days, then increase to one 0.5 mg tablet twice daily for 4 days, then increase to one 1 mg tablet twice daily. 53 tablet 0  . VITAMIN D, CHOLECALCIFEROL, PO Take 2,000 mg by mouth daily.      No current facility-administered medications for this visit.     REVIEW OF SYSTEMS:  [X]  denotes positive finding, [ ]  denotes negative finding Cardiac  Comments:  Chest pain or chest pressure:    Shortness of breath upon exertion:    Short of breath when lying flat:    Irregular heart rhythm:        Vascular    Pain in calf, thigh, or hip brought on by ambulation: x   Pain in feet at night that wakes you up from your sleep:     Blood clot in your veins:    Leg swelling:         Pulmonary    Oxygen at home:    Productive cough:     Wheezing:         Neurologic    Sudden weakness in arms or legs:     Sudden numbness in arms or legs:     Sudden onset of difficulty speaking or slurred speech:    Temporary loss of vision in one eye:     Problems with dizziness:         Gastrointestinal    Blood in stool:     Vomited blood:         Genitourinary    Burning when urinating:     Blood in urine:        Psychiatric    Major depression:         Hematologic    Bleeding problems:    Problems with blood clotting too easily:        Skin    Rashes or ulcers:        Constitutional    Fever or chills:     PHYSICAL EXAM:   Vitals:   02/06/19 1249  BP: 109/72  Pulse: 75  Resp: 12  Temp: 97.8 F (36.6 C)  TempSrc: Oral  SpO2: 99%  Weight: 169 lb 14.4 oz (77.1 kg)  Height: 5\' 1"  (1.549 m)    GENERAL: The patient is a well-nourished female, in no acute distress. The vital signs are documented above. CARDIAC: There is a regular rate and rhythm.  VASCULAR: She has a right carotid bruit. Both feet are warm and well-perfused. PULMONARY: There is good air exchange bilaterally  without wheezing or rales. ABDOMEN: Soft and non-tender with normal pitched bowel sounds.  MUSCULOSKELETAL: There are no major deformities or cyanosis. NEUROLOGIC: No focal weakness or paresthesias are detected. SKIN: There are no ulcers or rashes noted. PSYCHIATRIC: The patient has a normal affect.  DATA:    CAROTID DUPLEX: I have independently interpreted her carotid duplex scan today.  On the left side, which is the side of concern, there is a 60 to 79% carotid stenosis.  There is been no significant progression of the disease on the left.  On the right side there is a less than 39% stenosis.  Both vertebral arteries are patent with antegrade flow.  MEDICAL ISSUES:   LEFT CAROTID STENOSIS: This patient has an asymptomatic 60 to 79% left carotid stenosis.  This has not changed in the last 6 months.  I have recommended a follow-up duplex scan in 6 months and will have her see the nurse practitioner at that time.  She is on aspirin and is on a statin.  I explained that we would not consider left carotid endarterectomy once the stenosis progressed to greater than 80% or she develop new neurologic symptoms.  Of note she has no right carotid stenosis and no evidence of vertebral artery disease.  CHRONIC VENOUS INSUFFICIENCY: She did want to discuss compression stockings again today.  She has a hard time getting these on so we tried a very mild gradient of 8 to 15 mmHg.  I have written a prescription for this and we again discussed conservative treatment of venous disease including leg elevation, compression therapy, avoiding prolonged sitting and standing, exercise, and weight management.  Deitra Mayo [facility] [phone]

## 2019-02-13 DIAGNOSIS — Z794 Long term (current) use of insulin: Secondary | ICD-10-CM | POA: Diagnosis not present

## 2019-02-13 DIAGNOSIS — E1159 Type 2 diabetes mellitus with other circulatory complications: Secondary | ICD-10-CM | POA: Diagnosis not present

## 2019-02-13 DIAGNOSIS — I2510 Atherosclerotic heart disease of native coronary artery without angina pectoris: Secondary | ICD-10-CM | POA: Diagnosis not present

## 2019-02-13 DIAGNOSIS — E782 Mixed hyperlipidemia: Secondary | ICD-10-CM | POA: Diagnosis not present

## 2019-02-13 DIAGNOSIS — R7989 Other specified abnormal findings of blood chemistry: Secondary | ICD-10-CM | POA: Diagnosis not present

## 2019-02-13 DIAGNOSIS — E669 Obesity, unspecified: Secondary | ICD-10-CM | POA: Diagnosis not present

## 2019-02-14 ENCOUNTER — Other Ambulatory Visit: Payer: Self-pay

## 2019-02-14 ENCOUNTER — Encounter (INDEPENDENT_AMBULATORY_CARE_PROVIDER_SITE_OTHER): Payer: Self-pay | Admitting: Bariatrics

## 2019-02-14 ENCOUNTER — Ambulatory Visit (INDEPENDENT_AMBULATORY_CARE_PROVIDER_SITE_OTHER): Payer: Medicare HMO | Admitting: Bariatrics

## 2019-02-14 DIAGNOSIS — I10 Essential (primary) hypertension: Secondary | ICD-10-CM | POA: Diagnosis not present

## 2019-02-14 DIAGNOSIS — Z794 Long term (current) use of insulin: Secondary | ICD-10-CM | POA: Diagnosis not present

## 2019-02-14 DIAGNOSIS — Z6831 Body mass index (BMI) 31.0-31.9, adult: Secondary | ICD-10-CM | POA: Diagnosis not present

## 2019-02-14 DIAGNOSIS — E119 Type 2 diabetes mellitus without complications: Secondary | ICD-10-CM | POA: Diagnosis not present

## 2019-02-14 DIAGNOSIS — F3289 Other specified depressive episodes: Secondary | ICD-10-CM

## 2019-02-14 DIAGNOSIS — R69 Illness, unspecified: Secondary | ICD-10-CM | POA: Diagnosis not present

## 2019-02-14 DIAGNOSIS — E669 Obesity, unspecified: Secondary | ICD-10-CM

## 2019-02-14 MED ORDER — TROKENDI XR 50 MG PO CP24: 50.0000 mg | ORAL_CAPSULE | Freq: Every day | ORAL | 0 refills | Status: DC

## 2019-02-15 DIAGNOSIS — H524 Presbyopia: Secondary | ICD-10-CM | POA: Diagnosis not present

## 2019-02-15 DIAGNOSIS — E119 Type 2 diabetes mellitus without complications: Secondary | ICD-10-CM | POA: Diagnosis not present

## 2019-02-15 DIAGNOSIS — H2513 Age-related nuclear cataract, bilateral: Secondary | ICD-10-CM | POA: Diagnosis not present

## 2019-02-15 DIAGNOSIS — H0011 Chalazion right upper eyelid: Secondary | ICD-10-CM | POA: Diagnosis not present

## 2019-02-18 NOTE — Progress Notes (Signed)
Office: [phone]  /  Fax: [phone] TeleHealth Visit:  Sandra [name] has verbally consented to this TeleHealth visit today. The patient is located at home, the provider is located at the [employer] [facility] office. The participants in this visit include the listed provider and patient and any and all parties involved. The visit was conducted today via FaceTime.  HPI:   Chief Complaint: OBESITY Sandra [name] is here to discuss her progress with her obesity treatment plan. She is on the Category 1 plan and is following her eating plan approximately 70 % of the time. She states she is walking 8,000 steps 7 times per week. Sandra [name] has gained 1 pound (weight 164 lbs). She is still having cravings all day long. We were unable to weigh the patient today for this TeleHealth visit. She feels as if she has gained weight since her last visit. She has lost 16 lbs since starting treatment with [name].  Diabetes II Sandra [name] has a diagnosis of diabetes type II. Sandra [name] states fasting BGs are 135 and average between 130 and 140. She is taking Jardiance and Victoza. She denies any hypoglycemic episodes. Last A1c was at 6.9 She has been working on intensive lifestyle modifications including diet, exercise, and weight loss to help control her blood glucose levels.  Hypertension Sandra [name] is a 73 y.o. female with hypertension. She Is taking Zestril and Toprol. Sandra [name] denies chest pain or shortness of breath on exertion. She is working weight loss to help control her blood pressure with the goal of decreasing her risk of heart attack and stroke. Sandra [name] blood pressure is well controlled.  Depression with emotional eating behaviors Sandra [name] is struggling with emotional eating and using food for comfort to the extent that it is negatively impacting her health. She often snacks when she is not hungry. Sandra [name] sometimes feels she is out of control and then feels guilty that she made poor food choices. She has  been working on behavior modification techniques to help reduce her emotional eating and has been somewhat successful. She shows no sign of suicidal or homicidal ideations.  ASSESSMENT AND PLAN:  Type 2 diabetes mellitus without complication, with long-term current use of insulin (HCC)  Essential hypertension  Other depression - with emotional eating - Plan: Topiramate ER (TROKENDI XR) 50 MG CP24  Class 1 obesity with serious comorbidity and body mass index (BMI) of 31.0 to 31.9 in adult, unspecified obesity type  PLAN:  Diabetes II Sandra [name] has been given extensive diabetes education by myself today including ideal fasting and post-prandial blood glucose readings, individual ideal Hgb A1c goals and hypoglycemia prevention. We discussed the importance of good blood sugar control to decrease the likelihood of diabetic complications such as nephropathy, neuropathy, limb loss, blindness, coronary artery disease, and death. We discussed the importance of intensive lifestyle modification including diet, exercise and weight loss as the first line treatment for diabetes. She will continue to check fasting blood sugars and 2 hour post prandial blood sugars. Tora agrees to continue her diabetes medications and will follow up at the agreed upon time.  Hypertension We discussed sodium restriction, working on healthy weight loss, and a regular exercise program as the means to achieve improved blood pressure control. Sandra [name] agreed with this plan and agreed to follow up as directed. We will continue to monitor her blood pressure as well as her progress with the above lifestyle modifications. She will continue her medications as prescribed and will watch for signs of  hypotension as she continues her lifestyle modifications.  Depression with Emotional Eating Behaviors We discussed behavior modification techniques today to help Sandra [name] deal with her emotional eating and depression. She has agreed to take Trokendi  50 mg once daily #30 with no refills and follow up as directed.  Obesity Sandra [name] is currently in the action stage of change. As such, her goal is to continue with weight loss efforts She has agreed to follow the Category 1 plan Sandra [name] will continue her walking for weight loss and overall health benefits. We discussed the following Behavioral Modification Strategies today: planning for success, increase H2O intake, no skipping meals, keeping healthy foods in the home, increasing lean protein intake, decreasing simple carbohydrates, increasing vegetables, decrease eating out and work on meal planning and easy cooking plans Sandra [name] will weigh herself at home until she returns to the office. Fruit list was given to patient today.  Sandra [name] has agreed to follow up with our clinic in 2 weeks. She was informed of the importance of frequent follow up visits to maximize her success with intensive lifestyle modifications for her multiple health conditions.  ALLERGIES: Allergies  Allergen Reactions  . Erythromycin   . Epinephrine Palpitations  . Metformin And Related Rash    MEDICATIONS: Current Outpatient Medications on File Prior to Visit  Medication Sig Dispense Refill  . amitriptyline (ELAVIL) 25 MG tablet Take one to three (1-3) tablets (25 mg to 75 mg total) by mouth twice daily as needed for back pain.    Marland Kitchen aspirin EC 81 MG tablet Take 1 tablet (81 mg total) by mouth daily. 90 tablet 3  . BIOTIN PO Take by mouth.    Marland Kitchen CALCIUM PO Take by mouth daily. Reported on 02/24/2016    . cetirizine (ZYRTEC) 10 MG tablet Take 10 mg by mouth daily.    . Cinnamon 500 MG capsule Take 500 mg by mouth daily.    . empagliflozin (JARDIANCE) 25 MG TABS tablet Take 25 mg by mouth daily.    Marland Kitchen ezetimibe (ZETIA) 10 MG tablet Take 1 tablet (10 mg total) by mouth daily. 90 tablet 3  . fluticasone (FLONASE) 50 MCG/ACT nasal spray Place 2 sprays into the nose daily.    Marland Kitchen ibuprofen (ADVIL,MOTRIN) 100 MG tablet Take 100 mg  by mouth every 6 (six) hours as needed.      . Insulin Glargine (BASAGLAR KWIKPEN [place]) Inject 16 Units into the skin.     Marland Kitchen liraglutide (VICTOZA) 18 MG/3ML SOPN Inject 1.8 mg into the skin daily.    Marland Kitchen lisinopril (PRINIVIL,ZESTRIL) 2.5 MG tablet Take 2.5 mg by mouth daily.    . metoprolol (TOPROL-XL) 50 MG 24 hr tablet Take 25 mg by mouth daily.     . Multiple Vitamin (MULTIVITAMIN) tablet Take 1 tablet by mouth daily. Reported on 02/24/2016    . oxybutynin (DITROPAN-XL) 10 MG 24 hr tablet Take 10 mg by mouth at bedtime.    . rosuvastatin (CRESTOR) 10 MG tablet Take 10 mg by mouth once a week.     Marland Kitchen tiZANidine (ZANAFLEX) 4 MG tablet Take 4 mg by mouth every 6 (six) hours as needed for muscle spasms (back pain).    . varenicline (CHANTIX STARTING MONTH PAK) 0.5 MG X 11 & 1 MG X 42 tablet Take one 0.5 mg tablet by mouth once daily for 3 days, then increase to one 0.5 mg tablet twice daily for 4 days, then increase to one 1 mg tablet twice daily. 53 tablet 0  .  VITAMIN D, CHOLECALCIFEROL, PO Take 2,000 mg by mouth daily.      No current facility-administered medications on file prior to visit.     PAST MEDICAL HISTORY: Past Medical History:  Diagnosis Date  . Arthritis   . Back pain   . Chronic kidney disease   . Congenital absence of left kidney   . Congenital absence of left ovary   . Coronary artery disease 2004   STATUS POST STENTING OF THE RIGHT CORONARY OSTIUM   . DDD (degenerative disc disease)   . Diabetes mellitus without complication ([place])   . Dislocated shoulder    left  . DVT (deep venous thrombosis) (HCC)    left posterior tibial vein  . High blood pressure   . Hypercholesterolemia   . Joint pain   . Osteoarthritis   . Peripheral vascular disease ([place])   . Sleep apnea   . Sleep apnea   . SOB (shortness of breath)   . Tobacco dependence   . Vitamin D deficiency     PAST SURGICAL HISTORY: Past Surgical History:  Procedure Laterality Date  . ANGIOPLASTY  2004  .  bone spur shoulder    . FOOT SURGERY     plantar fascitis  . KNEE SURGERY  1950  . TOTAL ABDOMINAL HYSTERECTOMY    . TUBAL LIGATION    . VEIN BYPASS SURGERY     2014    SOCIAL HISTORY: Social History   Tobacco Use  . Smoking status: Former Smoker    Packs/day: 1.00    Quit date: 10/27/2015    Years since quitting: 3.3  . Smokeless tobacco: Never Used  Substance Use Topics  . Alcohol use: No    Alcohol/week: 0.0 standard drinks  . Drug use: No    FAMILY HISTORY: Family History  Problem Relation Age of Onset  . Leukemia Sister   . Hypertension Brother   . Heart disease Father        before age 46  . High blood pressure Father   . Sudden death Father   . Breast cancer Neg Hx     ROS: Review of Systems  Constitutional: Negative for weight loss.  Respiratory: Negative for shortness of breath (on exertion).   Cardiovascular: Negative for chest pain.  Endo/Heme/Allergies:       Negative for hypoglycemia  Psychiatric/Behavioral: Positive for depression. Negative for suicidal ideas.    PHYSICAL EXAM: Pt in no acute distress  RECENT LABS AND TESTS: BMET    Component Value Date/Time   NA 144 11/14/2018 1039   K 4.8 11/14/2018 1039   CL 103 11/14/2018 1039   CO2 24 11/14/2018 1039   GLUCOSE 122 (H) 11/14/2018 1039   GLUCOSE 115 (H) 10/14/2011 0931   BUN 20 11/14/2018 1039   CREATININE 1.09 (H) 11/14/2018 1039   CALCIUM 9.4 11/14/2018 1039   GFRNONAA 50 (L) 11/14/2018 1039   GFRAA 57 (L) 11/14/2018 1039   Lab Results  Component Value Date   HGBA1C 6.9 (H) 11/14/2018   No results found for: INSULIN CBC No results found for: WBC, RBC, HGB, HCT, PLT, MCV, MCH, MCHC, RDW, LYMPHSABS, MONOABS, EOSABS, BASOSABS Iron/TIBC/Ferritin/ %Sat No results found for: IRON, TIBC, FERRITIN, IRONPCTSAT Lipid Panel     Component Value Date/Time   CHOL 138 12/20/2018 0850   TRIG 159 (H) 12/20/2018 0850   HDL 47 12/20/2018 0850   CHOLHDL 2.9 12/20/2018 0850   CHOLHDL 3  10/14/2011 0931   VLDL 21.6 10/14/2011 0931  LDLCALC 59 12/20/2018 0850   Hepatic Function Panel     Component Value Date/Time   PROT 6.7 12/20/2018 0850   ALBUMIN 4.3 12/20/2018 0850   AST 20 12/20/2018 0850   ALT 21 12/20/2018 0850   ALKPHOS 74 12/20/2018 0850   BILITOT 0.3 12/20/2018 0850   BILIDIR 0.11 12/20/2018 0850   No results found for: TSH   Ref. Range 11/14/2018 10:39  Vitamin D, 25-Hydroxy Latest Ref Range: 30.0 - 100.0 ng/mL 79.4    I, Doreene Nest, am acting as Location manager for [employer]. Owens Shark, DO   I have reviewed the above documentation for accuracy and completeness, and I agree with the above. -[name], DO

## 2019-02-21 ENCOUNTER — Encounter (INDEPENDENT_AMBULATORY_CARE_PROVIDER_SITE_OTHER): Payer: Self-pay

## 2019-02-26 ENCOUNTER — Telehealth (INDEPENDENT_AMBULATORY_CARE_PROVIDER_SITE_OTHER): Payer: Medicare HMO | Admitting: Bariatrics

## 2019-02-26 ENCOUNTER — Other Ambulatory Visit: Payer: Self-pay

## 2019-02-26 ENCOUNTER — Encounter (INDEPENDENT_AMBULATORY_CARE_PROVIDER_SITE_OTHER): Payer: Self-pay | Admitting: Bariatrics

## 2019-02-26 DIAGNOSIS — Z6830 Body mass index (BMI) 30.0-30.9, adult: Secondary | ICD-10-CM

## 2019-02-26 DIAGNOSIS — R69 Illness, unspecified: Secondary | ICD-10-CM | POA: Diagnosis not present

## 2019-02-26 DIAGNOSIS — F3289 Other specified depressive episodes: Secondary | ICD-10-CM

## 2019-02-26 DIAGNOSIS — E119 Type 2 diabetes mellitus without complications: Secondary | ICD-10-CM

## 2019-02-26 DIAGNOSIS — E669 Obesity, unspecified: Secondary | ICD-10-CM

## 2019-02-26 DIAGNOSIS — Z794 Long term (current) use of insulin: Secondary | ICD-10-CM | POA: Diagnosis not present

## 2019-02-27 NOTE — Progress Notes (Signed)
Office: [phone]  /  Fax: [phone] TeleHealth Visit:  Sandra [name] has verbally consented to this TeleHealth visit today[place] located at the [employer] [facility] office. The participants in this visit include the listed provider and patient. The visit was conducted today via FaceTime.  HPI:   Chief Complaint: OBESITY Sandra [name] is here to discuss her progress with her obesity treatment plan. She is on the Category 1 plan and is following her eating plan approximately 60% of the time. She states she is exercising 0 minutes 0 times per week. Sandra [name] states that her weight has remained the same. She reports she has not been exercising as much. She is doing well with her water intake and reports eating more sweets and less protein. We were unable to weigh the patient today for this TeleHealth visit. She feels as if she has maintained her weight since her last visit. She has lost 15 lbs since starting treatment with [name].  Diabetes II Sandra [name] has a diagnosis of diabetes type II and is taking Jardiance and Victoza. [place] states fasting blood sugars are in the 120's and 2-hour postprandials are in the 130's. Last A1c was 6.9 on 11/14/2018. She has been working on intensive lifestyle modifications including diet, exercise, and weight loss to help control her blood glucose levels.  Depression with emotional eating behaviors [name] is struggling with emotional eating and using food for comfort to the extent that it is negatively impacting her health. She often snacks when she is not hungry. [name] sometimes feels she is out of control and then feels guilty that she made poor food choices. She has been working on behavior modification techniques to help reduce her emotional eating and has been somewhat successful. She was given a prescription for Trokendi. She shows no sign of suicidal or homicidal ideations.  Depression screen PHQ 2/9 05/28/2018   Decreased Interest 1  Down, Depressed, Hopeless 1  PHQ - 2 Score 2  Altered sleeping 1  Tired, decreased energy 2  Change in appetite 1  Feeling bad or failure about yourself  1  Trouble concentrating 0  Moving slowly or fidgety/restless 1  Suicidal thoughts 0  PHQ-9 Score 8  Difficult doing work/chores Not difficult at all   ASSESSMENT AND PLAN:  Type 2 diabetes mellitus without complication, with long-term current use of insulin (HCC)  Other depression - with emotional eating   Class 1 obesity with serious comorbidity and body mass index (BMI) of 30.0 to 30.9 in adult, unspecified obesity type  PLAN:  Diabetes II Sandra [name] has been given extensive diabetes education by myself today including ideal fasting and post-prandial blood glucose readings, individual ideal HgA1c goals  and hypoglycemia prevention. We discussed the importance of good blood sugar control to decrease the likelihood of diabetic complications such as nephropathy, neuropathy, limb loss, blindness, coronary artery disease, and death. We discussed the importance of intensive lifestyle modification including diet, exercise and weight loss as the first line treatment for diabetes. Sandra [name] agrees to continue her diabetes medications and will follow-up at the agreed upon time.  Depression with Emotional Eating Behaviors We discussed behavior modification techniques today to help Sandra [name] deal with her emotional eating and depression. Will have nurse check on Trokendi prescription.  Obesity Sandra [name] is currently in the action stage of change. As such, her goal is to continue with weight loss efforts. She has agreed to follow the Category 1 plan. Sandra [name] will weigh  herself at home and record, will work on meal planning, and will increase her water intake.   Sandra [name] has been instructed to get back to walking on the beach for weight loss and overall health benefits. We discussed the following Behavioral Modification Strategies  today: increasing lean protein intake, decreasing simple carbohydrates, increasing vegetables, increase H20 intake, decrease eating out, no skipping meals, work on meal planning and easy cooking plans, and keeping healthy foods in the home.  Sandra [name] has agreed to follow-up with our clinic in 2 weeks. She was informed of the importance of frequent follow-up visits to maximize her success with intensive lifestyle modifications for her multiple health conditions.  ALLERGIES: Allergies  Allergen Reactions  . Erythromycin   . Epinephrine Palpitations  . Metformin And Related Rash    MEDICATIONS: Current Outpatient Medications on File Prior to Visit  Medication Sig Dispense Refill  . amitriptyline (ELAVIL) 25 MG tablet Take one to three (1-3) tablets (25 mg to 75 mg total) by mouth twice daily as needed for back pain.    Marland Kitchen aspirin EC 81 MG tablet Take 1 tablet (81 mg total) by mouth daily. 90 tablet 3  . BIOTIN PO Take by mouth.    Marland Kitchen CALCIUM PO Take by mouth daily. Reported on 02/24/2016    . cetirizine (ZYRTEC) 10 MG tablet Take 10 mg by mouth daily.    . Cinnamon 500 MG capsule Take 500 mg by mouth daily.    . empagliflozin (JARDIANCE) 25 MG TABS tablet Take 25 mg by mouth daily.    Marland Kitchen ezetimibe (ZETIA) 10 MG tablet Take 1 tablet (10 mg total) by mouth daily. 90 tablet 3  . fluticasone (FLONASE) 50 MCG/ACT nasal spray Place 2 sprays into the nose daily.    Marland Kitchen ibuprofen (ADVIL,MOTRIN) 100 MG tablet Take 100 mg by mouth every 6 (six) hours as needed.      . Insulin Glargine (BASAGLAR KWIKPEN [place]) Inject 16 Units into the skin.     Marland Kitchen liraglutide (VICTOZA) 18 MG/3ML SOPN Inject 1.8 mg into the skin daily.    Marland Kitchen lisinopril (PRINIVIL,ZESTRIL) 2.5 MG tablet Take 2.5 mg by mouth daily.    . metoprolol (TOPROL-XL) 50 MG 24 hr tablet Take 25 mg by mouth daily.     . Multiple Vitamin (MULTIVITAMIN) tablet Take 1 tablet by mouth daily. Reported on 02/24/2016    . oxybutynin (DITROPAN-XL) 10 MG 24 hr tablet  Take 10 mg by mouth at bedtime.    . rosuvastatin (CRESTOR) 10 MG tablet Take 10 mg by mouth once a week.     Marland Kitchen tiZANidine (ZANAFLEX) 4 MG tablet Take 4 mg by mouth every 6 (six) hours as needed for muscle spasms (back pain).    . Topiramate ER (TROKENDI XR) 50 MG CP24 Take 50 mg by mouth daily. 30 capsule 0  . varenicline (CHANTIX STARTING MONTH PAK) 0.5 MG X 11 & 1 MG X 42 tablet Take one 0.5 mg tablet by mouth once daily for 3 days, then increase to one 0.5 mg tablet twice daily for 4 days, then increase to one 1 mg tablet twice daily. 53 tablet 0  . VITAMIN D, CHOLECALCIFEROL, PO Take 2,000 mg by mouth daily.      No current facility-administered medications on file prior to visit.     PAST MEDICAL HISTORY: Past Medical History:  Diagnosis Date  . Arthritis   . Back pain   . Chronic kidney disease   . Congenital absence of left  kidney   . Congenital absence of left ovary   . Coronary artery disease 2004   STATUS POST STENTING OF THE RIGHT CORONARY OSTIUM   . DDD (degenerative disc disease)   . Diabetes mellitus without complication ([place])   . Dislocated shoulder    left  . DVT (deep venous thrombosis) (HCC)    left posterior tibial vein  . High blood pressure   . Hypercholesterolemia   . Joint pain   . Osteoarthritis   . Peripheral vascular disease ([place])   . Sleep apnea   . Sleep apnea   . SOB (shortness of breath)   . Tobacco dependence   . Vitamin D deficiency     PAST SURGICAL HISTORY: Past Surgical History:  Procedure Laterality Date  . ANGIOPLASTY  2004  . bone spur shoulder    . FOOT SURGERY     plantar fascitis  . KNEE SURGERY  1950  . TOTAL ABDOMINAL HYSTERECTOMY    . TUBAL LIGATION    . VEIN BYPASS SURGERY     2014    SOCIAL HISTORY: Social History   Tobacco Use  . Smoking status: Former Smoker    Packs/day: 1.00    Quit date: 10/27/2015    Years since quitting: 3.3  . Smokeless tobacco: Never Used  Substance Use Topics  . Alcohol use: No     Alcohol/week: 0.0 standard drinks  . Drug use: No    FAMILY HISTORY: Family History  Problem Relation Age of Onset  . Leukemia Sister   . Hypertension Brother   . Heart disease Father        before age 38  . High blood pressure Father   . Sudden death Father   . Breast cancer Neg Hx    ROS: Review of Systems  Psychiatric/Behavioral: Positive for depression (emotional eating). Negative for suicidal ideas.       Negative for homicidal ideas.   PHYSICAL EXAM: Pt in no acute distress  RECENT LABS AND TESTS: BMET    Component Value Date/Time   NA 144 11/14/2018 1039   K 4.8 11/14/2018 1039   CL 103 11/14/2018 1039   CO2 24 11/14/2018 1039   GLUCOSE 122 (H) 11/14/2018 1039   GLUCOSE 115 (H) 10/14/2011 0931   BUN 20 11/14/2018 1039   CREATININE 1.09 (H) 11/14/2018 1039   CALCIUM 9.4 11/14/2018 1039   GFRNONAA 50 (L) 11/14/2018 1039   GFRAA 57 (L) 11/14/2018 1039   Lab Results  Component Value Date   HGBA1C 6.9 (H) 11/14/2018   No results found for: INSULIN CBC No results found for: WBC, RBC, HGB, HCT, PLT, MCV, MCH, MCHC, RDW, LYMPHSABS, MONOABS, EOSABS, BASOSABS Iron/TIBC/Ferritin/ %Sat No results found for: IRON, TIBC, FERRITIN, IRONPCTSAT Lipid Panel     Component Value Date/Time   CHOL 138 12/20/2018 0850   TRIG 159 (H) 12/20/2018 0850   HDL 47 12/20/2018 0850   CHOLHDL 2.9 12/20/2018 0850   CHOLHDL 3 10/14/2011 0931   VLDL 21.6 10/14/2011 0931   LDLCALC 59 12/20/2018 0850   Hepatic Function Panel     Component Value Date/Time   PROT 6.7 12/20/2018 0850   ALBUMIN 4.3 12/20/2018 0850   AST 20 12/20/2018 0850   ALT 21 12/20/2018 0850   ALKPHOS 74 12/20/2018 0850   BILITOT 0.3 12/20/2018 0850   BILIDIR 0.11 12/20/2018 0850   No results found for: TSH  Results for [place], Indigo H "JUDY" (MRN [id number]) as of 02/27/2019 07:57  Ref. Range  11/14/2018 10:39  Vitamin D, 25-Hydroxy Latest Ref Range: 30.0 - 100.0 ng/mL 79.4    I, Michaelene Song, am acting as  Location manager for [employer], DO  I have reviewed the above documentation for accuracy and completeness, and I agree with the above. -[name], DO

## 2019-03-05 DIAGNOSIS — I2510 Atherosclerotic heart disease of native coronary artery without angina pectoris: Secondary | ICD-10-CM | POA: Diagnosis not present

## 2019-03-05 DIAGNOSIS — E1159 Type 2 diabetes mellitus with other circulatory complications: Secondary | ICD-10-CM | POA: Diagnosis not present

## 2019-03-05 DIAGNOSIS — E1165 Type 2 diabetes mellitus with hyperglycemia: Secondary | ICD-10-CM | POA: Diagnosis not present

## 2019-03-05 DIAGNOSIS — M159 Polyosteoarthritis, unspecified: Secondary | ICD-10-CM | POA: Diagnosis not present

## 2019-03-05 DIAGNOSIS — E782 Mixed hyperlipidemia: Secondary | ICD-10-CM | POA: Diagnosis not present

## 2019-03-05 DIAGNOSIS — I10 Essential (primary) hypertension: Secondary | ICD-10-CM | POA: Diagnosis not present

## 2019-03-12 ENCOUNTER — Other Ambulatory Visit: Payer: Self-pay

## 2019-03-12 ENCOUNTER — Encounter (INDEPENDENT_AMBULATORY_CARE_PROVIDER_SITE_OTHER): Payer: Self-pay | Admitting: Bariatrics

## 2019-03-12 ENCOUNTER — Telehealth (INDEPENDENT_AMBULATORY_CARE_PROVIDER_SITE_OTHER): Payer: Medicare HMO | Admitting: Bariatrics

## 2019-03-12 DIAGNOSIS — Z794 Long term (current) use of insulin: Secondary | ICD-10-CM

## 2019-03-12 DIAGNOSIS — E119 Type 2 diabetes mellitus without complications: Secondary | ICD-10-CM

## 2019-03-12 DIAGNOSIS — R69 Illness, unspecified: Secondary | ICD-10-CM | POA: Diagnosis not present

## 2019-03-12 DIAGNOSIS — Z6834 Body mass index (BMI) 34.0-34.9, adult: Secondary | ICD-10-CM | POA: Diagnosis not present

## 2019-03-12 DIAGNOSIS — E669 Obesity, unspecified: Secondary | ICD-10-CM

## 2019-03-12 DIAGNOSIS — F3289 Other specified depressive episodes: Secondary | ICD-10-CM

## 2019-03-13 DIAGNOSIS — G4733 Obstructive sleep apnea (adult) (pediatric): Secondary | ICD-10-CM | POA: Diagnosis not present

## 2019-03-13 DIAGNOSIS — E1165 Type 2 diabetes mellitus with hyperglycemia: Secondary | ICD-10-CM | POA: Diagnosis not present

## 2019-03-13 DIAGNOSIS — E1159 Type 2 diabetes mellitus with other circulatory complications: Secondary | ICD-10-CM | POA: Diagnosis not present

## 2019-03-13 NOTE — Progress Notes (Signed)
Office: [phone]  /  Fax: [phone] TeleHealth Visit:  Sandra [name] has verbally consented to this TeleHealth visit today. The patient is located at home, the provider is located at the [facility] office. The participants in this visit include the listed provider and patient and any and all parties involved. The visit was conducted today via telephone ( 14 minutes ) Sandra [name] was unable to use realtime audiovisual technology today and the telehealth visit was conducted via telephone.  HPI:   Chief Complaint: OBESITY Sandra [name] is here to discuss her progress with her obesity treatment plan. She is on the Category 1 plan and is following her eating plan approximately 60 to 70 % of the time. She states she is walking 10,000 steps a day 6 times per week. Sandra [name] states that her weight remains the same (weight 165 lbs). She is struggling with the last 10 pounds. She will go to the beach next week. We were unable to weigh the patient today for this TeleHealth visit. She feels as if she has maintained weight since her last visit. She has lost 15 lbs since starting treatment with [name].  Diabetes II Sandra [name] has a diagnosis of diabetes type II. She is taking Jardiance, Engineer, agricultural and Victoza. Sandra [name] states fasting BGs range in the 130's and she denies any lows. Last A1c was at 6.9 She has been working on intensive lifestyle modifications including diet, exercise, and weight loss to help control her blood glucose levels.  Depression with emotional eating behaviors Sandra [name] struggles with emotional eating and using food for comfort to the extent that it is negatively impacting her health. She often snacks when she is not hungry. Sandra [name] sometimes feels she is out of control and then feels guilty that she made poor food choices. Sandra [name] was prescribed Trokendi and she started the medication four days ago. She has been working on behavior modification techniques to help reduce her emotional eating and  has been somewhat successful. She shows no sign of suicidal or homicidal ideations.  ASSESSMENT AND PLAN:  Type 2 diabetes mellitus without complication, with long-term current use of insulin (HCC)  Other depression  Class 1 obesity with serious comorbidity and body mass index (BMI) of 34.0 to 34.9 in adult, unspecified obesity type  PLAN:  Diabetes II Sandra [name] has been given extensive diabetes education by myself today including ideal fasting and post-prandial blood glucose readings, individual ideal Hgb A1c goals and hypoglycemia prevention. We discussed the importance of good blood sugar control to decrease the likelihood of diabetic complications such as nephropathy, neuropathy, limb loss, blindness, coronary artery disease, and death. We discussed the importance of intensive lifestyle modification including diet, exercise and weight loss as the first line treatment for diabetes. Sandra [name] will continue her diabetes medications and will follow up at the agreed upon time.  Depression with Emotional Eating Behaviors We discussed behavior modification techniques today to help Sandra [name] deal with her emotional eating and depression. She will continue Trokendi and follow up as directed.  Obesity Sandra [name] is currently in the action stage of change. As such, her goal is to continue with weight loss efforts She has agreed to follow the Category 1 plan Sandra [name] will continue walking 10,000 steps ([employer]) for weight loss and overall health benefits. We discussed the following Behavioral Modification Strategies today: increase H2O intake, no skipping meals, keeping healthy foods in the home, increasing lean protein intake, decreasing simple carbohydrates, increasing vegetables, decrease eating out and work on meal planning and intentional  eating Sandra [name] will cook at [employer], and grill at [employer].  Sandra [name] has agreed to follow up with our clinic in 2 weeks. She was informed of the importance of frequent  follow up visits to maximize her success with intensive lifestyle modifications for her multiple health conditions.  ALLERGIES: Allergies  Allergen Reactions   Erythromycin    Epinephrine Palpitations   Metformin And Related Rash    MEDICATIONS: Current Outpatient Medications on File Prior to Visit  Medication Sig Dispense Refill   amitriptyline (ELAVIL) 25 MG tablet Take one to three (1-3) tablets (25 mg to 75 mg total) by mouth twice daily as needed for back pain.     aspirin EC 81 MG tablet Take 1 tablet (81 mg total) by mouth daily. 90 tablet 3   BIOTIN PO Take by mouth.     CALCIUM PO Take by mouth daily. Reported on 02/24/2016     cetirizine (ZYRTEC) 10 MG tablet Take 10 mg by mouth daily.     Cinnamon 500 MG capsule Take 500 mg by mouth daily.     empagliflozin (JARDIANCE) 25 MG TABS tablet Take 25 mg by mouth daily.     ezetimibe (ZETIA) 10 MG tablet Take 1 tablet (10 mg total) by mouth daily. 90 tablet 3   fluticasone (FLONASE) 50 MCG/ACT nasal spray Place 2 sprays into the nose daily.     ibuprofen (ADVIL,MOTRIN) 100 MG tablet Take 100 mg by mouth every 6 (six) hours as needed.       Insulin Glargine (BASAGLAR KWIKPEN [place]) Inject 16 Units into the skin.      liraglutide (VICTOZA) 18 MG/3ML SOPN Inject 1.8 mg into the skin daily.     lisinopril (PRINIVIL,ZESTRIL) 2.5 MG tablet Take 2.5 mg by mouth daily.     metoprolol (TOPROL-XL) 50 MG 24 hr tablet Take 25 mg by mouth daily.      Multiple Vitamin (MULTIVITAMIN) tablet Take 1 tablet by mouth daily. Reported on 02/24/2016     oxybutynin (DITROPAN-XL) 10 MG 24 hr tablet Take 10 mg by mouth at bedtime.     rosuvastatin (CRESTOR) 10 MG tablet Take 10 mg by mouth once a week.      tiZANidine (ZANAFLEX) 4 MG tablet Take 4 mg by mouth every 6 (six) hours as needed for muscle spasms (back pain).     Topiramate ER (TROKENDI XR) 50 MG CP24 Take 50 mg by mouth daily. 30 capsule 0   varenicline (CHANTIX STARTING MONTH  PAK) 0.5 MG X 11 & 1 MG X 42 tablet Take one 0.5 mg tablet by mouth once daily for 3 days, then increase to one 0.5 mg tablet twice daily for 4 days, then increase to one 1 mg tablet twice daily. 53 tablet 0   VITAMIN D, CHOLECALCIFEROL, PO Take 2,000 mg by mouth daily.      No current facility-administered medications on file prior to visit.     PAST MEDICAL HISTORY: Past Medical History:  Diagnosis Date   Arthritis    Back pain    Chronic kidney disease    Congenital absence of left kidney    Congenital absence of left ovary    Coronary artery disease 2004   STATUS POST STENTING OF THE RIGHT CORONARY OSTIUM    DDD (degenerative disc disease)    Diabetes mellitus without complication (HCC)    Dislocated shoulder    left   DVT (deep venous thrombosis) (HCC)    left posterior tibial vein  High blood pressure    Hypercholesterolemia    Joint pain    Osteoarthritis    Peripheral vascular disease (HCC)    Sleep apnea    Sleep apnea    SOB (shortness of breath)    Tobacco dependence    Vitamin D deficiency     PAST SURGICAL HISTORY: Past Surgical History:  Procedure Laterality Date   ANGIOPLASTY  2004   bone spur shoulder     FOOT SURGERY     plantar fascitis   KNEE SURGERY  1950   TOTAL ABDOMINAL HYSTERECTOMY     TUBAL LIGATION     VEIN BYPASS SURGERY     2014    SOCIAL HISTORY: Social History   Tobacco Use   Smoking status: Former Smoker    Packs/day: 1.00    Quit date: 10/27/2015    Years since quitting: 3.3   Smokeless tobacco: Never Used  Substance Use Topics   Alcohol use: No    Alcohol/week: 0.0 standard drinks   Drug use: No    FAMILY HISTORY: Family History  Problem Relation Age of Onset   Leukemia Sister    Hypertension Brother    Heart disease Father        before age 33   High blood pressure Father    Sudden death Father    Breast cancer Neg Hx     ROS: Review of Systems  Constitutional: Negative  for weight loss.  Endo/Heme/Allergies:       Negative for hypoglycemia  Psychiatric/Behavioral: Positive for depression. Negative for suicidal ideas.    PHYSICAL EXAM: Pt in no acute distress  RECENT LABS AND TESTS: BMET    Component Value Date/Time   NA 144 11/14/2018 1039   K 4.8 11/14/2018 1039   CL 103 11/14/2018 1039   CO2 24 11/14/2018 1039   GLUCOSE 122 (H) 11/14/2018 1039   GLUCOSE 115 (H) 10/14/2011 0931   BUN 20 11/14/2018 1039   CREATININE 1.09 (H) 11/14/2018 1039   CALCIUM 9.4 11/14/2018 1039   GFRNONAA 50 (L) 11/14/2018 1039   GFRAA 57 (L) 11/14/2018 1039   Lab Results  Component Value Date   HGBA1C 6.9 (H) 11/14/2018   No results found for: INSULIN CBC No results found for: WBC, RBC, HGB, HCT, PLT, MCV, MCH, MCHC, RDW, LYMPHSABS, MONOABS, EOSABS, BASOSABS Iron/TIBC/Ferritin/ %Sat No results found for: IRON, TIBC, FERRITIN, IRONPCTSAT Lipid Panel     Component Value Date/Time   CHOL 138 12/20/2018 0850   TRIG 159 (H) 12/20/2018 0850   HDL 47 12/20/2018 0850   CHOLHDL 2.9 12/20/2018 0850   CHOLHDL 3 10/14/2011 0931   VLDL 21.6 10/14/2011 0931   LDLCALC 59 12/20/2018 0850   Hepatic Function Panel     Component Value Date/Time   PROT 6.7 12/20/2018 0850   ALBUMIN 4.3 12/20/2018 0850   AST 20 12/20/2018 0850   ALT 21 12/20/2018 0850   ALKPHOS 74 12/20/2018 0850   BILITOT 0.3 12/20/2018 0850   BILIDIR 0.11 12/20/2018 0850   No results found for: TSH   Ref. Range 11/14/2018 10:39  Vitamin D, 25-Hydroxy Latest Ref Range: 30.0 - 100.0 ng/mL 79.4    I, Doreene Nest, am acting as Location manager for [employer]. Owens Shark, DO  I have reviewed the above documentation for accuracy and completeness, and I agree with the above. -[name], DO

## 2019-03-26 ENCOUNTER — Telehealth (INDEPENDENT_AMBULATORY_CARE_PROVIDER_SITE_OTHER): Payer: Medicare HMO | Admitting: Bariatrics

## 2019-03-26 ENCOUNTER — Encounter (INDEPENDENT_AMBULATORY_CARE_PROVIDER_SITE_OTHER): Payer: Self-pay | Admitting: Bariatrics

## 2019-03-26 ENCOUNTER — Other Ambulatory Visit: Payer: Self-pay

## 2019-03-26 DIAGNOSIS — R69 Illness, unspecified: Secondary | ICD-10-CM | POA: Diagnosis not present

## 2019-03-26 DIAGNOSIS — Z6834 Body mass index (BMI) 34.0-34.9, adult: Secondary | ICD-10-CM

## 2019-03-26 DIAGNOSIS — E119 Type 2 diabetes mellitus without complications: Secondary | ICD-10-CM

## 2019-03-26 DIAGNOSIS — E669 Obesity, unspecified: Secondary | ICD-10-CM | POA: Diagnosis not present

## 2019-03-26 DIAGNOSIS — F3289 Other specified depressive episodes: Secondary | ICD-10-CM | POA: Diagnosis not present

## 2019-03-26 DIAGNOSIS — Z794 Long term (current) use of insulin: Secondary | ICD-10-CM | POA: Diagnosis not present

## 2019-03-26 MED ORDER — TROKENDI XR 50 MG PO CP24: 50.0000 mg | ORAL_CAPSULE | Freq: Every day | ORAL | 0 refills | Status: DC

## 2019-03-27 NOTE — Progress Notes (Signed)
Office: [phone]  /  Fax: [phone] TeleHealth Visit:  Sandra [name] has verbally consented to this TeleHealth visit today. The patient is located at home, the provider is located at the [facility] office. The participants in this visit include the listed provider and patient. Sandra [name] was unable to use realtime audiovisual technology today and the telehealth visit was conducted via telephone.   HPI:   Chief Complaint: OBESITY Sandra [name] is here to discuss her progress with her obesity treatment plan. She is on the Category 1 plan and is following her eating plan approximately 0 % of the time (unsure). She states she is walking 5,000 steps 7 times per week. Sandra [name] states that her weight is the same. She has cataract surgery next week. She has struggled slightly with her protein, but she is doing well with water. We were unable to weigh the patient today for this TeleHealth visit. She feels as if she has maintained her weight since her last visit. She has lost 15 lbs since starting treatment with [name].  Diabetes II Sandra [name] has a diagnosis of diabetes type II. Sandra [name] states her fasting BGs range at 123 and below 130 all week. She is taking Jardiance, Glargine, and Victoza. She denies hypoglycemia. Last A1c was 6.9. She has been working on intensive lifestyle modifications including diet, exercise, and weight loss to help control her blood glucose levels.  Depression with Emotional Eating Behaviors Sandra [name] is struggling with emotional eating and using food for comfort to the extent that it is negatively impacting her health. She often snacks when she is not hungry. Sandra [name] sometimes feels she is out of control and then feels guilty that she made poor food choices. She has been working on behavior modification techniques to help reduce her emotional eating and has been somewhat successful. She shows no sign of suicidal or homicidal ideations.  Depression screen PHQ 2/9 05/28/2018   Decreased Interest 1  Down, Depressed, Hopeless 1  PHQ - 2 Score 2  Altered sleeping 1  Tired, decreased energy 2  Change in appetite 1  Feeling bad or failure about yourself  1  Trouble concentrating 0  Moving slowly or fidgety/restless 1  Suicidal thoughts 0  PHQ-9 Score 8  Difficult doing work/chores Not difficult at all    ASSESSMENT AND PLAN:  Other depression - with emotional eating - Plan: Topiramate ER (TROKENDI XR) 50 MG CP24  Type 2 diabetes mellitus without complication, with long-term current use of insulin (HCC)  Class 1 obesity with serious comorbidity and body mass index (BMI) of 34.0 to 34.9 in adult, unspecified obesity type  PLAN:  Diabetes II Sandra [name] has been given extensive diabetes education by myself today including ideal fasting and post-prandial blood glucose readings, individual ideal Hgb A1c goals and hypoglycemia prevention. We discussed the importance of good blood sugar control to decrease the likelihood of diabetic complications such as nephropathy, neuropathy, limb loss, blindness, coronary artery disease, and death. We discussed the importance of intensive lifestyle modification including diet, exercise and weight loss as the first line treatment for diabetes. Sandra [name] agrees to continue her diabetes medications and she agrees to follow up with our clinic in 2 weeks.  Depression with Emotional Eating Behaviors We discussed behavior modification techniques today to help Sandra [name] deal with her emotional eating and depression. Sandra [name] agrees to continue taking topiramate ER 50 mg 1 tablet PO daily #30 and we will refill for 1 month. Sandra [name] agrees to follow up with our clinic in  2 weeks.  Obesity Sandra [name] is currently in the action stage of change. As such, her goal is to continue with weight loss efforts She has agreed to follow the Category 1 plan Sandra [name] has been instructed to work up to a goal of 150 minutes of combined cardio and strengthening exercise per  week or continue walking 5,000 steps 7 days per week for weight loss and overall health benefits. We discussed the following Behavioral Modification Strategies today: increasing lean protein intake, decreasing simple carbohydrates, increasing vegetables, decrease eating out, increase H20 intake, work on meal planning and easy cooking plans, no skipping meals, and keeping healthy foods in the home   Sandra [name] has agreed to follow up with our clinic in 2 weeks. She was informed of the importance of frequent follow up visits to maximize her success with intensive lifestyle modifications for her multiple health conditions.  ALLERGIES: Allergies  Allergen Reactions  . Erythromycin   . Epinephrine Palpitations  . Metformin And Related Rash    MEDICATIONS: Current Outpatient Medications on File Prior to Visit  Medication Sig Dispense Refill  . amitriptyline (ELAVIL) 25 MG tablet Take one to three (1-3) tablets (25 mg to 75 mg total) by mouth twice daily as needed for back pain.    Marland Kitchen aspirin EC 81 MG tablet Take 1 tablet (81 mg total) by mouth daily. 90 tablet 3  . BIOTIN PO Take by mouth.    Marland Kitchen CALCIUM PO Take by mouth daily. Reported on 02/24/2016    . cetirizine (ZYRTEC) 10 MG tablet Take 10 mg by mouth daily.    . Cinnamon 500 MG capsule Take 500 mg by mouth daily.    . empagliflozin (JARDIANCE) 25 MG TABS tablet Take 25 mg by mouth daily.    Marland Kitchen ezetimibe (ZETIA) 10 MG tablet Take 1 tablet (10 mg total) by mouth daily. 90 tablet 3  . fluticasone (FLONASE) 50 MCG/ACT nasal spray Place 2 sprays into the nose daily.    Marland Kitchen ibuprofen (ADVIL,MOTRIN) 100 MG tablet Take 100 mg by mouth every 6 (six) hours as needed.      . Insulin Glargine (BASAGLAR KWIKPEN [place]) Inject 16 Units into the skin.     Marland Kitchen liraglutide (VICTOZA) 18 MG/3ML SOPN Inject 1.8 mg into the skin daily.    Marland Kitchen lisinopril (PRINIVIL,ZESTRIL) 2.5 MG tablet Take 2.5 mg by mouth daily.    . metoprolol (TOPROL-XL) 50 MG 24 hr tablet Take 25 mg by  mouth daily.     . Multiple Vitamin (MULTIVITAMIN) tablet Take 1 tablet by mouth daily. Reported on 02/24/2016    . oxybutynin (DITROPAN-XL) 10 MG 24 hr tablet Take 10 mg by mouth at bedtime.    . rosuvastatin (CRESTOR) 10 MG tablet Take 10 mg by mouth once a week.     Marland Kitchen tiZANidine (ZANAFLEX) 4 MG tablet Take 4 mg by mouth every 6 (six) hours as needed for muscle spasms (back pain).    . varenicline (CHANTIX STARTING MONTH PAK) 0.5 MG X 11 & 1 MG X 42 tablet Take one 0.5 mg tablet by mouth once daily for 3 days, then increase to one 0.5 mg tablet twice daily for 4 days, then increase to one 1 mg tablet twice daily. 53 tablet 0  . VITAMIN D, CHOLECALCIFEROL, PO Take 2,000 mg by mouth daily.      No current facility-administered medications on file prior to visit.     PAST MEDICAL HISTORY: Past Medical History:  Diagnosis Date  . Arthritis   .  Back pain   . Chronic kidney disease   . Congenital absence of left kidney   . Congenital absence of left ovary   . Coronary artery disease 2004   STATUS POST STENTING OF THE RIGHT CORONARY OSTIUM   . DDD (degenerative disc disease)   . Diabetes mellitus without complication ([place])   . Dislocated shoulder    left  . DVT (deep venous thrombosis) (HCC)    left posterior tibial vein  . High blood pressure   . Hypercholesterolemia   . Joint pain   . Osteoarthritis   . Peripheral vascular disease ([place])   . Sleep apnea   . Sleep apnea   . SOB (shortness of breath)   . Tobacco dependence   . Vitamin D deficiency     PAST SURGICAL HISTORY: Past Surgical History:  Procedure Laterality Date  . ANGIOPLASTY  2004  . bone spur shoulder    . FOOT SURGERY     plantar fascitis  . KNEE SURGERY  1950  . TOTAL ABDOMINAL HYSTERECTOMY    . TUBAL LIGATION    . VEIN BYPASS SURGERY     2014    SOCIAL HISTORY: Social History   Tobacco Use  . Smoking status: Former Smoker    Packs/day: 1.00    Quit date: 10/27/2015    Years since quitting: 3.4  .  Smokeless tobacco: Never Used  Substance Use Topics  . Alcohol use: No    Alcohol/week: 0.0 standard drinks  . Drug use: No    FAMILY HISTORY: Family History  Problem Relation Age of Onset  . Leukemia Sister   . Hypertension Brother   . Heart disease Father        before age 13  . High blood pressure Father   . Sudden death Father   . Breast cancer Neg Hx     ROS: Review of Systems  Constitutional: Negative for weight loss.  Endo/Heme/Allergies:       Negative hypoglycemia  Psychiatric/Behavioral: Positive for depression. Negative for suicidal ideas.    PHYSICAL EXAM: Pt in no acute distress  RECENT LABS AND TESTS: BMET    Component Value Date/Time   NA 144 11/14/2018 1039   K 4.8 11/14/2018 1039   CL 103 11/14/2018 1039   CO2 24 11/14/2018 1039   GLUCOSE 122 (H) 11/14/2018 1039   GLUCOSE 115 (H) 10/14/2011 0931   BUN 20 11/14/2018 1039   CREATININE 1.09 (H) 11/14/2018 1039   CALCIUM 9.4 11/14/2018 1039   GFRNONAA 50 (L) 11/14/2018 1039   GFRAA 57 (L) 11/14/2018 1039   Lab Results  Component Value Date   HGBA1C 6.9 (H) 11/14/2018   No results found for: INSULIN CBC No results found for: WBC, RBC, HGB, HCT, PLT, MCV, MCH, MCHC, RDW, LYMPHSABS, MONOABS, EOSABS, BASOSABS Iron/TIBC/Ferritin/ %Sat No results found for: IRON, TIBC, FERRITIN, IRONPCTSAT Lipid Panel     Component Value Date/Time   CHOL 138 12/20/2018 0850   TRIG 159 (H) 12/20/2018 0850   HDL 47 12/20/2018 0850   CHOLHDL 2.9 12/20/2018 0850   CHOLHDL 3 10/14/2011 0931   VLDL 21.6 10/14/2011 0931   LDLCALC 59 12/20/2018 0850   Hepatic Function Panel     Component Value Date/Time   PROT 6.7 12/20/2018 0850   ALBUMIN 4.3 12/20/2018 0850   AST 20 12/20/2018 0850   ALT 21 12/20/2018 0850   ALKPHOS 74 12/20/2018 0850   BILITOT 0.3 12/20/2018 0850   BILIDIR 0.11 12/20/2018 0850  No results found for: TSH    I, Trixie Dredge, am acting as transcriptionist for [employer], DO  I have  reviewed the above documentation for accuracy and completeness, and I agree with the above. -[name], DO

## 2019-04-02 ENCOUNTER — Telehealth (INDEPENDENT_AMBULATORY_CARE_PROVIDER_SITE_OTHER): Payer: Medicare HMO | Admitting: Bariatrics

## 2019-04-04 DIAGNOSIS — H25011 Cortical age-related cataract, right eye: Secondary | ICD-10-CM | POA: Diagnosis not present

## 2019-04-04 DIAGNOSIS — H25811 Combined forms of age-related cataract, right eye: Secondary | ICD-10-CM | POA: Diagnosis not present

## 2019-04-04 DIAGNOSIS — H2511 Age-related nuclear cataract, right eye: Secondary | ICD-10-CM | POA: Diagnosis not present

## 2019-04-09 ENCOUNTER — Encounter (INDEPENDENT_AMBULATORY_CARE_PROVIDER_SITE_OTHER): Payer: Self-pay | Admitting: Bariatrics

## 2019-04-09 ENCOUNTER — Other Ambulatory Visit: Payer: Self-pay

## 2019-04-09 ENCOUNTER — Telehealth (INDEPENDENT_AMBULATORY_CARE_PROVIDER_SITE_OTHER): Payer: Medicare HMO | Admitting: Bariatrics

## 2019-04-09 DIAGNOSIS — E669 Obesity, unspecified: Secondary | ICD-10-CM

## 2019-04-09 DIAGNOSIS — E119 Type 2 diabetes mellitus without complications: Secondary | ICD-10-CM

## 2019-04-09 DIAGNOSIS — R69 Illness, unspecified: Secondary | ICD-10-CM | POA: Diagnosis not present

## 2019-04-09 DIAGNOSIS — Z794 Long term (current) use of insulin: Secondary | ICD-10-CM

## 2019-04-09 DIAGNOSIS — F3289 Other specified depressive episodes: Secondary | ICD-10-CM

## 2019-04-09 DIAGNOSIS — Z6834 Body mass index (BMI) 34.0-34.9, adult: Secondary | ICD-10-CM

## 2019-04-09 NOTE — Progress Notes (Signed)
Office: [phone]  /  Fax: [phone] TeleHealth Visit:  Sandra [name] has verbally consented to this TeleHealth visit today. The patient is located at home, the provider is located at the [facility] office. The participants in this visit include the listed provider and patient. The visit was conducted today via telephone call.  HPI:   Chief Complaint: OBESITY Sandra [name] is here to discuss her progress with her obesity treatment plan. She is on the Category 1 plan and is following her eating plan approximately 70% of the time. She states she is walking 20 minutes 7 times per week. Sandra [name] states that she has gained 2 lbs (weight 165). She states that her cataract surgery went well and reports she has been under more stress. We were unable to weigh the patient today for this TeleHealth visit. She feels as if she has gained 2 lbs since her last visit. She has lost 15 lbs since starting treatment with [name].  Depression with emotional eating behaviors [name] is struggling with emotional eating and using food for comfort to the extent that it is negatively impacting her health. She often snacks when she is not hungry. Sandra [name] sometimes feels she is out of control and then feels guilty that she made poor food choices. She has been working on behavior modification techniques to help reduce her emotional eating and has been somewhat successful. Sandra [name] is taking Trokendi and shows no sign of suicidal or homicidal ideations.  Depression screen PHQ 2/9 05/28/2018  Decreased Interest 1  Down, Depressed, Hopeless 1  PHQ - 2 Score 2  Altered sleeping 1  Tired, decreased energy 2  Change in appetite 1  Feeling bad or failure about yourself  1  Trouble concentrating 0  Moving slowly or fidgety/restless 1  Suicidal thoughts 0  PHQ-9 Score 8  Difficult doing work/chores Not difficult at all   Diabetes II Sandra [name] has a diagnosis of diabetes type II and is taking Scientist, product/process development.  [name] states blood sugar was 133 this morning and reports blood sugars average between 120 and 140. Last A1c was 6.9 on 11/14/2018. She has been working on intensive lifestyle modifications including diet, exercise, and weight loss to help control her blood glucose levels.  ASSESSMENT AND PLAN:  Other depression  Type 2 diabetes mellitus without complication, with long-term current use of insulin (HCC)  Class 1 obesity with serious comorbidity and body mass index (BMI) of 34.0 to 34.9 in adult, unspecified obesity type  PLAN:  Depression with Emotional Eating Behaviors We discussed behavior modification techniques today to help Aliea deal with her emotional eating and depression. Sandra [name] will continue taking Trokendi and follow-up with [name] as directed to monitor her progress.  Diabetes II Sandra [name] has been given extensive diabetes education by myself today including ideal fasting and post-prandial blood glucose readings, individual ideal HgA1c goals  and hypoglycemia prevention. We discussed the importance of good blood sugar control to decrease the likelihood of diabetic complications such as nephropathy, neuropathy, limb loss, blindness, coronary artery disease, and death. We discussed the importance of intensive lifestyle modification including diet, exercise and weight loss as the first line treatment for diabetes. Sandra [name] agrees to continue her diabetes medications and will follow-up at the agreed upon time.  Obesity Sandra [name] is currently in the action stage of change. As such, her goal is to continue with weight loss efforts. She has agreed to follow the Category 1 plan. Sandra [name] will work on meal planning and intentional eating. Sandra [name]  has been instructed to work up to a goal of 150 minutes of combined cardio and strengthening exercise per week for weight loss and overall health benefits. We discussed the following Behavioral Modification Strategies today: increasing lean protein intake,  decreasing simple carbohydrates, increasing vegetables, increase H20 intake, decrease eating out, no skipping meals, work on meal planning and easy cooking plans, keeping healthy foods in the home.   Sandra [name] has agreed to follow-up with our clinic in 2 weeks. She was informed of the importance of frequent follow-up visits to maximize her success with intensive lifestyle modifications for her multiple health conditions.  ALLERGIES: Allergies  Allergen Reactions  . Erythromycin   . Epinephrine Palpitations  . Metformin And Related Rash    MEDICATIONS: Current Outpatient Medications on File Prior to Visit  Medication Sig Dispense Refill  . amitriptyline (ELAVIL) 25 MG tablet Take one to three (1-3) tablets (25 mg to 75 mg total) by mouth twice daily as needed for back pain.    Marland Kitchen aspirin EC 81 MG tablet Take 1 tablet (81 mg total) by mouth daily. 90 tablet 3  . BIOTIN PO Take by mouth.    Marland Kitchen CALCIUM PO Take by mouth daily. Reported on 02/24/2016    . cetirizine (ZYRTEC) 10 MG tablet Take 10 mg by mouth daily.    . Cinnamon 500 MG capsule Take 500 mg by mouth daily.    . empagliflozin (JARDIANCE) 25 MG TABS tablet Take 25 mg by mouth daily.    Marland Kitchen ezetimibe (ZETIA) 10 MG tablet Take 1 tablet (10 mg total) by mouth daily. 90 tablet 3  . fluticasone (FLONASE) 50 MCG/ACT nasal spray Place 2 sprays into the [name] daily.    Marland Kitchen ibuprofen (ADVIL,MOTRIN) 100 MG tablet Take 100 mg by mouth every 6 (six) hours as needed.      . Insulin Glargine (BASAGLAR KWIKPEN [place]) Inject 16 Units into the skin.     Marland Kitchen liraglutide (VICTOZA) 18 MG/3ML SOPN Inject 1.8 mg into the skin daily.    Marland Kitchen lisinopril (PRINIVIL,ZESTRIL) 2.5 MG tablet Take 2.5 mg by mouth daily.    . metoprolol (TOPROL-XL) 50 MG 24 hr tablet Take 25 mg by mouth daily.     . Multiple Vitamin (MULTIVITAMIN) tablet Take 1 tablet by mouth daily. Reported on 02/24/2016    . oxybutynin (DITROPAN-XL) 10 MG 24 hr tablet Take 10 mg by mouth at bedtime.    .  rosuvastatin (CRESTOR) 10 MG tablet Take 10 mg by mouth once a week.     Marland Kitchen tiZANidine (ZANAFLEX) 4 MG tablet Take 4 mg by mouth every 6 (six) hours as needed for muscle spasms (back pain).    . Topiramate ER (TROKENDI XR) 50 MG CP24 Take 50 mg by mouth daily. 30 capsule 0  . varenicline (CHANTIX STARTING MONTH PAK) 0.5 MG X 11 & 1 MG X 42 tablet Take one 0.5 mg tablet by mouth once daily for 3 days, then increase to one 0.5 mg tablet twice daily for 4 days, then increase to one 1 mg tablet twice daily. 53 tablet 0  . VITAMIN D, CHOLECALCIFEROL, PO Take 2,000 mg by mouth daily.      No current facility-administered medications on file prior to visit.     PAST MEDICAL HISTORY: Past Medical History:  Diagnosis Date  . Arthritis   . Back pain   . Chronic kidney disease   . Congenital absence of left kidney   . Congenital absence of left ovary   .  Coronary artery disease 2004   STATUS POST STENTING OF THE RIGHT CORONARY OSTIUM   . DDD (degenerative disc disease)   . Diabetes mellitus without complication ([place])   . Dislocated shoulder    left  . DVT (deep venous thrombosis) (HCC)    left posterior tibial vein  . High blood pressure   . Hypercholesterolemia   . Joint pain   . Osteoarthritis   . Peripheral vascular disease ([place])   . Sleep apnea   . Sleep apnea   . SOB (shortness of breath)   . Tobacco dependence   . Vitamin D deficiency     PAST SURGICAL HISTORY: Past Surgical History:  Procedure Laterality Date  . ANGIOPLASTY  2004  . bone spur shoulder    . FOOT SURGERY     plantar fascitis  . KNEE SURGERY  1950  . TOTAL ABDOMINAL HYSTERECTOMY    . TUBAL LIGATION    . VEIN BYPASS SURGERY     2014    SOCIAL HISTORY: Social History   Tobacco Use  . Smoking status: Former Smoker    Packs/day: 1.00    Quit date: 10/27/2015    Years since quitting: 3.4  . Smokeless tobacco: Never Used  Substance Use Topics  . Alcohol use: No    Alcohol/week: 0.0 standard drinks  .  Drug use: No    FAMILY HISTORY: Family History  Problem Relation Age of Onset  . Leukemia Sister   . Hypertension Brother   . Heart disease Father        before age 25  . High blood pressure Father   . Sudden death Father   . Breast cancer Neg Hx    ROS: Review of Systems  Psychiatric/Behavioral: Positive for depression (emotional eating). Negative for suicidal ideas.       Negative for homicidal ideas.   PHYSICAL EXAM: Pt in no acute distress  RECENT LABS AND TESTS: BMET    Component Value Date/Time   NA 144 11/14/2018 1039   K 4.8 11/14/2018 1039   CL 103 11/14/2018 1039   CO2 24 11/14/2018 1039   GLUCOSE 122 (H) 11/14/2018 1039   GLUCOSE 115 (H) 10/14/2011 0931   BUN 20 11/14/2018 1039   CREATININE 1.09 (H) 11/14/2018 1039   CALCIUM 9.4 11/14/2018 1039   GFRNONAA 50 (L) 11/14/2018 1039   GFRAA 57 (L) 11/14/2018 1039   Lab Results  Component Value Date   HGBA1C 6.9 (H) 11/14/2018   No results found for: INSULIN CBC No results found for: WBC, RBC, HGB, HCT, PLT, MCV, MCH, MCHC, RDW, LYMPHSABS, MONOABS, EOSABS, BASOSABS Iron/TIBC/Ferritin/ %Sat No results found for: IRON, TIBC, FERRITIN, IRONPCTSAT Lipid Panel     Component Value Date/Time   CHOL 138 12/20/2018 0850   TRIG 159 (H) 12/20/2018 0850   HDL 47 12/20/2018 0850   CHOLHDL 2.9 12/20/2018 0850   CHOLHDL 3 10/14/2011 0931   VLDL 21.6 10/14/2011 0931   LDLCALC 59 12/20/2018 0850   Hepatic Function Panel     Component Value Date/Time   PROT 6.7 12/20/2018 0850   ALBUMIN 4.3 12/20/2018 0850   AST 20 12/20/2018 0850   ALT 21 12/20/2018 0850   ALKPHOS 74 12/20/2018 0850   BILITOT 0.3 12/20/2018 0850   BILIDIR 0.11 12/20/2018 0850   No results found for: TSH  Results for Buccellato, Haruko H "JUDY" (MRN [id number]) as of 04/09/2019 14:25  Ref. Range 11/14/2018 10:39  Vitamin D, 25-Hydroxy Latest Ref Range: 30.0 - 100.0  ng/mL 79.4   I, Michaelene Song, am acting as Location manager for [employer], DO  I  have reviewed the above documentation for accuracy and completeness, and I agree with the above. -[name], DO

## 2019-04-18 DIAGNOSIS — H25012 Cortical age-related cataract, left eye: Secondary | ICD-10-CM | POA: Diagnosis not present

## 2019-04-18 DIAGNOSIS — H25812 Combined forms of age-related cataract, left eye: Secondary | ICD-10-CM | POA: Diagnosis not present

## 2019-04-18 DIAGNOSIS — H2512 Age-related nuclear cataract, left eye: Secondary | ICD-10-CM | POA: Diagnosis not present

## 2019-04-23 ENCOUNTER — Telehealth (INDEPENDENT_AMBULATORY_CARE_PROVIDER_SITE_OTHER): Payer: Medicare HMO | Admitting: Bariatrics

## 2019-04-23 ENCOUNTER — Other Ambulatory Visit: Payer: Self-pay

## 2019-04-23 ENCOUNTER — Encounter (INDEPENDENT_AMBULATORY_CARE_PROVIDER_SITE_OTHER): Payer: Self-pay | Admitting: Bariatrics

## 2019-04-23 DIAGNOSIS — F3289 Other specified depressive episodes: Secondary | ICD-10-CM

## 2019-04-23 DIAGNOSIS — E669 Obesity, unspecified: Secondary | ICD-10-CM | POA: Diagnosis not present

## 2019-04-23 DIAGNOSIS — E119 Type 2 diabetes mellitus without complications: Secondary | ICD-10-CM

## 2019-04-23 DIAGNOSIS — Z6831 Body mass index (BMI) 31.0-31.9, adult: Secondary | ICD-10-CM | POA: Diagnosis not present

## 2019-04-23 DIAGNOSIS — R69 Illness, unspecified: Secondary | ICD-10-CM | POA: Diagnosis not present

## 2019-04-23 MED ORDER — TROKENDI XR 50 MG PO CP24: 50.0000 mg | ORAL_CAPSULE | Freq: Every day | ORAL | 0 refills | Status: DC

## 2019-04-24 NOTE — Progress Notes (Signed)
Office: [phone]  /  Fax: [phone] TeleHealth Visit:  Sandra [name] has verbally consented to this TeleHealth visit today. The patient is located at home, the provider is located at the [facility] office. The participants in this visit include the listed provider and patient and any and all parties involved. The visit was conducted today via telephone. Sandra [name] was unable to use realtime audiovisual technology today and the telehealth visit was conducted via telephone.  HPI:   Chief Complaint: OBESITY Sandra [name] is here to discuss her progress with her obesity treatment plan. She is on the Category 1 plan and is following her eating plan approximately 50 % of the time. She states she is walking 7,000 to 8,000 steps 6 times per week. Sandra [name] states that she may have gained a couple of pounds (weight not reported), but she has had cataract surgery since her last visit. We were unable to weigh the patient today for this TeleHealth visit. She feels as if she has gained weight since her last visit. She has lost 15 lbs since starting treatment with [name].  Diabetes II Sandra [name] has a diagnosis of diabetes type II. She is taking Jardiance, Basaglar, Insulin and Victoza. Sandra [name] states fasting BGs range between 120 and 130 and she denies any hypoglycemic episodes. Last A1c was at 6.9 She has been working on intensive lifestyle modifications including diet, exercise, and weight loss to help control her blood glucose levels.  Depression with emotional eating behaviors Sandra [name] is struggling with emotional eating and using food for comfort to the extent that it is negatively impacting her health. She often snacks when she is not hungry. Sandra [name] sometimes feels she is out of control and then feels guilty that she made poor food choices. Sandra [name] is taking Trokendi. She has been working on behavior modification techniques to help reduce her emotional eating and has been somewhat successful. She shows no  sign of suicidal or homicidal ideations.  ASSESSMENT AND PLAN:  Type 2 diabetes mellitus without complication, without long-term current use of insulin (HCC)  Other depression - Plan: Topiramate ER (TROKENDI XR) 50 MG CP24  Class 1 obesity with serious comorbidity and body mass index (BMI) of 31.0 to 31.9 in adult, unspecified obesity type  Other depression - with emotional eating - Plan: Topiramate ER (TROKENDI XR) 50 MG CP24  PLAN:  Diabetes II Sandra [name] has been given extensive diabetes education by myself today including ideal fasting and post-prandial blood glucose readings, individual ideal Hgb A1c goals and hypoglycemia prevention. We discussed the importance of good blood sugar control to decrease the likelihood of diabetic complications such as nephropathy, neuropathy, limb loss, blindness, coronary artery disease, and death. We discussed the importance of intensive lifestyle modification including diet, exercise and weight loss as the first line treatment for diabetes. Luretta agrees to continue her diabetes medications and she will continue to monitor her fasting blood sugars and 2 hour post prandial blood sugars. Sandra [name] will follow up at the agreed upon time.  Depression with Emotional Eating Behaviors We discussed behavior modification techniques today to help Sandra [name] deal with her emotional eating and depression. She has agreed to take Trokendi XR 50 mg once daily #30 with no refills and follow up as directed.  Obesity Sandra [name] is currently in the action stage of change. As such, her goal is to continue with weight loss efforts She has agreed to follow the Category 1 plan Sandra [name] will continue her exercise regimen for weight loss and overall health  benefits. We discussed the following Behavioral Modification Strategies today: planning for success, increase H2O intake, no skipping meals, keeping healthy foods in the home, increasing lean protein intake, decreasing simple carbohydrates,  increasing vegetables, decrease eating out and work on meal planning and intentional eating Sandra [name] will get back on track with the Category 1 plan.  Sandra [name] has agreed to follow up with our clinic in 2 weeks. She was informed of the importance of frequent follow up visits to maximize her success with intensive lifestyle modifications for her multiple health conditions.  ALLERGIES: Allergies  Allergen Reactions   Erythromycin    Epinephrine Palpitations   Metformin And Related Rash    MEDICATIONS: Current Outpatient Medications on File Prior to Visit  Medication Sig Dispense Refill   amitriptyline (ELAVIL) 25 MG tablet Take one to three (1-3) tablets (25 mg to 75 mg total) by mouth twice daily as needed for back pain.     aspirin EC 81 MG tablet Take 1 tablet (81 mg total) by mouth daily. 90 tablet 3   BIOTIN PO Take by mouth.     CALCIUM PO Take by mouth daily. Reported on 02/24/2016     cetirizine (ZYRTEC) 10 MG tablet Take 10 mg by mouth daily.     Cinnamon 500 MG capsule Take 500 mg by mouth daily.     empagliflozin (JARDIANCE) 25 MG TABS tablet Take 25 mg by mouth daily.     ezetimibe (ZETIA) 10 MG tablet Take 1 tablet (10 mg total) by mouth daily. 90 tablet 3   fluticasone (FLONASE) 50 MCG/ACT nasal spray Place 2 sprays into the nose daily.     ibuprofen (ADVIL,MOTRIN) 100 MG tablet Take 100 mg by mouth every 6 (six) hours as needed.       Insulin Glargine (BASAGLAR KWIKPEN [place]) Inject 16 Units into the skin.      liraglutide (VICTOZA) 18 MG/3ML SOPN Inject 1.8 mg into the skin daily.     lisinopril (PRINIVIL,ZESTRIL) 2.5 MG tablet Take 2.5 mg by mouth daily.     metoprolol (TOPROL-XL) 50 MG 24 hr tablet Take 25 mg by mouth daily.      Multiple Vitamin (MULTIVITAMIN) tablet Take 1 tablet by mouth daily. Reported on 02/24/2016     oxybutynin (DITROPAN-XL) 10 MG 24 hr tablet Take 10 mg by mouth at bedtime.     rosuvastatin (CRESTOR) 10 MG tablet Take 10 mg by mouth  once a week.      tiZANidine (ZANAFLEX) 4 MG tablet Take 4 mg by mouth every 6 (six) hours as needed for muscle spasms (back pain).     varenicline (CHANTIX STARTING MONTH PAK) 0.5 MG X 11 & 1 MG X 42 tablet Take one 0.5 mg tablet by mouth once daily for 3 days, then increase to one 0.5 mg tablet twice daily for 4 days, then increase to one 1 mg tablet twice daily. 53 tablet 0   VITAMIN D, CHOLECALCIFEROL, PO Take 2,000 mg by mouth daily.      No current facility-administered medications on file prior to visit.     PAST MEDICAL HISTORY: Past Medical History:  Diagnosis Date   Arthritis    Back pain    Chronic kidney disease    Congenital absence of left kidney    Congenital absence of left ovary    Coronary artery disease 2004   STATUS POST STENTING OF THE RIGHT CORONARY OSTIUM    DDD (degenerative disc disease)    Diabetes mellitus without  complication ([place])    Dislocated shoulder    left   DVT (deep venous thrombosis) (HCC)    left posterior tibial vein   High blood pressure    Hypercholesterolemia    Joint pain    Osteoarthritis    Peripheral vascular disease (HCC)    Sleep apnea    Sleep apnea    SOB (shortness of breath)    Tobacco dependence    Vitamin D deficiency     PAST SURGICAL HISTORY: Past Surgical History:  Procedure Laterality Date   ANGIOPLASTY  2004   bone spur shoulder     FOOT SURGERY     plantar fascitis   KNEE SURGERY  1950   TOTAL ABDOMINAL HYSTERECTOMY     TUBAL LIGATION     VEIN BYPASS SURGERY     2014    SOCIAL HISTORY: Social History   Tobacco Use   Smoking status: Former Smoker    Packs/day: 1.00    Quit date: 10/27/2015    Years since quitting: 3.4   Smokeless tobacco: Never Used  Substance Use Topics   Alcohol use: No    Alcohol/week: 0.0 standard drinks   Drug use: No    FAMILY HISTORY: Family History  Problem Relation Age of Onset   Leukemia Sister    Hypertension Brother    Heart  disease Father        before age 51   High blood pressure Father    Sudden death Father    Breast cancer Neg Hx     ROS: Review of Systems  Constitutional: Negative for weight loss.  Endo/Heme/Allergies:       Negative for hypoglycemia  Psychiatric/Behavioral: Positive for depression. Negative for suicidal ideas.    PHYSICAL EXAM: Pt in no acute distress  RECENT LABS AND TESTS: BMET    Component Value Date/Time   NA 144 11/14/2018 1039   K 4.8 11/14/2018 1039   CL 103 11/14/2018 1039   CO2 24 11/14/2018 1039   GLUCOSE 122 (H) 11/14/2018 1039   GLUCOSE 115 (H) 10/14/2011 0931   BUN 20 11/14/2018 1039   CREATININE 1.09 (H) 11/14/2018 1039   CALCIUM 9.4 11/14/2018 1039   GFRNONAA 50 (L) 11/14/2018 1039   GFRAA 57 (L) 11/14/2018 1039   Lab Results  Component Value Date   HGBA1C 6.9 (H) 11/14/2018   No results found for: INSULIN CBC No results found for: WBC, RBC, HGB, HCT, PLT, MCV, MCH, MCHC, RDW, LYMPHSABS, MONOABS, EOSABS, BASOSABS Iron/TIBC/Ferritin/ %Sat No results found for: IRON, TIBC, FERRITIN, IRONPCTSAT Lipid Panel     Component Value Date/Time   CHOL 138 12/20/2018 0850   TRIG 159 (H) 12/20/2018 0850   HDL 47 12/20/2018 0850   CHOLHDL 2.9 12/20/2018 0850   CHOLHDL 3 10/14/2011 0931   VLDL 21.6 10/14/2011 0931   LDLCALC 59 12/20/2018 0850   Hepatic Function Panel     Component Value Date/Time   PROT 6.7 12/20/2018 0850   ALBUMIN 4.3 12/20/2018 0850   AST 20 12/20/2018 0850   ALT 21 12/20/2018 0850   ALKPHOS 74 12/20/2018 0850   BILITOT 0.3 12/20/2018 0850   BILIDIR 0.11 12/20/2018 0850   No results found for: TSH    Ref. Range 11/14/2018 10:39  Vitamin D, 25-Hydroxy Latest Ref Range: 30.0 - 100.0 ng/mL 79.4    I, Doreene Nest, am acting as Location manager for [employer]. Owens Shark, DO  I have reviewed the above documentation for accuracy and completeness, and I agree  with the above. -[name], DO

## 2019-05-07 ENCOUNTER — Ambulatory Visit (INDEPENDENT_AMBULATORY_CARE_PROVIDER_SITE_OTHER): Payer: Medicare HMO | Admitting: Bariatrics

## 2019-05-07 ENCOUNTER — Encounter (INDEPENDENT_AMBULATORY_CARE_PROVIDER_SITE_OTHER): Payer: Self-pay | Admitting: Bariatrics

## 2019-05-07 ENCOUNTER — Other Ambulatory Visit: Payer: Self-pay

## 2019-05-07 VITALS — BP 113/72 | HR 78 | Temp 97.7°F | Ht 61.0 in | Wt 171.0 lb

## 2019-05-07 DIAGNOSIS — E669 Obesity, unspecified: Secondary | ICD-10-CM | POA: Diagnosis not present

## 2019-05-07 DIAGNOSIS — Z6832 Body mass index (BMI) 32.0-32.9, adult: Secondary | ICD-10-CM

## 2019-05-07 DIAGNOSIS — F3289 Other specified depressive episodes: Secondary | ICD-10-CM

## 2019-05-07 DIAGNOSIS — R69 Illness, unspecified: Secondary | ICD-10-CM | POA: Diagnosis not present

## 2019-05-07 DIAGNOSIS — E119 Type 2 diabetes mellitus without complications: Secondary | ICD-10-CM

## 2019-05-07 DIAGNOSIS — Z794 Long term (current) use of insulin: Secondary | ICD-10-CM | POA: Diagnosis not present

## 2019-05-07 NOTE — Progress Notes (Signed)
Office: [phone]  /  Fax: [phone]   HPI:   Chief Complaint: OBESITY Sandra [name] is here to discuss her progress with her obesity treatment plan. She is on the Category 1 plan and is following her eating plan approximately 50% of the time. She states she is exercising 0 minutes 0 times per week. Sandra [name] is up 6 lbs. She has been to the beach and eating more overall. She is up 2 lbs in water weight. Her weight is 171 lb (77.6 kg) today and has had a weight gain of 6 lbs since her last visit. She has lost 9 lbs since starting treatment with [name].  Diabetes II Sandra [name] has a diagnosis of diabetes type II and is taking Victoza and Jardiance. Sandra [name] states fasting blood sugars range between 120 and 130's. Last A1c was 6.9 on 11/14/2018. She has been working on intensive lifestyle modifications including diet, exercise, and weight loss to help control her blood glucose levels.  Depression with emotional eating behaviors Sandra [name] is struggling with emotional eating and using food for comfort to the extent that it is negatively impacting her health. She often snacks when she is not hungry. Sandra [name] sometimes feels she is out of control and then feels guilty that she made poor food choices. She has been working on behavior modification techniques to help reduce her emotional eating and has been somewhat successful. Sandra [name] is taking Trokendi, which is helping with cravings. She shows no sign of suicidal or homicidal ideations.  Depression screen PHQ 2/9 05/28/2018  Decreased Interest 1  Down, Depressed, Hopeless 1  PHQ - 2 Score 2  Altered sleeping 1  Tired, decreased energy 2  Change in appetite 1  Feeling bad or failure about yourself  1  Trouble concentrating 0  Moving slowly or fidgety/restless 1  Suicidal thoughts 0  PHQ-9 Score 8  Difficult doing work/chores Not difficult at all   ASSESSMENT AND PLAN:  Type 2 diabetes mellitus without complication, with long-term current use of insulin  (HCC)  Other depression - with emotional eating   Class 1 obesity with serious comorbidity and body mass index (BMI) of 32.0 to 32.9 in adult, unspecified obesity type  PLAN:  Diabetes II Sandra [name] has been given extensive diabetes education by myself today including ideal fasting and post-prandial blood glucose readings, individual ideal HgA1c goals  and hypoglycemia prevention. We discussed the importance of good blood sugar control to decrease the likelihood of diabetic complications such as nephropathy, neuropathy, limb loss, blindness, coronary artery disease, and death. We discussed the importance of intensive lifestyle modification including diet, exercise and weight loss as the first line treatment for diabetes. Sandra [name] agrees to continue her diabetes medications and will follow-up at the agreed upon time.  Depression with Emotional Eating Behaviors We discussed behavior modification techniques today to help Sandra [name] deal with her emotional eating and depression. Sandra [name] will continue Trokendi and will follow-up as directed to monitor her progress.  Obesity Sandra [name] is currently in the action stage of change. As such, her goal is to continue with weight loss efforts. She has agreed to follow the Category 1 plan. Sandra [name] will work on meal planning, intentional eating, and will get back on plan. Sandra [name] has been instructed to resume walking (back to 8,000 steps using Fit Bit) for weight loss and overall health benefits. We discussed the following Behavioral Modification Strategies today: increasing lean protein intake, decreasing simple carbohydrates, increasing vegetables, increase H20 intake, decrease eating out, no skipping meals, work on meal planning  and easy cooking plans, keeping healthy foods in the home, and planning for success.  Sandra [name] has agreed to follow-up with our clinic in 2 weeks fasting. She was informed of the importance of frequent follow-up visits to maximize her success with  intensive lifestyle modifications for her multiple health conditions.  ALLERGIES: Allergies  Allergen Reactions   Erythromycin    Epinephrine Palpitations   Metformin And Related Rash    MEDICATIONS: Current Outpatient Medications on File Prior to Visit  Medication Sig Dispense Refill   amitriptyline (ELAVIL) 25 MG tablet Take one to three (1-3) tablets (25 mg to 75 mg total) by mouth twice daily as needed for back pain.     aspirin EC 81 MG tablet Take 1 tablet (81 mg total) by mouth daily. 90 tablet 3   BIOTIN PO Take by mouth.     CALCIUM PO Take by mouth daily. Reported on 02/24/2016     cetirizine (ZYRTEC) 10 MG tablet Take 10 mg by mouth daily.     Cinnamon 500 MG capsule Take 500 mg by mouth daily.     empagliflozin (JARDIANCE) 25 MG TABS tablet Take 25 mg by mouth daily.     ezetimibe (ZETIA) 10 MG tablet Take 1 tablet (10 mg total) by mouth daily. 90 tablet 3   fluticasone (FLONASE) 50 MCG/ACT nasal spray Place 2 sprays into the nose daily.     ibuprofen (ADVIL,MOTRIN) 100 MG tablet Take 100 mg by mouth every 6 (six) hours as needed.       Insulin Glargine (BASAGLAR KWIKPEN [place]) Inject 16 Units into the skin.      liraglutide (VICTOZA) 18 MG/3ML SOPN Inject 1.8 mg into the skin daily.     lisinopril (PRINIVIL,ZESTRIL) 2.5 MG tablet Take 2.5 mg by mouth daily.     metoprolol (TOPROL-XL) 50 MG 24 hr tablet Take 25 mg by mouth daily.      Multiple Vitamin (MULTIVITAMIN) tablet Take 1 tablet by mouth daily. Reported on 02/24/2016     oxybutynin (DITROPAN-XL) 10 MG 24 hr tablet Take 10 mg by mouth at bedtime.     rosuvastatin (CRESTOR) 10 MG tablet Take 10 mg by mouth once a week.      tiZANidine (ZANAFLEX) 4 MG tablet Take 4 mg by mouth every 6 (six) hours as needed for muscle spasms (back pain).     Topiramate ER (TROKENDI XR) 50 MG CP24 Take 50 mg by mouth daily. 30 capsule 0   varenicline (CHANTIX STARTING MONTH PAK) 0.5 MG X 11 & 1 MG X 42 tablet Take one  0.5 mg tablet by mouth once daily for 3 days, then increase to one 0.5 mg tablet twice daily for 4 days, then increase to one 1 mg tablet twice daily. 53 tablet 0   VITAMIN D, CHOLECALCIFEROL, PO Take 2,000 mg by mouth daily.      No current facility-administered medications on file prior to visit.     PAST MEDICAL HISTORY: Past Medical History:  Diagnosis Date   Arthritis    Back pain    Chronic kidney disease    Congenital absence of left kidney    Congenital absence of left ovary    Coronary artery disease 2004   STATUS POST STENTING OF THE RIGHT CORONARY OSTIUM    DDD (degenerative disc disease)    Diabetes mellitus without complication (HCC)    Dislocated shoulder    left   DVT (deep venous thrombosis) (HCC)    left posterior tibial  vein   High blood pressure    Hypercholesterolemia    Joint pain    Osteoarthritis    Peripheral vascular disease (HCC)    Sleep apnea    Sleep apnea    SOB (shortness of breath)    Tobacco dependence    Vitamin D deficiency     PAST SURGICAL HISTORY: Past Surgical History:  Procedure Laterality Date   ANGIOPLASTY  2004   bone spur shoulder     FOOT SURGERY     plantar fascitis   KNEE SURGERY  1950   TOTAL ABDOMINAL HYSTERECTOMY     TUBAL LIGATION     VEIN BYPASS SURGERY     2014    SOCIAL HISTORY: Social History   Tobacco Use   Smoking status: Former Smoker    Packs/day: 1.00    Quit date: 10/27/2015    Years since quitting: 3.5   Smokeless tobacco: Never Used  Substance Use Topics   Alcohol use: No    Alcohol/week: 0.0 standard drinks   Drug use: No    FAMILY HISTORY: Family History  Problem Relation Age of Onset   Leukemia Sister    Hypertension Brother    Heart disease Father        before age 69   High blood pressure Father    Sudden death Father    Breast cancer Neg Hx    ROS: Review of Systems  Psychiatric/Behavioral: Positive for depression (emotional eating).  Negative for suicidal ideas.       Negative for homicidal ideas.   PHYSICAL EXAM: Blood pressure 113/72, pulse 78, temperature 97.7 F (36.5 C), temperature source Oral, height 5\' 1"  (1.549 m), weight 171 lb (77.6 kg), SpO2 98 %. Body mass index is 32.31 kg/m. Physical Exam Vitals signs reviewed.  Constitutional:      Appearance: Normal appearance. She is obese.  Cardiovascular:     Rate and Rhythm: Normal rate.     Pulses: Normal pulses.  Pulmonary:     Effort: Pulmonary effort is normal.     Breath sounds: Normal breath sounds.  Musculoskeletal: Normal range of motion.  Skin:    General: Skin is warm and dry.  Neurological:     Mental Status: She is alert and oriented to person, place, and time.  Psychiatric:        Behavior: Behavior normal.   RECENT LABS AND TESTS: BMET    Component Value Date/Time   NA 144 11/14/2018 1039   K 4.8 11/14/2018 1039   CL 103 11/14/2018 1039   CO2 24 11/14/2018 1039   GLUCOSE 122 (H) 11/14/2018 1039   GLUCOSE 115 (H) 10/14/2011 0931   BUN 20 11/14/2018 1039   CREATININE 1.09 (H) 11/14/2018 1039   CALCIUM 9.4 11/14/2018 1039   GFRNONAA 50 (L) 11/14/2018 1039   GFRAA 57 (L) 11/14/2018 1039   Lab Results  Component Value Date   HGBA1C 6.9 (H) 11/14/2018   No results found for: INSULIN CBC No results found for: WBC, RBC, HGB, HCT, PLT, MCV, MCH, MCHC, RDW, LYMPHSABS, MONOABS, EOSABS, BASOSABS Iron/TIBC/Ferritin/ %Sat No results found for: IRON, TIBC, FERRITIN, IRONPCTSAT Lipid Panel     Component Value Date/Time   CHOL 138 12/20/2018 0850   TRIG 159 (H) 12/20/2018 0850   HDL 47 12/20/2018 0850   CHOLHDL 2.9 12/20/2018 0850   CHOLHDL 3 10/14/2011 0931   VLDL 21.6 10/14/2011 0931   LDLCALC 59 12/20/2018 0850   Hepatic Function Panel  Component Value Date/Time   PROT 6.7 12/20/2018 0850   ALBUMIN 4.3 12/20/2018 0850   AST 20 12/20/2018 0850   ALT 21 12/20/2018 0850   ALKPHOS 74 12/20/2018 0850   BILITOT 0.3 12/20/2018  0850   BILIDIR 0.11 12/20/2018 0850   No results found for: TSH  Results for Darwin, Reginna H "JUDY" (MRN [id number]) as of 05/07/2019 14:13  Ref. Range 11/14/2018 10:39  Vitamin D, 25-Hydroxy Latest Ref Range: 30.0 - 100.0 ng/mL 79.4   OBESITY BEHAVIORAL INTERVENTION VISIT  Today's visit was #22   Starting weight: 180 lbs Starting date: 05/28/2018 Today's weight: 171 lbs  Today's date: 05/07/2019 Total lbs lost to date: 9 At least 15 minutes were spent on discussing the following behavioral intervention visit.    05/07/2019  Height 5\' 1"  (1.549 m)  Weight 171 lb (77.6 kg)  BMI (Calculated) 32.33  BLOOD PRESSURE - SYSTOLIC [id number]  BLOOD PRESSURE - DIASTOLIC 72   Body Fat % [id number] %  Total Body Water (lbs) 65.8 lbs   ASK: We discussed the diagnosis of obesity with Sandra [name] today and Sandra [name] agreed to give [name] permission to discuss obesity behavioral modification therapy today.  ASSESS: Sandra [name] has the diagnosis of obesity and her BMI today is 32.3. Sandra [name] is in the action stage of change.   ADVISE: Sandra [name] was educated on the multiple health risks of obesity as well as the benefit of weight loss to improve her health. She was advised of the need for long term treatment and the importance of lifestyle modifications to improve her current health and to decrease her risk of future health problems.  AGREE: Multiple dietary modification options and treatment options were discussed and  Scharlotte agreed to follow the recommendations documented in the above note.  ARRANGE: Yola was educated on the importance of frequent visits to treat obesity as outlined per CMS and USPSTF guidelines and agreed to schedule her next follow up appointment today.  Migdalia Dk, am acting as Location manager for [employer], DO  I have reviewed the above documentation for accuracy and completeness, and I agree with the above. -[name], DO

## 2019-05-08 ENCOUNTER — Ambulatory Visit (INDEPENDENT_AMBULATORY_CARE_PROVIDER_SITE_OTHER): Payer: Medicare HMO | Admitting: Bariatrics

## 2019-05-08 ENCOUNTER — Encounter (INDEPENDENT_AMBULATORY_CARE_PROVIDER_SITE_OTHER): Payer: Self-pay | Admitting: Bariatrics

## 2019-05-24 DIAGNOSIS — R69 Illness, unspecified: Secondary | ICD-10-CM | POA: Diagnosis not present

## 2019-05-28 ENCOUNTER — Ambulatory Visit (INDEPENDENT_AMBULATORY_CARE_PROVIDER_SITE_OTHER): Payer: Medicare HMO | Admitting: Bariatrics

## 2019-05-28 ENCOUNTER — Other Ambulatory Visit: Payer: Self-pay

## 2019-05-28 ENCOUNTER — Encounter (INDEPENDENT_AMBULATORY_CARE_PROVIDER_SITE_OTHER): Payer: Self-pay | Admitting: Bariatrics

## 2019-05-28 DIAGNOSIS — Z6834 Body mass index (BMI) 34.0-34.9, adult: Secondary | ICD-10-CM

## 2019-05-28 DIAGNOSIS — Z794 Long term (current) use of insulin: Secondary | ICD-10-CM

## 2019-05-28 DIAGNOSIS — R69 Illness, unspecified: Secondary | ICD-10-CM | POA: Diagnosis not present

## 2019-05-28 DIAGNOSIS — F3289 Other specified depressive episodes: Secondary | ICD-10-CM | POA: Diagnosis not present

## 2019-05-28 DIAGNOSIS — E119 Type 2 diabetes mellitus without complications: Secondary | ICD-10-CM | POA: Diagnosis not present

## 2019-05-28 DIAGNOSIS — E669 Obesity, unspecified: Secondary | ICD-10-CM | POA: Diagnosis not present

## 2019-05-29 NOTE — Progress Notes (Signed)
Office: [phone]  /  Fax: [phone] TeleHealth Visit:  Sandra [name] has verbally consented to this TeleHealth visit today. The patient is located in the car, the provider is located at the [facility] office. The participants in this visit include the listed provider and patient. The visit was conducted today via telephone call.  HPI:   Chief Complaint: OBESITY Sandra [name] is here to discuss her progress with her obesity treatment plan. She is on the Category 1 plan and is following her eating plan approximately 85% of the time. She states she is walking 8,000 steps a day and doing strengthening exercises 30 minutes 2 times per week. Sandra [name] states that she has lost 2 lbs and is doing well overall. She started back with her personal trainer 1 time per week. We were unable to weigh the patient today for this TeleHealth visit. She feels as if she has lost 2 lbs since her last visit. She has lost 9 lbs since starting treatment with [name].  Diabetes II without complication Sandra [name] has a diagnosis of diabetes type II. Sandra [name] states fasting blood sugars range between 115 and 125. Last A1c was 6.9 on 11/14/2018. She has been working on intensive lifestyle modifications including diet, exercise, and weight loss to help control her blood glucose levels.  Depression with emotional eating behaviors Sandra [name] is struggling with emotional eating and using food for comfort to the extent that it is negatively impacting her health. She often snacks when she is not hungry. Sandra [name] sometimes feels she is out of control and then feels guilty that she made poor food choices. She has been working on behavior modification techniques to help reduce her emotional eating and has been somewhat successful. Sandra [name] is on Trokendi and shows no sign of suicidal or homicidal ideations.  Depression screen PHQ 2/9 05/28/2018  Decreased Interest 1  Down, Depressed, Hopeless 1  PHQ - 2 Score 2  Altered sleeping 1   Tired, decreased energy 2  Change in appetite 1  Feeling bad or failure about yourself  1  Trouble concentrating 0  Moving slowly or fidgety/restless 1  Suicidal thoughts 0  PHQ-9 Score 8  Difficult doing work/chores Not difficult at all   ASSESSMENT AND PLAN:  Other depression  Type 2 diabetes mellitus without complication, with long-term current use of insulin (HCC)  Class 1 obesity with serious comorbidity and body mass index (BMI) of 34.0 to 34.9 in adult, unspecified obesity type  PLAN:  Diabetes II without complication Sandra [name] has been given extensive diabetes education by myself today including ideal fasting and post-prandial blood glucose readings, individual ideal HgA1c goals  and hypoglycemia prevention. We discussed the importance of good blood sugar control to decrease the likelihood of diabetic complications such as nephropathy, neuropathy, limb loss, blindness, coronary artery disease, and death. We discussed the importance of intensive lifestyle modification including diet, exercise and weight loss as the first line treatment for diabetes. Sandra [name] agrees to continue her diabetes medications and will follow-up at the agreed upon time.  Depression with Emotional Eating Behaviors We discussed behavior modification techniques today to help Sandra [name] deal with her emotional eating and depression. Sandra [name] will continue the Trokendi and follow-up as directed.  Obesity Sandra [name] is currently in the action stage of change. As such, her goal is to continue with weight loss efforts. She has agreed to follow the Category 1 plan. Sandra [name] will work on meal planning and intentional eating. Sandra [name] has been instructed to continue 8,000 steps a  day and cardio/strength training for weight loss and overall health benefits. We discussed the following Behavioral Modification Strategies today: increasing lean protein intake, decreasing simple carbohydrates, increasing vegetables, increase H20 intake,  decrease eating out, no skipping meals, work on meal planning and easy cooking plans, keeping healthy foods in the home, and planning for success.  Sandra [name] has agreed to follow-up with our clinic in 2 weeks. She was informed of the importance of frequent follow-up visits to maximize her success with intensive lifestyle modifications for her multiple health conditions.  ALLERGIES: Allergies  Allergen Reactions  . Erythromycin   . Epinephrine Palpitations  . Metformin And Related Rash    MEDICATIONS: Current Outpatient Medications on File Prior to Visit  Medication Sig Dispense Refill  . amitriptyline (ELAVIL) 25 MG tablet Take one to three (1-3) tablets (25 mg to 75 mg total) by mouth twice daily as needed for back pain.    Marland Kitchen aspirin EC 81 MG tablet Take 1 tablet (81 mg total) by mouth daily. 90 tablet 3  . BIOTIN PO Take by mouth.    Marland Kitchen CALCIUM PO Take by mouth daily. Reported on 02/24/2016    . cetirizine (ZYRTEC) 10 MG tablet Take 10 mg by mouth daily.    . Cinnamon 500 MG capsule Take 500 mg by mouth daily.    . empagliflozin (JARDIANCE) 25 MG TABS tablet Take 25 mg by mouth daily.    Marland Kitchen ezetimibe (ZETIA) 10 MG tablet Take 1 tablet (10 mg total) by mouth daily. 90 tablet 3  . fluticasone (FLONASE) 50 MCG/ACT nasal spray Place 2 sprays into the nose daily.    Marland Kitchen ibuprofen (ADVIL,MOTRIN) 100 MG tablet Take 100 mg by mouth every 6 (six) hours as needed.      . Insulin Glargine (BASAGLAR KWIKPEN [place]) Inject 16 Units into the skin.     Marland Kitchen liraglutide (VICTOZA) 18 MG/3ML SOPN Inject 1.8 mg into the skin daily.    Marland Kitchen lisinopril (PRINIVIL,ZESTRIL) 2.5 MG tablet Take 2.5 mg by mouth daily.    . metoprolol (TOPROL-XL) 50 MG 24 hr tablet Take 25 mg by mouth daily.     . Multiple Vitamin (MULTIVITAMIN) tablet Take 1 tablet by mouth daily. Reported on 02/24/2016    . oxybutynin (DITROPAN-XL) 10 MG 24 hr tablet Take 10 mg by mouth at bedtime.    . rosuvastatin (CRESTOR) 10 MG tablet Take 10 mg by mouth  once a week.     Marland Kitchen tiZANidine (ZANAFLEX) 4 MG tablet Take 4 mg by mouth every 6 (six) hours as needed for muscle spasms (back pain).    . Topiramate ER (TROKENDI XR) 50 MG CP24 Take 50 mg by mouth daily. 30 capsule 0  . varenicline (CHANTIX STARTING MONTH PAK) 0.5 MG X 11 & 1 MG X 42 tablet Take one 0.5 mg tablet by mouth once daily for 3 days, then increase to one 0.5 mg tablet twice daily for 4 days, then increase to one 1 mg tablet twice daily. 53 tablet 0  . VITAMIN D, CHOLECALCIFEROL, PO Take 2,000 mg by mouth daily.      No current facility-administered medications on file prior to visit.     PAST MEDICAL HISTORY: Past Medical History:  Diagnosis Date  . Arthritis   . Back pain   . Chronic kidney disease   . Congenital absence of left kidney   . Congenital absence of left ovary   . Coronary artery disease 2004   STATUS POST STENTING OF THE  RIGHT CORONARY OSTIUM   . DDD (degenerative disc disease)   . Diabetes mellitus without complication ([place])   . Dislocated shoulder    left  . DVT (deep venous thrombosis) (HCC)    left posterior tibial vein  . High blood pressure   . Hypercholesterolemia   . Joint pain   . Osteoarthritis   . Peripheral vascular disease ([place])   . Sleep apnea   . Sleep apnea   . SOB (shortness of breath)   . Tobacco dependence   . Vitamin D deficiency     PAST SURGICAL HISTORY: Past Surgical History:  Procedure Laterality Date  . ANGIOPLASTY  2004  . bone spur shoulder    . FOOT SURGERY     plantar fascitis  . KNEE SURGERY  1950  . TOTAL ABDOMINAL HYSTERECTOMY    . TUBAL LIGATION    . VEIN BYPASS SURGERY     2014    SOCIAL HISTORY: Social History   Tobacco Use  . Smoking status: Former Smoker    Packs/day: 1.00    Quit date: 10/27/2015    Years since quitting: 3.5  . Smokeless tobacco: Never Used  Substance Use Topics  . Alcohol use: No    Alcohol/week: 0.0 standard drinks  . Drug use: No    FAMILY HISTORY: Family History   Problem Relation Age of Onset  . Leukemia Sister   . Hypertension Brother   . Heart disease Father        before age 32  . High blood pressure Father   . Sudden death Father   . Breast cancer Neg Hx    ROS: Review of Systems  Psychiatric/Behavioral: Positive for depression (emotional eating). Negative for suicidal ideas.       Negative for homicidal ideas.   PHYSICAL EXAM: Pt in no acute distress  RECENT LABS AND TESTS: BMET    Component Value Date/Time   NA 144 11/14/2018 1039   K 4.8 11/14/2018 1039   CL 103 11/14/2018 1039   CO2 24 11/14/2018 1039   GLUCOSE 122 (H) 11/14/2018 1039   GLUCOSE 115 (H) 10/14/2011 0931   BUN 20 11/14/2018 1039   CREATININE 1.09 (H) 11/14/2018 1039   CALCIUM 9.4 11/14/2018 1039   GFRNONAA 50 (L) 11/14/2018 1039   GFRAA 57 (L) 11/14/2018 1039   Lab Results  Component Value Date   HGBA1C 6.9 (H) 11/14/2018   No results found for: INSULIN CBC No results found for: WBC, RBC, HGB, HCT, PLT, MCV, MCH, MCHC, RDW, LYMPHSABS, MONOABS, EOSABS, BASOSABS Iron/TIBC/Ferritin/ %Sat No results found for: IRON, TIBC, FERRITIN, IRONPCTSAT Lipid Panel     Component Value Date/Time   CHOL 138 12/20/2018 0850   TRIG 159 (H) 12/20/2018 0850   HDL 47 12/20/2018 0850   CHOLHDL 2.9 12/20/2018 0850   CHOLHDL 3 10/14/2011 0931   VLDL 21.6 10/14/2011 0931   LDLCALC 59 12/20/2018 0850   Hepatic Function Panel     Component Value Date/Time   PROT 6.7 12/20/2018 0850   ALBUMIN 4.3 12/20/2018 0850   AST 20 12/20/2018 0850   ALT 21 12/20/2018 0850   ALKPHOS 74 12/20/2018 0850   BILITOT 0.3 12/20/2018 0850   BILIDIR 0.11 12/20/2018 0850   No results found for: TSH  Results for Sevigny, Khari H "JUDY" (MRN [id number]) as of 05/29/2019 10:26  Ref. Range 11/14/2018 10:39  Vitamin D, 25-Hydroxy Latest Ref Range: 30.0 - 100.0 ng/mL 79.4   I, Michaelene Song, am acting as Location manager  for [name], DO  I have reviewed the above documentation for accuracy  and completeness, and I agree with the above. -[name], DO

## 2019-06-04 DIAGNOSIS — R197 Diarrhea, unspecified: Secondary | ICD-10-CM | POA: Diagnosis not present

## 2019-06-04 DIAGNOSIS — R112 Nausea with vomiting, unspecified: Secondary | ICD-10-CM | POA: Diagnosis not present

## 2019-06-05 DIAGNOSIS — R197 Diarrhea, unspecified: Secondary | ICD-10-CM | POA: Diagnosis not present

## 2019-06-11 DIAGNOSIS — Z961 Presence of intraocular lens: Secondary | ICD-10-CM | POA: Diagnosis not present

## 2019-06-19 ENCOUNTER — Encounter (INDEPENDENT_AMBULATORY_CARE_PROVIDER_SITE_OTHER): Payer: Self-pay | Admitting: Bariatrics

## 2019-06-19 ENCOUNTER — Other Ambulatory Visit: Payer: Self-pay

## 2019-06-19 ENCOUNTER — Ambulatory Visit (INDEPENDENT_AMBULATORY_CARE_PROVIDER_SITE_OTHER): Payer: Medicare HMO | Admitting: Bariatrics

## 2019-06-19 VITALS — BP 146/70 | HR 90 | Temp 98.0°F | Ht 61.0 in | Wt 169.0 lb

## 2019-06-19 DIAGNOSIS — E669 Obesity, unspecified: Secondary | ICD-10-CM | POA: Diagnosis not present

## 2019-06-19 DIAGNOSIS — F3289 Other specified depressive episodes: Secondary | ICD-10-CM | POA: Diagnosis not present

## 2019-06-19 DIAGNOSIS — Z6832 Body mass index (BMI) 32.0-32.9, adult: Secondary | ICD-10-CM

## 2019-06-19 DIAGNOSIS — E8881 Metabolic syndrome: Secondary | ICD-10-CM

## 2019-06-19 DIAGNOSIS — R69 Illness, unspecified: Secondary | ICD-10-CM | POA: Diagnosis not present

## 2019-06-19 MED ORDER — TROKENDI XR 50 MG PO CP24: 50.0000 mg | ORAL_CAPSULE | Freq: Every day | ORAL | 0 refills | Status: DC

## 2019-06-20 DIAGNOSIS — R69 Illness, unspecified: Secondary | ICD-10-CM | POA: Diagnosis not present

## 2019-06-24 ENCOUNTER — Encounter (INDEPENDENT_AMBULATORY_CARE_PROVIDER_SITE_OTHER): Payer: Self-pay | Admitting: Bariatrics

## 2019-06-24 NOTE — Progress Notes (Signed)
Office: [phone]  /  Fax: [phone]   HPI:   Chief Complaint: OBESITY Sandra [name] is here to discuss her progress with her obesity treatment plan. She is on the Category 1 plan and is following her eating plan approximately 80% of the time. She states she is walking 7,000 steps 6 times per week. Sandra [name] is down 2 lbs and doing well overall. She states that she had a bout with food poisoning that "threw her off balance."  Her weight is 169 lb (76.7 kg) today and has had a weight loss of 2 pounds over a period of 6 weeks since her last visit. She has lost 11 lbs since starting treatment with [name].  Depression with emotional eating behaviors Sandra [name] is struggling with emotional eating and using food for comfort to the extent that it is negatively impacting her health. She often snacks when she is not hungry. Sandra [name] sometimes feels she is out of control and then feels guilty that she made poor food choices. She has been working on behavior modification techniques to help reduce her emotional eating and has been somewhat successful. Sandra [name] reports minimal stress eating and shows no sign of suicidal or homicidal ideations.  Depression screen PHQ 2/9 05/28/2018  Decreased Interest 1  Down, Depressed, Hopeless 1  PHQ - 2 Score 2  Altered sleeping 1  Tired, decreased energy 2  Change in appetite 1  Feeling bad or failure about yourself  1  Trouble concentrating 0  Moving slowly or fidgety/restless 1  Suicidal thoughts 0  PHQ-9 Score 8  Difficult doing work/chores Not difficult at all   Insulin Resistance Sandra [name] has a diagnosis of insulin resistance based on her elevated fasting insulin level >5. Last A1c reported to be 5.0 with an insulin of 10.1. Although Sandra [name]'s blood glucose readings are still under good control, insulin resistance puts her at greater risk of metabolic syndrome and diabetes. She is not taking metformin currently and continues to work on diet and exercise to decrease risk of  diabetes.  ASSESSMENT AND PLAN:  Insulin resistance  Other depression - with emotional eating - Plan: Topiramate ER (TROKENDI XR) 50 MG CP24  Class 1 obesity with serious comorbidity and body mass index (BMI) of 32.0 to 32.9 in adult, unspecified obesity type  PLAN:  Depression with Emotional Eating Behaviors We discussed behavior modification techniques today to help Sandra [name] deal with her emotional eating and depression. Sandra [name] was given a prescription for Trokendi 50 mg 1 PO daily #30 with 0 refills and agrees to follow-up with our clinic in 2 weeks.  Insulin Resistance Sandra [name] will continue to work on weight loss, exercise, and decreasing simple carbohydrates in her diet to help decrease the risk of diabetes. We dicussed metformin including benefits and risks. She was informed that eating too many simple carbohydrates or too many calories at one sitting increases the likelihood of GI side effects. Sandra [name] was instructed to decrease carbohydrates and increase protein. She will follow-up in 2 weeks as directed.  Obesity Sandra [name] is currently in the action stage of change. As such, her goal is to continue with weight loss efforts. She has agreed to follow the Category 1 plan. Sandra [name] will work on meal planning, intentional eating, increasing her water intake, and will continue with her personal trainer. Sandra [name] has been instructed to continue to walk and will continue with backup treadmill for weight loss and overall health benefits. We discussed the following Behavioral Modification Strategies today: increasing lean protein intake, decreasing simple carbohydrates,  increasing vegetables, increase H20 intake, decrease eating out, no skipping meals, work on meal planning and easy cooking plans, keeping healthy foods in the home, and planning for success.  Sandra [name] has agreed to follow up with our clinic in 2 weeks. She was informed of the importance of frequent follow up visits to maximize her success  with intensive lifestyle modifications for her multiple health conditions.  ALLERGIES: Allergies  Allergen Reactions   Erythromycin    Epinephrine Palpitations   Metformin And Related Rash    MEDICATIONS: Current Outpatient Medications on File Prior to Visit  Medication Sig Dispense Refill   amitriptyline (ELAVIL) 25 MG tablet Take one to three (1-3) tablets (25 mg to 75 mg total) by mouth twice daily as needed for back pain.     aspirin EC 81 MG tablet Take 1 tablet (81 mg total) by mouth daily. 90 tablet 3   BIOTIN PO Take by mouth.     CALCIUM PO Take by mouth daily. Reported on 02/24/2016     cetirizine (ZYRTEC) 10 MG tablet Take 10 mg by mouth daily.     Cinnamon 500 MG capsule Take 500 mg by mouth daily.     empagliflozin (JARDIANCE) 25 MG TABS tablet Take 25 mg by mouth daily.     ezetimibe (ZETIA) 10 MG tablet Take 1 tablet (10 mg total) by mouth daily. 90 tablet 3   fluticasone (FLONASE) 50 MCG/ACT nasal spray Place 2 sprays into the nose daily.     ibuprofen (ADVIL,MOTRIN) 100 MG tablet Take 100 mg by mouth every 6 (six) hours as needed.       Insulin Glargine (BASAGLAR KWIKPEN [place]) Inject 16 Units into the skin.      liraglutide (VICTOZA) 18 MG/3ML SOPN Inject 1.8 mg into the skin daily.     lisinopril (PRINIVIL,ZESTRIL) 2.5 MG tablet Take 2.5 mg by mouth daily.     metoprolol (TOPROL-XL) 50 MG 24 hr tablet Take 25 mg by mouth daily.      Multiple Vitamin (MULTIVITAMIN) tablet Take 1 tablet by mouth daily. Reported on 02/24/2016     oxybutynin (DITROPAN-XL) 10 MG 24 hr tablet Take 10 mg by mouth at bedtime.     rosuvastatin (CRESTOR) 10 MG tablet Take 10 mg by mouth once a week.      tiZANidine (ZANAFLEX) 4 MG tablet Take 4 mg by mouth every 6 (six) hours as needed for muscle spasms (back pain).     varenicline (CHANTIX STARTING MONTH PAK) 0.5 MG X 11 & 1 MG X 42 tablet Take one 0.5 mg tablet by mouth once daily for 3 days, then increase to one 0.5 mg  tablet twice daily for 4 days, then increase to one 1 mg tablet twice daily. 53 tablet 0   VITAMIN D, CHOLECALCIFEROL, PO Take 2,000 mg by mouth daily.      No current facility-administered medications on file prior to visit.     PAST MEDICAL HISTORY: Past Medical History:  Diagnosis Date   Arthritis    Back pain    Chronic kidney disease    Congenital absence of left kidney    Congenital absence of left ovary    Coronary artery disease 2004   STATUS POST STENTING OF THE RIGHT CORONARY OSTIUM    DDD (degenerative disc disease)    Diabetes mellitus without complication (HCC)    Dislocated shoulder    left   DVT (deep venous thrombosis) (HCC)    left posterior tibial vein  High blood pressure    Hypercholesterolemia    Joint pain    Osteoarthritis    Peripheral vascular disease (HCC)    Sleep apnea    Sleep apnea    SOB (shortness of breath)    Tobacco dependence    Vitamin D deficiency     PAST SURGICAL HISTORY: Past Surgical History:  Procedure Laterality Date   ANGIOPLASTY  2004   bone spur shoulder     FOOT SURGERY     plantar fascitis   KNEE SURGERY  1950   TOTAL ABDOMINAL HYSTERECTOMY     TUBAL LIGATION     VEIN BYPASS SURGERY     2014    SOCIAL HISTORY: Social History   Tobacco Use   Smoking status: Former Smoker    Packs/day: 1.00    Quit date: 10/27/2015    Years since quitting: 3.6   Smokeless tobacco: Never Used  Substance Use Topics   Alcohol use: No    Alcohol/week: 0.0 standard drinks   Drug use: No    FAMILY HISTORY: Family History  Problem Relation Age of Onset   Leukemia Sister    Hypertension Brother    Heart disease Father        before age 70   High blood pressure Father    Sudden death Father    Breast cancer Neg Hx    ROS: Review of Systems  Psychiatric/Behavioral: Positive for depression. Negative for suicidal ideas.       Negative for homicidal ideas.   PHYSICAL EXAM: Blood  pressure (!) 146/70, pulse 90, temperature 98 F (36.7 C), temperature source Oral, height 5\' 1"  (1.549 m), weight 169 lb (76.7 kg), SpO2 96 %. Body mass index is 31.93 kg/m. Physical Exam Vitals signs reviewed.  Constitutional:      Appearance: Normal appearance. She is obese.  Cardiovascular:     Rate and Rhythm: Normal rate.     Pulses: Normal pulses.  Pulmonary:     Effort: Pulmonary effort is normal.     Breath sounds: Normal breath sounds.  Musculoskeletal: Normal range of motion.  Skin:    General: Skin is warm and dry.  Neurological:     Mental Status: She is alert and oriented to person, place, and time.  Psychiatric:        Behavior: Behavior normal.   RECENT LABS AND TESTS: BMET    Component Value Date/Time   NA 144 11/14/2018 1039   K 4.8 11/14/2018 1039   CL 103 11/14/2018 1039   CO2 24 11/14/2018 1039   GLUCOSE 122 (H) 11/14/2018 1039   GLUCOSE 115 (H) 10/14/2011 0931   BUN 20 11/14/2018 1039   CREATININE 1.09 (H) 11/14/2018 1039   CALCIUM 9.4 11/14/2018 1039   GFRNONAA 50 (L) 11/14/2018 1039   GFRAA 57 (L) 11/14/2018 1039   Lab Results  Component Value Date   HGBA1C 6.9 (H) 11/14/2018   No results found for: INSULIN CBC No results found for: WBC, RBC, HGB, HCT, PLT, MCV, MCH, MCHC, RDW, LYMPHSABS, MONOABS, EOSABS, BASOSABS Iron/TIBC/Ferritin/ %Sat No results found for: IRON, TIBC, FERRITIN, IRONPCTSAT Lipid Panel     Component Value Date/Time   CHOL 138 12/20/2018 0850   TRIG 159 (H) 12/20/2018 0850   HDL 47 12/20/2018 0850   CHOLHDL 2.9 12/20/2018 0850   CHOLHDL 3 10/14/2011 0931   VLDL 21.6 10/14/2011 0931   LDLCALC 59 12/20/2018 0850   Hepatic Function Panel     Component Value Date/Time  PROT 6.7 12/20/2018 0850   ALBUMIN 4.3 12/20/2018 0850   AST 20 12/20/2018 0850   ALT 21 12/20/2018 0850   ALKPHOS 74 12/20/2018 0850   BILITOT 0.3 12/20/2018 0850   BILIDIR 0.11 12/20/2018 0850   No results found for: TSH  Results for Marotto,  Clayton H "JUDY" (MRN [id number]) as of 06/24/2019 08:43  Ref. Range 11/14/2018 10:39  Vitamin D, 25-Hydroxy Latest Ref Range: 30.0 - 100.0 ng/mL 79.4   OBESITY BEHAVIORAL INTERVENTION VISIT  Today's visit was #24  Starting weight: 180 lbs Starting date: 05/28/2018 Today's weight: 169 lbs Today's date: 06/19/2019 Total lbs lost to date: 11 At least 15 minutes were spent on discussing the following behavioral intervention visit.    06/19/2019  Height 5\' 1"  (1.549 m)  Weight 169 lb (76.7 kg)  BMI (Calculated) 31.95  BLOOD PRESSURE - SYSTOLIC [id number]  BLOOD PRESSURE - DIASTOLIC 70   Body Fat % 44 %  Total Body Water (lbs) 63.8 lbs   ASK: We discussed the diagnosis of obesity with Sandra [name] today and Yuka agreed to give [name] permission to discuss obesity behavioral modification therapy today.  ASSESS: Iyonia has the diagnosis of obesity and her BMI today is 32.0. Preslei is in the action stage of change.   ADVISE: Luara was educated on the multiple health risks of obesity as well as the benefit of weight loss to improve her health. She was advised of the need for long term treatment and the importance of lifestyle modifications to improve her current health and to decrease her risk of future health problems.  AGREE: Multiple dietary modification options and treatment options were discussed and  Makailyn agreed to follow the recommendations documented in the above note.  ARRANGE: Venezia was educated on the importance of frequent visits to treat obesity as outlined per CMS and USPSTF guidelines and agreed to schedule her next follow up appointment today.  Migdalia Dk, am acting as Location manager for [employer], DO  I have reviewed the above documentation for accuracy and completeness, and I agree with the above. -[name], DO

## 2019-06-27 DIAGNOSIS — G4733 Obstructive sleep apnea (adult) (pediatric): Secondary | ICD-10-CM | POA: Diagnosis not present

## 2019-07-04 DIAGNOSIS — R69 Illness, unspecified: Secondary | ICD-10-CM | POA: Diagnosis not present

## 2019-07-09 ENCOUNTER — Other Ambulatory Visit: Payer: Self-pay

## 2019-07-09 ENCOUNTER — Ambulatory Visit (INDEPENDENT_AMBULATORY_CARE_PROVIDER_SITE_OTHER): Payer: Medicare HMO | Admitting: Bariatrics

## 2019-07-09 VITALS — BP 110/65 | HR 78 | Temp 97.9°F | Ht 61.0 in | Wt 169.0 lb

## 2019-07-09 DIAGNOSIS — E119 Type 2 diabetes mellitus without complications: Secondary | ICD-10-CM | POA: Diagnosis not present

## 2019-07-09 DIAGNOSIS — E669 Obesity, unspecified: Secondary | ICD-10-CM | POA: Diagnosis not present

## 2019-07-09 DIAGNOSIS — R69 Illness, unspecified: Secondary | ICD-10-CM | POA: Diagnosis not present

## 2019-07-09 DIAGNOSIS — E559 Vitamin D deficiency, unspecified: Secondary | ICD-10-CM | POA: Diagnosis not present

## 2019-07-09 DIAGNOSIS — Z794 Long term (current) use of insulin: Secondary | ICD-10-CM

## 2019-07-09 DIAGNOSIS — Z6832 Body mass index (BMI) 32.0-32.9, adult: Secondary | ICD-10-CM | POA: Diagnosis not present

## 2019-07-09 DIAGNOSIS — F3289 Other specified depressive episodes: Secondary | ICD-10-CM

## 2019-07-09 NOTE — Progress Notes (Signed)
Office: [phone]  /  Fax: [phone]   HPI:   Chief Complaint: OBESITY Sandra [name] is here to discuss her progress with her obesity treatment plan. She is on the Category 1 plan and is following her eating plan approximately 60-70 % of the time. She states she is walking 9,000 steps per week. Sandra [name] has remained the same. She is taking Trokendi for stress and emotional eating. She is doing well with her water intake.  Her weight is 169 lb (76.7 kg) today and has not lost weight since her last visit. She has lost 11 lbs since starting treatment with [name].  Diabetes II Sandra [name] has a diagnosis of diabetes type II. Sandra [name] is taking Jardiance, Engineer, agricultural, and Victoza. Last A1c was 6.9 on 11/14/2018. She states her fasting BGs range between 120 and 130's. She denies hypoglycemia. She has been working on intensive lifestyle modifications including diet, exercise, and weight loss to help control her blood glucose levels.  Vitamin D Deficiency Sandra [name] has a diagnosis of vitamin D deficiency. She is currently taking OTC Vit D and denies nausea, vomiting or muscle weakness.  Depression with Emotional Eating Behaviors Sandra [name] is taking Topamax and she has no appropriate mood and effort. Sandra [name] struggles with emotional eating and using food for comfort to the extent that it is negatively impacting her health. She often snacks when she is not hungry. Sandra [name] sometimes feels she is out of control and then feels guilty that she made poor food choices. She has been working on behavior modification techniques to help reduce her emotional eating and has been somewhat successful. She shows no sign of suicidal or homicidal ideations.  Depression screen PHQ 2/9 05/28/2018  Decreased Interest 1  Down, Depressed, Hopeless 1  PHQ - 2 Score 2  Altered sleeping 1  Tired, decreased energy 2  Change in appetite 1  Feeling bad or failure about yourself  1  Trouble concentrating 0  Moving slowly or fidgety/restless 1   Suicidal thoughts 0  PHQ-9 Score 8  Difficult doing work/chores Not difficult at all    ASSESSMENT AND PLAN:  Type 2 diabetes mellitus without complication, with long-term current use of insulin ([place]) - Plan: Comprehensive metabolic panel, Hemoglobin A1c, Lipid Panel With LDL/HDL Ratio, TSH, T4, free, T3  Vitamin D deficiency - Plan: VITAMIN D 25 Hydroxy (Vit-D Deficiency, Fractures)  Other depression - with emotional eating   Class 1 obesity with serious comorbidity and body mass index (BMI) of 32.0 to 32.9 in adult, unspecified obesity type  PLAN:  Diabetes II Sandra [name] has been given extensive diabetes education by myself today including ideal fasting and post-prandial blood glucose readings, individual ideal Hgb A1c goals and hypoglycemia prevention. We discussed the importance of good blood sugar control to decrease the likelihood of diabetic complications such as nephropathy, neuropathy, limb loss, blindness, coronary artery disease, and death. We discussed the importance of intensive lifestyle modification including diet, exercise and weight loss as the first line treatment for diabetes. Ankita agrees to continue her diabetes medications, and we will check A1c today. Sandra [name] agrees to follow up with our clinic in 2 to 3 weeks.  Vitamin D Deficiency Sandra [name] was informed that low vitamin D levels contributes to fatigue and are associated with obesity, breast, and colon cancer. Sandra [name] agrees to continue taking OTC Vit D and will follow up for routine testing of vitamin D, at least 2-3 times per year. She was informed of the risk of over-replacement of vitamin D and agrees to not  increase her dose unless she discusses this with [name] first. We will check Vit D level today. Sandra [name] agrees to follow up with our clinic in 2 to 3 weeks.  Depression with Emotional Eating Behaviors We discussed behavior modification techniques today to help Sandra [name] deal with her emotional eating and depression. Sandra [name]  agrees to continue taking Topamax, and she agrees to follow up with our clinic in 2 to 3 weeks.  Obesity Sandra [name] is currently in the action stage of change. As such, her goal is to continue with weight loss efforts She has agreed to follow the Category 1 plan Shanece has been instructed to work up to a goal of 150 minutes of combined cardio and strengthening exercise per week or will continue exercise with a personal trainer (using FitBit) for weight loss and overall health benefits. We discussed the following Behavioral Modification Strategies today: increasing lean protein intake, decreasing simple carbohydrates, increasing vegetables, decrease eating out, increase H20 intake, no skipping meals, work on meal planning and easy cooking plans, and keeping healthy foods in the home   Sandra [name] has agreed to follow up with our clinic in 2 to 3 weeks. She was informed of the importance of frequent follow up visits to maximize her success with intensive lifestyle modifications for her multiple health conditions.  ALLERGIES: Allergies  Allergen Reactions  . Erythromycin   . Epinephrine Palpitations  . Metformin And Related Rash    MEDICATIONS: Current Outpatient Medications on File Prior to Visit  Medication Sig Dispense Refill  . amitriptyline (ELAVIL) 25 MG tablet Take one to three (1-3) tablets (25 mg to 75 mg total) by mouth twice daily as needed for back pain.    Marland Kitchen aspirin EC 81 MG tablet Take 1 tablet (81 mg total) by mouth daily. 90 tablet 3  . BIOTIN PO Take by mouth.    Marland Kitchen CALCIUM PO Take by mouth daily. Reported on 02/24/2016    . cetirizine (ZYRTEC) 10 MG tablet Take 10 mg by mouth daily.    . Cinnamon 500 MG capsule Take 500 mg by mouth daily.    . empagliflozin (JARDIANCE) 25 MG TABS tablet Take 25 mg by mouth daily.    Marland Kitchen ezetimibe (ZETIA) 10 MG tablet Take 1 tablet (10 mg total) by mouth daily. 90 tablet 3  . fluticasone (FLONASE) 50 MCG/ACT nasal spray Place 2 sprays into the nose  daily.    Marland Kitchen ibuprofen (ADVIL,MOTRIN) 100 MG tablet Take 100 mg by mouth every 6 (six) hours as needed.      . Insulin Glargine (BASAGLAR KWIKPEN [place]) Inject 16 Units into the skin.     Marland Kitchen liraglutide (VICTOZA) 18 MG/3ML SOPN Inject 1.8 mg into the skin daily.    Marland Kitchen lisinopril (PRINIVIL,ZESTRIL) 2.5 MG tablet Take 2.5 mg by mouth daily.    . metoprolol (TOPROL-XL) 50 MG 24 hr tablet Take 25 mg by mouth daily.     . Multiple Vitamin (MULTIVITAMIN) tablet Take 1 tablet by mouth daily. Reported on 02/24/2016    . oxybutynin (DITROPAN-XL) 10 MG 24 hr tablet Take 10 mg by mouth at bedtime.    . rosuvastatin (CRESTOR) 10 MG tablet Take 10 mg by mouth once a week.     Marland Kitchen tiZANidine (ZANAFLEX) 4 MG tablet Take 4 mg by mouth every 6 (six) hours as needed for muscle spasms (back pain).    . Topiramate ER (TROKENDI XR) 50 MG CP24 Take 50 mg by mouth daily. 30 capsule 0  . varenicline (CHANTIX  STARTING MONTH PAK) 0.5 MG X 11 & 1 MG X 42 tablet Take one 0.5 mg tablet by mouth once daily for 3 days, then increase to one 0.5 mg tablet twice daily for 4 days, then increase to one 1 mg tablet twice daily. 53 tablet 0  . VITAMIN D, CHOLECALCIFEROL, PO Take 2,000 mg by mouth daily.      No current facility-administered medications on file prior to visit.     PAST MEDICAL HISTORY: Past Medical History:  Diagnosis Date  . Arthritis   . Back pain   . Chronic kidney disease   . Congenital absence of left kidney   . Congenital absence of left ovary   . Coronary artery disease 2004   STATUS POST STENTING OF THE RIGHT CORONARY OSTIUM   . DDD (degenerative disc disease)   . Diabetes mellitus without complication ([place])   . Dislocated shoulder    left  . DVT (deep venous thrombosis) (HCC)    left posterior tibial vein  . High blood pressure   . Hypercholesterolemia   . Joint pain   . Osteoarthritis   . Peripheral vascular disease ([place])   . Sleep apnea   . Sleep apnea   . SOB (shortness of breath)   . Tobacco  dependence   . Vitamin D deficiency     PAST SURGICAL HISTORY: Past Surgical History:  Procedure Laterality Date  . ANGIOPLASTY  2004  . bone spur shoulder    . FOOT SURGERY     plantar fascitis  . KNEE SURGERY  1950  . TOTAL ABDOMINAL HYSTERECTOMY    . TUBAL LIGATION    . VEIN BYPASS SURGERY     2014    SOCIAL HISTORY: Social History   Tobacco Use  . Smoking status: Former Smoker    Packs/day: 1.00    Quit date: 10/27/2015    Years since quitting: 3.7  . Smokeless tobacco: Never Used  Substance Use Topics  . Alcohol use: No    Alcohol/week: 0.0 standard drinks  . Drug use: No    FAMILY HISTORY: Family History  Problem Relation Age of Onset  . Leukemia Sister   . Hypertension Brother   . Heart disease Father        before age 56  . High blood pressure Father   . Sudden death Father   . Breast cancer Neg Hx     ROS: Review of Systems  Constitutional: Negative for weight loss.  Gastrointestinal: Negative for nausea and vomiting.  Musculoskeletal:       Negative muscle weakness  Endo/Heme/Allergies:       Negative hypoglycemia  Psychiatric/Behavioral: Positive for depression. Negative for suicidal ideas.    PHYSICAL EXAM: Blood pressure 110/65, pulse 78, temperature 97.9 F (36.6 C), height 5\' 1"  (1.549 m), weight 169 lb (76.7 kg), SpO2 95 %. Body mass index is 31.93 kg/m. Physical Exam Vitals signs reviewed.  Constitutional:      Appearance: Normal appearance. She is obese.  Cardiovascular:     Rate and Rhythm: Normal rate.     Pulses: Normal pulses.  Pulmonary:     Effort: Pulmonary effort is normal.     Breath sounds: Normal breath sounds.  Musculoskeletal: Normal range of motion.  Skin:    General: Skin is warm and dry.  Neurological:     Mental Status: She is alert and oriented to person, place, and time.  Psychiatric:        Mood and Affect: Mood  normal.        Behavior: Behavior normal.     RECENT LABS AND TESTS: BMET     Component Value Date/Time   NA 144 11/14/2018 1039   K 4.8 11/14/2018 1039   CL 103 11/14/2018 1039   CO2 24 11/14/2018 1039   GLUCOSE 122 (H) 11/14/2018 1039   GLUCOSE 115 (H) 10/14/2011 0931   BUN 20 11/14/2018 1039   CREATININE 1.09 (H) 11/14/2018 1039   CALCIUM 9.4 11/14/2018 1039   GFRNONAA 50 (L) 11/14/2018 1039   GFRAA 57 (L) 11/14/2018 1039   Lab Results  Component Value Date   HGBA1C 6.9 (H) 11/14/2018   No results found for: INSULIN CBC No results found for: WBC, RBC, HGB, HCT, PLT, MCV, MCH, MCHC, RDW, LYMPHSABS, MONOABS, EOSABS, BASOSABS Iron/TIBC/Ferritin/ %Sat No results found for: IRON, TIBC, FERRITIN, IRONPCTSAT Lipid Panel     Component Value Date/Time   CHOL 138 12/20/2018 0850   TRIG 159 (H) 12/20/2018 0850   HDL 47 12/20/2018 0850   CHOLHDL 2.9 12/20/2018 0850   CHOLHDL 3 10/14/2011 0931   VLDL 21.6 10/14/2011 0931   LDLCALC 59 12/20/2018 0850   Hepatic Function Panel     Component Value Date/Time   PROT 6.7 12/20/2018 0850   ALBUMIN 4.3 12/20/2018 0850   AST 20 12/20/2018 0850   ALT 21 12/20/2018 0850   ALKPHOS 74 12/20/2018 0850   BILITOT 0.3 12/20/2018 0850   BILIDIR 0.11 12/20/2018 0850   No results found for: TSH    OBESITY BEHAVIORAL INTERVENTION VISIT  Today's visit was # 25   Starting weight: 180 lbs Starting date: 05/28/18 Today's weight : 169 lbs  Today's date: 07/09/2019 Total lbs lost to date: 11 At least 15 minutes were spent on discussing the following behavioral intervention visit.   ASK: We discussed the diagnosis of obesity with [name] today and Jaretzi agreed to give [name] permission to discuss obesity behavioral modification therapy today.  ASSESS: Jenyah has the diagnosis of obesity and her BMI today is 31.95 Catasha is in the action stage of change   ADVISE: Trichia was educated on the multiple health risks of obesity as well as the benefit of weight loss to improve her health. She was advised of the need  for long term treatment and the importance of lifestyle modifications to improve her current health and to decrease her risk of future health problems.  AGREE: Multiple dietary modification options and treatment options were discussed and  Katielyn agreed to follow the recommendations documented in the above note.  ARRANGE: Sandra was educated on the importance of frequent visits to treat obesity as outlined per CMS and USPSTF guidelines and agreed to schedule her next follow up appointment today.  [name], am acting as transcriptionist for [employer], DO  I have reviewed the above documentation for accuracy and completeness, and I agree with the above. -[name], DO

## 2019-07-10 DIAGNOSIS — R69 Illness, unspecified: Secondary | ICD-10-CM | POA: Diagnosis not present

## 2019-07-10 LAB — T3: T3, Total: 114 ng/dL (ref 71–180)

## 2019-07-10 LAB — LIPID PANEL WITH LDL/HDL RATIO
Cholesterol, Total: 168 mg/dL (ref 100–199)
HDL: 46 mg/dL (ref >39)
LDL Chol Calc (NIH): 90 mg/dL (ref 0–99)
LDL/HDL Ratio: 2 ratio (ref 0.0–3.2)
Triglycerides: 186 mg/dL — ABNORMAL HIGH (ref 0–149)
VLDL Cholesterol Cal: 32 mg/dL (ref 5–40)

## 2019-07-10 LAB — COMPREHENSIVE METABOLIC PANEL
ALT: 20 IU/L (ref 0–32)
AST: 21 IU/L (ref 0–40)
Albumin/Globulin Ratio: 1.9 (ref 1.2–2.2)
Albumin: 4.3 g/dL (ref 3.7–4.7)
Alkaline Phosphatase: 73 IU/L (ref 39–117)
BUN/Creatinine Ratio: 15 (ref 12–28)
BUN: 15 mg/dL (ref 8–27)
Bilirubin Total: 0.3 mg/dL (ref 0.0–1.2)
CO2: 24 mmol/L (ref 20–29)
Calcium: 9.6 mg/dL (ref 8.7–10.3)
Chloride: 107 mmol/L — ABNORMAL HIGH (ref 96–106)
Creatinine, Ser: 0.97 mg/dL (ref 0.57–1.00)
GFR calc Af Amer: 66 mL/min/{1.73_m2} (ref >59)
GFR calc non Af Amer: 57 mL/min/{1.73_m2} — ABNORMAL LOW (ref >59)
Globulin, Total: 2.3 g/dL (ref 1.5–4.5)
Glucose: 112 mg/dL — ABNORMAL HIGH (ref 65–99)
Potassium: 4.7 mmol/L (ref 3.5–5.2)
Sodium: 144 mmol/L (ref 134–144)
Total Protein: 6.6 g/dL (ref 6.0–8.5)

## 2019-07-10 LAB — HEMOGLOBIN A1C
Est. average glucose Bld gHb Est-mCnc: 157 mg/dL
Hgb A1c MFr Bld: 7.1 % — ABNORMAL HIGH (ref 4.8–5.6)

## 2019-07-10 LAB — VITAMIN D 25 HYDROXY (VIT D DEFICIENCY, FRACTURES): Vit D, 25-Hydroxy: 59.2 ng/mL (ref 30.0–100.0)

## 2019-07-10 LAB — TSH: TSH: 2.1 u[IU]/mL (ref 0.450–4.500)

## 2019-07-10 LAB — T4, FREE: Free T4: 0.89 ng/dL (ref 0.82–1.77)

## 2019-07-11 ENCOUNTER — Encounter (INDEPENDENT_AMBULATORY_CARE_PROVIDER_SITE_OTHER): Payer: Self-pay | Admitting: Bariatrics

## 2019-07-24 DIAGNOSIS — Z9189 Other specified personal risk factors, not elsewhere classified: Secondary | ICD-10-CM | POA: Diagnosis not present

## 2019-07-24 DIAGNOSIS — Z20828 Contact with and (suspected) exposure to other viral communicable diseases: Secondary | ICD-10-CM | POA: Diagnosis not present

## 2019-07-24 DIAGNOSIS — I10 Essential (primary) hypertension: Secondary | ICD-10-CM | POA: Diagnosis not present

## 2019-07-24 DIAGNOSIS — E119 Type 2 diabetes mellitus without complications: Secondary | ICD-10-CM | POA: Diagnosis not present

## 2019-07-28 DIAGNOSIS — G4733 Obstructive sleep apnea (adult) (pediatric): Secondary | ICD-10-CM | POA: Diagnosis not present

## 2019-08-03 DIAGNOSIS — R69 Illness, unspecified: Secondary | ICD-10-CM | POA: Diagnosis not present

## 2019-08-05 ENCOUNTER — Ambulatory Visit (INDEPENDENT_AMBULATORY_CARE_PROVIDER_SITE_OTHER): Payer: Medicare HMO | Admitting: Bariatrics

## 2019-08-05 ENCOUNTER — Other Ambulatory Visit: Payer: Self-pay | Admitting: Cardiology

## 2019-08-05 ENCOUNTER — Other Ambulatory Visit: Payer: Self-pay

## 2019-08-05 ENCOUNTER — Encounter (INDEPENDENT_AMBULATORY_CARE_PROVIDER_SITE_OTHER): Payer: Self-pay | Admitting: Bariatrics

## 2019-08-05 VITALS — BP 103/62 | HR 79 | Temp 97.8°F | Ht 61.0 in | Wt 169.0 lb

## 2019-08-05 DIAGNOSIS — Z0000 Encounter for general adult medical examination without abnormal findings: Secondary | ICD-10-CM | POA: Diagnosis not present

## 2019-08-05 DIAGNOSIS — I2510 Atherosclerotic heart disease of native coronary artery without angina pectoris: Secondary | ICD-10-CM | POA: Diagnosis not present

## 2019-08-05 DIAGNOSIS — Z794 Long term (current) use of insulin: Secondary | ICD-10-CM | POA: Diagnosis not present

## 2019-08-05 DIAGNOSIS — E669 Obesity, unspecified: Secondary | ICD-10-CM

## 2019-08-05 DIAGNOSIS — E119 Type 2 diabetes mellitus without complications: Secondary | ICD-10-CM | POA: Diagnosis not present

## 2019-08-05 DIAGNOSIS — F3289 Other specified depressive episodes: Secondary | ICD-10-CM

## 2019-08-05 DIAGNOSIS — E782 Mixed hyperlipidemia: Secondary | ICD-10-CM | POA: Diagnosis not present

## 2019-08-05 DIAGNOSIS — Z1389 Encounter for screening for other disorder: Secondary | ICD-10-CM | POA: Diagnosis not present

## 2019-08-05 DIAGNOSIS — E559 Vitamin D deficiency, unspecified: Secondary | ICD-10-CM | POA: Diagnosis not present

## 2019-08-05 DIAGNOSIS — R69 Illness, unspecified: Secondary | ICD-10-CM | POA: Diagnosis not present

## 2019-08-05 DIAGNOSIS — I10 Essential (primary) hypertension: Secondary | ICD-10-CM | POA: Diagnosis not present

## 2019-08-05 DIAGNOSIS — Z6832 Body mass index (BMI) 32.0-32.9, adult: Secondary | ICD-10-CM

## 2019-08-05 DIAGNOSIS — E1159 Type 2 diabetes mellitus with other circulatory complications: Secondary | ICD-10-CM | POA: Diagnosis not present

## 2019-08-05 DIAGNOSIS — G4733 Obstructive sleep apnea (adult) (pediatric): Secondary | ICD-10-CM | POA: Diagnosis not present

## 2019-08-05 DIAGNOSIS — J309 Allergic rhinitis, unspecified: Secondary | ICD-10-CM | POA: Diagnosis not present

## 2019-08-05 MED ORDER — TROKENDI XR 50 MG PO CP24: 50.0000 mg | ORAL_CAPSULE | Freq: Every day | ORAL | 0 refills | Status: DC

## 2019-08-05 NOTE — Progress Notes (Signed)
Office: [phone]  /  Fax: [phone]   HPI:   Chief Complaint: OBESITY Sandra [name] is here to discuss her progress with her obesity treatment plan. She is on the Category 1 plan. She states she is walking 8,000 steps daily 6 times per week. Sandra [name]'s weight has stayed the same. She continues to take Trokendi. She reports doing okay with her protein and water intake. Her weight is 169 lb (76.7 kg) today and has not lost weight since her last visit. She has lost 11 lbs since starting treatment with [name].  Depression with emotional eating behaviors Sandra [name] is struggling with emotional eating and using food for comfort to the extent that it is negatively impacting her health. She often snacks when she is not hungry. Sandra [name] sometimes feels she is out of control and then feels guilty that she made poor food choices. She has been working on behavior modification techniques to help reduce her emotional eating and has been somewhat successful. She shows no sign of suicidal or homicidal ideations.  Depression screen PHQ 2/9 05/28/2018  Decreased Interest 1  Down, Depressed, Hopeless 1  PHQ - 2 Score 2  Altered sleeping 1  Tired, decreased energy 2  Change in appetite 1  Feeling bad or failure about yourself  1  Trouble concentrating 0  Moving slowly or fidgety/restless 1  Suicidal thoughts 0  PHQ-9 Score 8  Difficult doing work/chores Not difficult at all   Diabetes II Sandra [name] has a diagnosis of diabetes type II and is taking Jardiance, Basaglar, and Victoza. Sandra [name] states fasting blood sugars average between 110 and 120's. Last A1c was 7.1 on 07/09/2019 (6.9 on 11/14/2018). She has been working on intensive lifestyle modifications including diet, exercise, and weight loss to help control her blood glucose levels.  ASSESSMENT AND PLAN:  Type 2 diabetes mellitus without complication, with long-term current use of insulin (HCC)  Other depression - with emotional eating - Plan: Topiramate ER  (TROKENDI XR) 50 MG CP24  Class 1 obesity with serious comorbidity and body mass index (BMI) of 32.0 to 32.9 in adult, unspecified obesity type  PLAN:  Depression with Emotional Eating Behaviors We discussed behavior modification techniques today to help Sandra [name] deal with her emotional eating and depression. Cherrie was given a prescription for Trokendi 50 mg 1 PO daily #30 with 0 refills. She agrees to follow-up with our clinic in 2-3 weeks.  Diabetes II Sandra [name] has been given extensive diabetes education by myself today including ideal fasting and post-prandial blood glucose readings, individual ideal HgA1c goals  and hypoglycemia prevention. We discussed the importance of good blood sugar control to decrease the likelihood of diabetic complications such as nephropathy, neuropathy, limb loss, blindness, coronary artery disease, and death. We discussed the importance of intensive lifestyle modification including diet, exercise and weight loss as the first line treatment for diabetes. Sandra [name] agrees to continue her diabetes medications and will follow-up at the agreed upon time.  Obesity Sandra [name] is currently in the action stage of change. As such, her goal is to continue with weight loss efforts. She has agreed to follow the Category 1 plan. Sandra [name] will work on meal planning and intentional eating. Sandra [name] has been instructed to continue walking 8,000 steps a day 6 days per week on the treadmill for weight loss and overall health benefits. We discussed the following Behavioral Modification Strategies today: increasing lean protein intake, decreasing simple carbohydrates, increasing vegetables, increase H20 intake, decrease eating out, no skipping meals, work on meal planning and  easy cooking plans, keeping healthy foods in the home, and planning for success.  Sandra [name] has agreed to follow up with our clinic in 2 weeks. She was informed of the importance of frequent follow up visits to maximize her  success with intensive lifestyle modifications for her multiple health conditions.  ALLERGIES: Allergies  Allergen Reactions   Erythromycin    Epinephrine Palpitations   Metformin And Related Rash    MEDICATIONS: Current Outpatient Medications on File Prior to Visit  Medication Sig Dispense Refill   amitriptyline (ELAVIL) 25 MG tablet Take one to three (1-3) tablets (25 mg to 75 mg total) by mouth twice daily as needed for back pain.     aspirin EC 81 MG tablet Take 1 tablet (81 mg total) by mouth daily. 90 tablet 3   BIOTIN PO Take by mouth.     CALCIUM PO Take by mouth daily. Reported on 02/24/2016     cetirizine (ZYRTEC) 10 MG tablet Take 10 mg by mouth daily.     Cinnamon 500 MG capsule Take 500 mg by mouth daily.     empagliflozin (JARDIANCE) 25 MG TABS tablet Take 25 mg by mouth daily.     ezetimibe (ZETIA) 10 MG tablet Take 1 tablet (10 mg total) by mouth daily. 90 tablet 3   fluticasone (FLONASE) 50 MCG/ACT nasal spray Place 2 sprays into the nose daily.     ibuprofen (ADVIL,MOTRIN) 100 MG tablet Take 100 mg by mouth every 6 (six) hours as needed.       Insulin Glargine (BASAGLAR KWIKPEN [place]) Inject 16 Units into the skin.      liraglutide (VICTOZA) 18 MG/3ML SOPN Inject 1.8 mg into the skin daily.     lisinopril (PRINIVIL,ZESTRIL) 2.5 MG tablet Take 2.5 mg by mouth daily.     metoprolol (TOPROL-XL) 50 MG 24 hr tablet Take 25 mg by mouth daily.      Multiple Vitamin (MULTIVITAMIN) tablet Take 1 tablet by mouth daily. Reported on 02/24/2016     oxybutynin (DITROPAN-XL) 10 MG 24 hr tablet Take 10 mg by mouth at bedtime.     rosuvastatin (CRESTOR) 10 MG tablet Take 10 mg by mouth once a week.      tiZANidine (ZANAFLEX) 4 MG tablet Take 4 mg by mouth every 6 (six) hours as needed for muscle spasms (back pain).     varenicline (CHANTIX STARTING MONTH PAK) 0.5 MG X 11 & 1 MG X 42 tablet Take one 0.5 mg tablet by mouth once daily for 3 days, then increase to one 0.5  mg tablet twice daily for 4 days, then increase to one 1 mg tablet twice daily. 53 tablet 0   VITAMIN D, CHOLECALCIFEROL, PO Take 2,000 mg by mouth daily.      No current facility-administered medications on file prior to visit.     PAST MEDICAL HISTORY: Past Medical History:  Diagnosis Date   Arthritis    Back pain    Chronic kidney disease    Congenital absence of left kidney    Congenital absence of left ovary    Coronary artery disease 2004   STATUS POST STENTING OF THE RIGHT CORONARY OSTIUM    DDD (degenerative disc disease)    Diabetes mellitus without complication (HCC)    Dislocated shoulder    left   DVT (deep venous thrombosis) (HCC)    left posterior tibial vein   High blood pressure    Hypercholesterolemia    Joint pain  Osteoarthritis    Peripheral vascular disease (HCC)    Sleep apnea    Sleep apnea    SOB (shortness of breath)    Tobacco dependence    Vitamin D deficiency     PAST SURGICAL HISTORY: Past Surgical History:  Procedure Laterality Date   ANGIOPLASTY  2004   bone spur shoulder     FOOT SURGERY     plantar fascitis   KNEE SURGERY  1950   TOTAL ABDOMINAL HYSTERECTOMY     TUBAL LIGATION     VEIN BYPASS SURGERY     2014    SOCIAL HISTORY: Social History   Tobacco Use   Smoking status: Former Smoker    Packs/day: 1.00    Quit date: 10/27/2015    Years since quitting: 3.7   Smokeless tobacco: Never Used  Substance Use Topics   Alcohol use: No    Alcohol/week: 0.0 standard drinks   Drug use: No    FAMILY HISTORY: Family History  Problem Relation Age of Onset   Leukemia Sister    Hypertension Brother    Heart disease Father        before age 14   High blood pressure Father    Sudden death Father    Breast cancer Neg Hx    ROS: Review of Systems  Psychiatric/Behavioral: Positive for depression (emotional eating). Negative for suicidal ideas.       Negative for homicidal ideas.    PHYSICAL EXAM: Blood pressure 103/62, pulse 79, temperature 97.8 F (36.6 C), height 5\' 1"  (1.549 m), weight 169 lb (76.7 kg), SpO2 97 %. Body mass index is 31.93 kg/m. Physical Exam Vitals signs reviewed.  Constitutional:      Appearance: Normal appearance. She is obese.  Cardiovascular:     Rate and Rhythm: Normal rate.     Pulses: Normal pulses.  Pulmonary:     Effort: Pulmonary effort is normal.     Breath sounds: Normal breath sounds.  Musculoskeletal: Normal range of motion.  Skin:    General: Skin is warm and dry.  Neurological:     Mental Status: She is alert and oriented to person, place, and time.  Psychiatric:        Behavior: Behavior normal.   RECENT LABS AND TESTS: BMET    Component Value Date/Time   NA 144 07/09/2019 0923   K 4.7 07/09/2019 0923   CL 107 (H) 07/09/2019 0923   CO2 24 07/09/2019 0923   GLUCOSE 112 (H) 07/09/2019 0923   GLUCOSE 115 (H) 10/14/2011 0931   BUN 15 07/09/2019 0923   CREATININE 0.97 07/09/2019 0923   CALCIUM 9.6 07/09/2019 0923   GFRNONAA 57 (L) 07/09/2019 0923   GFRAA 66 07/09/2019 0923   Lab Results  Component Value Date   HGBA1C 7.1 (H) 07/09/2019   HGBA1C 6.9 (H) 11/14/2018   No results found for: INSULIN CBC No results found for: WBC, RBC, HGB, HCT, PLT, MCV, MCH, MCHC, RDW, LYMPHSABS, MONOABS, EOSABS, BASOSABS Iron/TIBC/Ferritin/ %Sat No results found for: IRON, TIBC, FERRITIN, IRONPCTSAT Lipid Panel     Component Value Date/Time   CHOL 168 07/09/2019 0923   TRIG 186 (H) 07/09/2019 0923   HDL 46 07/09/2019 0923   CHOLHDL 2.9 12/20/2018 0850   CHOLHDL 3 10/14/2011 0931   VLDL 21.6 10/14/2011 0931   LDLCALC 90 07/09/2019 0923   Hepatic Function Panel     Component Value Date/Time   PROT 6.6 07/09/2019 0923   ALBUMIN 4.3 07/09/2019 0923  AST 21 07/09/2019 0923   ALT 20 07/09/2019 0923   ALKPHOS 73 07/09/2019 0923   BILITOT 0.3 07/09/2019 0923   BILIDIR 0.11 12/20/2018 0850      Component Value  Date/Time   TSH 2.100 07/09/2019 0923   Results for Fell, Sheneika H "JUDY" (MRN [id number]) as of 08/05/2019 12:02  Ref. Range 07/09/2019 09:23  Vitamin D, 25-Hydroxy Latest Ref Range: 30.0 - 100.0 ng/mL 59.2   OBESITY BEHAVIORAL INTERVENTION VISIT  Today's visit was #26  Starting weight: 180 lbs Starting date: 05/28/2018 Today's weight: 169 lbs Today's date: 08/05/2019 Total lbs lost to date: 11 At least 15 minutes were spent on discussing the following behavioral intervention visit.    08/05/2019  Height 5\' 1"  (1.549 m)  Weight 169 lb (76.7 kg)  BMI (Calculated) 31.95  BLOOD PRESSURE - SYSTOLIC [id number]  BLOOD PRESSURE - DIASTOLIC 62   Body Fat % [id number] %  Total Body Water (lbs) 64.8 lbs   ASK: We discussed the diagnosis of obesity with [name] today and Mychael agreed to give [name] permission to discuss obesity behavioral modification therapy today.  ASSESS: Shyree has the diagnosis of obesity and her BMI today is 32.0. Sunni is in the action stage of change.   ADVISE: Draizy was educated on the multiple health risks of obesity as well as the benefit of weight loss to improve her health. She was advised of the need for long term treatment and the importance of lifestyle modifications to improve her current health and to decrease her risk of future health problems.  AGREE: Multiple dietary modification options and treatment options were discussed and  Aleacia agreed to follow the recommendations documented in the above note.  ARRANGE: Evian was educated on the importance of frequent visits to treat obesity as outlined per CMS and USPSTF guidelines and agreed to schedule her next follow up appointment today.  Migdalia Dk, am acting as Location manager for [employer], DO  I have reviewed the above documentation for accuracy and completeness, and I agree with the above. -[name], DO

## 2019-08-13 ENCOUNTER — Other Ambulatory Visit: Payer: Self-pay | Admitting: Family Medicine

## 2019-08-13 DIAGNOSIS — Z1231 Encounter for screening mammogram for malignant neoplasm of breast: Secondary | ICD-10-CM

## 2019-08-15 DIAGNOSIS — E782 Mixed hyperlipidemia: Secondary | ICD-10-CM | POA: Diagnosis not present

## 2019-08-15 DIAGNOSIS — E1159 Type 2 diabetes mellitus with other circulatory complications: Secondary | ICD-10-CM | POA: Diagnosis not present

## 2019-08-15 DIAGNOSIS — Z794 Long term (current) use of insulin: Secondary | ICD-10-CM | POA: Diagnosis not present

## 2019-08-15 DIAGNOSIS — E669 Obesity, unspecified: Secondary | ICD-10-CM | POA: Diagnosis not present

## 2019-08-15 DIAGNOSIS — Z6831 Body mass index (BMI) 31.0-31.9, adult: Secondary | ICD-10-CM | POA: Diagnosis not present

## 2019-08-15 DIAGNOSIS — R7989 Other specified abnormal findings of blood chemistry: Secondary | ICD-10-CM | POA: Diagnosis not present

## 2019-08-15 DIAGNOSIS — I2510 Atherosclerotic heart disease of native coronary artery without angina pectoris: Secondary | ICD-10-CM | POA: Diagnosis not present

## 2019-08-19 ENCOUNTER — Ambulatory Visit (INDEPENDENT_AMBULATORY_CARE_PROVIDER_SITE_OTHER): Payer: Medicare HMO | Admitting: Bariatrics

## 2019-08-19 ENCOUNTER — Encounter (INDEPENDENT_AMBULATORY_CARE_PROVIDER_SITE_OTHER): Payer: Self-pay | Admitting: Bariatrics

## 2019-08-19 ENCOUNTER — Other Ambulatory Visit: Payer: Self-pay

## 2019-08-19 VITALS — BP 118/67 | HR 77 | Temp 98.0°F | Ht 61.0 in | Wt 170.8 lb

## 2019-08-19 DIAGNOSIS — E669 Obesity, unspecified: Secondary | ICD-10-CM | POA: Diagnosis not present

## 2019-08-19 DIAGNOSIS — Z6832 Body mass index (BMI) 32.0-32.9, adult: Secondary | ICD-10-CM

## 2019-08-19 DIAGNOSIS — E119 Type 2 diabetes mellitus without complications: Secondary | ICD-10-CM

## 2019-08-19 DIAGNOSIS — Z794 Long term (current) use of insulin: Secondary | ICD-10-CM | POA: Diagnosis not present

## 2019-08-19 DIAGNOSIS — F3289 Other specified depressive episodes: Secondary | ICD-10-CM

## 2019-08-19 DIAGNOSIS — R69 Illness, unspecified: Secondary | ICD-10-CM | POA: Diagnosis not present

## 2019-08-19 MED ORDER — TROKENDI XR 50 MG PO CP24: 50.0000 mg | ORAL_CAPSULE | Freq: Every day | ORAL | 0 refills | Status: DC

## 2019-08-19 NOTE — Progress Notes (Signed)
Office: [phone]  /  Fax: [phone]   HPI:  Chief Complaint: OBESITY Artemisa is here to discuss her progress with her obesity treatment plan. She is on the Category 1 plan and states she is following her eating plan approximately 70% of the time. She states she is walking 9,000 steps 7 times per week.  Delain is up 1 lb. She is taking Trokendi for some emotional eating. She has been busy. She reports doing okay with her water and protein intake.  Today's visit was #27 Starting weight: 180 lbs Starting date: 05/28/2018 Today's weight: 170 lbs Today's date: 08/19/2019 Total lbs lost to date: 10  Total lbs lost since last in-office visit: 0  Diabetes Mellitus type II Khrystyn has a diagnosis of diabetes mellitus type II and is taking Scientist, product/process development. Last A1c was 7.1 on 07/09/2019.  Other Depression Katelyne has emotional eating and is taking Trokendi.  ASSESSMENT AND PLAN:  Type 2 diabetes mellitus without complication, with long-term current use of insulin (HCC)  Other depression,with emotional eating  - Plan: Topiramate ER (TROKENDI XR) 50 MG CP24  Class 1 obesity with serious comorbidity and body mass index (BMI) of 32.0 to 32.9 in adult, unspecified obesity type  Other depression - with emotional eating  PLAN:  Diabetes II Taletha has been given diabetes education by myself today. Good blood sugar control is important to decrease the likelihood of diabetic complications such as nephropathy, neuropathy, limb loss, blindness, coronary artery disease, and death. Intensive lifestyle modification including diet, exercise and weight loss were discussed as the first line treatment for diabetes. Cherryl will continue her medications and follow-up as directed.  Emotional Eating Behaviors (other depression) Behavior modification techniques were discussed today to help Taiyari deal with her emotional/non-hunger eating behaviors. Claudette was given a prescription for Trokendi 50  mg 1 PO daily #30 with 0 refills and agrees to follow-up with our clinic in 3 weeks. Will continue to follow and monitor her progress.  Obesity Lastarr is currently in the action stage of change. As such, her goal is to continue with weight loss efforts. She has agreed to follow the Category 1 plan. Faynell will work on meal planning and keeping her protein and water intake high. Kemaria has been instructed to continue wearing her Fit Bit for weight loss and overall health benefits. We discussed the following Behavioral Modification Strategies today: increasing lean protein intake, decreasing simple carbohydrates, increasing vegetables, increase H20 intake, decrease eating out, no skipping meals, work on meal planning and easy cooking plans, keeping healthy foods in the home, and planning for success.  Krisalyn has agreed to follow-up with our clinic in 3 weeks. She was informed of the importance of frequent follow-up visits to maximize her success with intensive lifestyle modifications for her multiple health conditions.  ALLERGIES: Allergies  Allergen Reactions  . Erythromycin   . Epinephrine Palpitations  . Metformin And Related Rash    MEDICATIONS: Current Outpatient Medications on File Prior to Visit  Medication Sig Dispense Refill  . amitriptyline (ELAVIL) 25 MG tablet Take one to three (1-3) tablets (25 mg to 75 mg total) by mouth twice daily as needed for back pain.    Marland Kitchen aspirin EC 81 MG tablet Take 1 tablet (81 mg total) by mouth daily. 90 tablet 3  . BIOTIN PO Take by mouth.    Marland Kitchen CALCIUM PO Take by mouth daily. Reported on 02/24/2016    . cetirizine (ZYRTEC) 10 MG tablet Take 10 mg by  mouth daily.    . Cinnamon 500 MG capsule Take 500 mg by mouth daily.    . empagliflozin (JARDIANCE) 25 MG TABS tablet Take 25 mg by mouth daily.    Marland Kitchen ezetimibe (ZETIA) 10 MG tablet Take 1 tablet by mouth once daily 90 tablet 0  . fluticasone (FLONASE) 50 MCG/ACT nasal spray Place 2 sprays into the  nose daily.    Marland Kitchen ibuprofen (ADVIL,MOTRIN) 100 MG tablet Take 100 mg by mouth every 6 (six) hours as needed.      . Insulin Glargine (BASAGLAR KWIKPEN [place]) Inject 16 Units into the skin.     Marland Kitchen liraglutide (VICTOZA) 18 MG/3ML SOPN Inject 1.8 mg into the skin daily.    Marland Kitchen lisinopril (PRINIVIL,ZESTRIL) 2.5 MG tablet Take 2.5 mg by mouth daily.    . metoprolol (TOPROL-XL) 50 MG 24 hr tablet Take 25 mg by mouth daily.     . Multiple Vitamin (MULTIVITAMIN) tablet Take 1 tablet by mouth daily. Reported on 02/24/2016    . oxybutynin (DITROPAN-XL) 10 MG 24 hr tablet Take 10 mg by mouth at bedtime.    . rosuvastatin (CRESTOR) 10 MG tablet Take 10 mg by mouth once a week.     Marland Kitchen tiZANidine (ZANAFLEX) 4 MG tablet Take 4 mg by mouth every 6 (six) hours as needed for muscle spasms (back pain).    . Topiramate ER (TROKENDI XR) 50 MG CP24 Take 50 mg by mouth daily. 30 capsule 0  . varenicline (CHANTIX STARTING MONTH PAK) 0.5 MG X 11 & 1 MG X 42 tablet Take one 0.5 mg tablet by mouth once daily for 3 days, then increase to one 0.5 mg tablet twice daily for 4 days, then increase to one 1 mg tablet twice daily. 53 tablet 0  . VITAMIN D, CHOLECALCIFEROL, PO Take 2,000 mg by mouth daily.      No current facility-administered medications on file prior to visit.    PAST MEDICAL HISTORY: Past Medical History:  Diagnosis Date  . Arthritis   . Back pain   . Chronic kidney disease   . Congenital absence of left kidney   . Congenital absence of left ovary   . Coronary artery disease 2004   STATUS POST STENTING OF THE RIGHT CORONARY OSTIUM   . DDD (degenerative disc disease)   . Diabetes mellitus without complication ([place])   . Dislocated shoulder    left  . DVT (deep venous thrombosis) (HCC)    left posterior tibial vein  . High blood pressure   . Hypercholesterolemia   . Joint pain   . Osteoarthritis   . Peripheral vascular disease ([place])   . Sleep apnea   . Sleep apnea   . SOB (shortness of breath)   .  Tobacco dependence   . Vitamin D deficiency     PAST SURGICAL HISTORY: Past Surgical History:  Procedure Laterality Date  . ANGIOPLASTY  2004  . bone spur shoulder    . FOOT SURGERY     plantar fascitis  . KNEE SURGERY  1950  . TOTAL ABDOMINAL HYSTERECTOMY    . TUBAL LIGATION    . VEIN BYPASS SURGERY     2014    SOCIAL HISTORY: Social History   Tobacco Use  . Smoking status: Former Smoker    Packs/day: 1.00    Quit date: 10/27/2015    Years since quitting: 3.8  . Smokeless tobacco: Never Used  Substance Use Topics  . Alcohol use: No  Alcohol/week: 0.0 standard drinks  . Drug use: No    FAMILY HISTORY: Family History  Problem Relation Age of Onset  . Leukemia Sister   . Hypertension Brother   . Heart disease Father        before age 12  . High blood pressure Father   . Sudden death Father   . Breast cancer Neg Hx    ROS: ROS none noted.  PHYSICAL EXAM: Blood pressure 118/67, pulse 77, temperature 98 F (36.7 C), SpO2 98 %. There is no height or weight on file to calculate BMI. Physical Exam Vitals reviewed.  Constitutional:      Appearance: Normal appearance. She is obese.  Cardiovascular:     Rate and Rhythm: Normal rate.     Pulses: Normal pulses.  Pulmonary:     Effort: Pulmonary effort is normal.     Breath sounds: Normal breath sounds.  Musculoskeletal:        General: Normal range of motion.  Skin:    General: Skin is warm and dry.  Neurological:     Mental Status: She is alert and oriented to person, place, and time.  Psychiatric:        Behavior: Behavior normal.   RECENT LABS AND TESTS: BMET    Component Value Date/Time   NA 144 07/09/2019 0923   K 4.7 07/09/2019 0923   CL 107 (H) 07/09/2019 0923   CO2 24 07/09/2019 0923   GLUCOSE 112 (H) 07/09/2019 0923   GLUCOSE 115 (H) 10/14/2011 0931   BUN 15 07/09/2019 0923   CREATININE 0.97 07/09/2019 0923   CALCIUM 9.6 07/09/2019 0923   GFRNONAA 57 (L) 07/09/2019 0923   GFRAA 66  07/09/2019 0923   Lab Results  Component Value Date   HGBA1C 7.1 (H) 07/09/2019   HGBA1C 6.9 (H) 11/14/2018   No results found for: INSULIN CBC No results found for: WBC, RBC, HGB, HCT, PLT, MCV, MCH, MCHC, RDW, LYMPHSABS, MONOABS, EOSABS, BASOSABS Iron/TIBC/Ferritin/ %Sat No results found for: IRON, TIBC, FERRITIN, IRONPCTSAT Lipid Panel     Component Value Date/Time   CHOL 168 07/09/2019 0923   TRIG 186 (H) 07/09/2019 0923   HDL 46 07/09/2019 0923   CHOLHDL 2.9 12/20/2018 0850   CHOLHDL 3 10/14/2011 0931   VLDL 21.6 10/14/2011 0931   LDLCALC 90 07/09/2019 0923   Hepatic Function Panel     Component Value Date/Time   PROT 6.6 07/09/2019 0923   ALBUMIN 4.3 07/09/2019 0923   AST 21 07/09/2019 0923   ALT 20 07/09/2019 0923   ALKPHOS 73 07/09/2019 0923   BILITOT 0.3 07/09/2019 0923   BILIDIR 0.11 12/20/2018 0850      Component Value Date/Time   TSH 2.100 07/09/2019 0923    OBESITY BEHAVIORAL INTERVENTION VISIT DOCUMENTATION FOR INSURANCE (~15 minutes)  ASK: We discussed the diagnosis of obesity with [name] today and Fumie agreed to give [name] permission to discuss obesity behavioral modification therapy today.  ASSESS: Lillyanah has the diagnosis of obesity and her BMI today is 32.3. Elverna is in the action stage of change.   ADVISE: Cesilia was educated on the multiple health risks of obesity as well as the benefit of weight loss to improve her health. She was advised of the need for long term treatment and the importance of lifestyle modifications to improve her current health and to decrease her risk of future health problems.  AGREE: Multiple dietary modification options and treatment options were discussed and  Tylee agreed  to follow the recommendations documented in the above note.  ARRANGE: Aletse was educated on the importance of frequent visits to treat obesity as outlined per CMS and USPSTF guidelines and agreed to schedule her next follow up  appointment today.  Migdalia Dk, am acting as Location manager for [employer], DO  I have reviewed the above documentation for accuracy and completeness, and I agree with the above. -[name], DO

## 2019-08-20 ENCOUNTER — Encounter (INDEPENDENT_AMBULATORY_CARE_PROVIDER_SITE_OTHER): Payer: Self-pay | Admitting: Bariatrics

## 2019-08-20 DIAGNOSIS — R69 Illness, unspecified: Secondary | ICD-10-CM | POA: Diagnosis not present

## 2019-08-26 DIAGNOSIS — G4733 Obstructive sleep apnea (adult) (pediatric): Secondary | ICD-10-CM | POA: Diagnosis not present

## 2019-08-27 DIAGNOSIS — G4733 Obstructive sleep apnea (adult) (pediatric): Secondary | ICD-10-CM | POA: Diagnosis not present

## 2019-09-09 ENCOUNTER — Other Ambulatory Visit: Payer: Self-pay | Admitting: Cardiology

## 2019-09-12 ENCOUNTER — Ambulatory Visit (INDEPENDENT_AMBULATORY_CARE_PROVIDER_SITE_OTHER): Payer: Medicare HMO | Admitting: Bariatrics

## 2019-09-14 ENCOUNTER — Other Ambulatory Visit (INDEPENDENT_AMBULATORY_CARE_PROVIDER_SITE_OTHER): Payer: Self-pay | Admitting: Bariatrics

## 2019-09-14 DIAGNOSIS — F3289 Other specified depressive episodes: Secondary | ICD-10-CM

## 2019-09-18 ENCOUNTER — Ambulatory Visit: Payer: Medicare HMO

## 2019-09-18 ENCOUNTER — Encounter (HOSPITAL_COMMUNITY): Payer: Medicare HMO

## 2019-09-24 ENCOUNTER — Encounter (INDEPENDENT_AMBULATORY_CARE_PROVIDER_SITE_OTHER): Payer: Self-pay | Admitting: Bariatrics

## 2019-09-24 ENCOUNTER — Ambulatory Visit: Payer: Medicare HMO | Admitting: Cardiology

## 2019-09-24 ENCOUNTER — Ambulatory Visit (INDEPENDENT_AMBULATORY_CARE_PROVIDER_SITE_OTHER): Payer: PPO | Admitting: Bariatrics

## 2019-09-24 ENCOUNTER — Other Ambulatory Visit: Payer: Self-pay

## 2019-09-24 VITALS — BP 131/76 | HR 77 | Temp 97.7°F | Ht 61.0 in | Wt 168.0 lb

## 2019-09-24 DIAGNOSIS — E669 Obesity, unspecified: Secondary | ICD-10-CM

## 2019-09-24 DIAGNOSIS — F3289 Other specified depressive episodes: Secondary | ICD-10-CM

## 2019-09-24 DIAGNOSIS — Z6831 Body mass index (BMI) 31.0-31.9, adult: Secondary | ICD-10-CM

## 2019-09-24 DIAGNOSIS — E119 Type 2 diabetes mellitus without complications: Secondary | ICD-10-CM | POA: Diagnosis not present

## 2019-09-25 ENCOUNTER — Encounter (INDEPENDENT_AMBULATORY_CARE_PROVIDER_SITE_OTHER): Payer: Self-pay | Admitting: Bariatrics

## 2019-09-25 MED ORDER — TROKENDI XR 50 MG PO CP24: 1.0000 | ORAL_CAPSULE | Freq: Every day | ORAL | 0 refills | Status: DC

## 2019-09-25 NOTE — Progress Notes (Signed)
Chief Complaint:   Sandra [name] is here to discuss her progress with her obesity treatment plan along with follow-up of her obesity related diagnoses. Sandra [name] is on the Category 1 Plan and states she is following her eating plan approximately 80% of the time. Sandra [name] states she is walking 8,000 steps 7 times per week.  Today's visit was #: 28 Starting weight: 180 lbs Starting date: 05/28/2018 Today's weight: 168 lbs Today's date: 09/24/2019 Total lbs lost to date: 12 Total lbs lost since last in-office visit: 2  Interim History: Sandra [name] is down 2 lbs since her last visit. She will go to the beach, but will take good food and follow the plan.  Subjective:   Type 2 diabetes mellitus without complication, without long-term current use of insulin ([place]). Sandra [name] is taking Jardiance and another medication (she will bring at her next visit). She states fasting blood sugars range between 115 and 146.  Lab Results  Component Value Date   HGBA1C 7.1 (H) 07/09/2019   HGBA1C 6.9 (H) 11/14/2018   Lab Results  Component Value Date   LDLCALC 90 07/09/2019   CREATININE 0.97 07/09/2019   No results found for: INSULIN  Other depression,with emotional eating. Sandra [name] is struggling with emotional eating and using food for comfort to the extent that it is negatively impacting her health. She has been working on behavior modification techniques to help reduce her emotional eating and has been somewhat successful. She shows no sign of suicidal or homicidal ideations. Sandra [name] is on Trokendi, which is helpful for nighttime eating.  Assessment/Plan:   Type 2 diabetes mellitus without complication, without long-term current use of insulin ([place]). Good blood sugar control is important to decrease the likelihood of diabetic complications such as nephropathy, neuropathy, limb loss, blindness, coronary artery disease, and death. Intensive lifestyle modification including diet, exercise and weight loss  are the first line of treatment for diabetes. Sandra [name] will decrease carbohydrates, increase protein and healthy fats, and continue her medications.  Other depression,with emotional eating. Behavior modification techniques were discussed today to help Sandra [name] deal with her emotional/non-hunger eating behaviors.  Orders and follow up as documented in patient record. Prescription was given for Topiramate ER (TROKENDI XR) 50 MG CP24 1 PO daily #30 with 0 refills.  Class 1 obesity with serious comorbidity and body mass index (BMI) of 31.0 to 31.9 in adult, unspecified obesity type.  Sandra [name] is currently in the action stage of change. As such, her goal is to continue with weight loss efforts. She has agreed to the Category 1 Plan.   She will work on meal planning and intentional eating.  Exercise goals: Sandra [name] will continue to walk 8,000 steps and using small weights.  Behavioral modification strategies: increasing lean protein intake, decreasing simple carbohydrates, increasing vegetables, increasing water intake, decreasing eating out, no skipping meals, meal planning and cooking strategies, keeping healthy foods in the home and planning for success.  Sandra [name] has agreed to follow-up with our clinic in 2 weeks. She was informed of the importance of frequent follow-up visits to maximize her success with intensive lifestyle modifications for her multiple health conditions.   Objective:   Blood pressure 131/76, pulse 77, temperature 97.7 F (36.5 C), temperature source Oral, height 5\' 1"  (1.549 m), weight 168 lb (76.2 kg), SpO2 95 %. Body mass index is 31.74 kg/m.  General: Cooperative, alert, well developed, in no acute distress. HEENT: Conjunctivae and lids unremarkable. Cardiovascular: Regular rhythm.  Lungs: Normal work of breathing.  Neurologic: No focal deficits.   Lab Results  Component Value Date   CREATININE 0.97 07/09/2019   BUN 15 07/09/2019   NA 144 07/09/2019   K 4.7 07/09/2019    CL 107 (H) 07/09/2019   CO2 24 07/09/2019   Lab Results  Component Value Date   ALT 20 07/09/2019   AST 21 07/09/2019   ALKPHOS 73 07/09/2019   BILITOT 0.3 07/09/2019   Lab Results  Component Value Date   HGBA1C 7.1 (H) 07/09/2019   HGBA1C 6.9 (H) 11/14/2018   No results found for: INSULIN Lab Results  Component Value Date   TSH 2.100 07/09/2019   Lab Results  Component Value Date   CHOL 168 07/09/2019   HDL 46 07/09/2019   LDLCALC 90 07/09/2019   TRIG 186 (H) 07/09/2019   CHOLHDL 2.9 12/20/2018   No results found for: WBC, HGB, HCT, MCV, PLT No results found for: IRON, TIBC, FERRITIN  Obesity Behavioral Intervention Documentation for Insurance:   Approximately 15 minutes were spent on the discussion below.  ASK: We discussed the diagnosis of obesity with Sandra [name] today and Sandra [name] agreed to give [name] permission to discuss obesity behavioral modification therapy today.  ASSESS: Sandra [name] has the diagnosis of obesity and her BMI today is 31.8. Sandra [name] is in the action stage of change.   ADVISE: Sandra [name] was educated on the multiple health risks of obesity as well as the benefit of weight loss to improve her health. She was advised of the need for long term treatment and the importance of lifestyle modifications to improve her current health and to decrease her risk of future health problems.  AGREE: Multiple dietary modification options and treatment options were discussed and Sandra [name] agreed to follow the recommendations documented in the above note.  ARRANGE: Sandra [name] was educated on the importance of frequent visits to treat obesity as outlined per CMS and USPSTF guidelines and agreed to schedule her next follow up appointment today.  Attestation Statements:   Reviewed by clinician on day of visit: allergies, medications, problem list, medical history, surgical history, family history, social history, and previous encounter notes.  Migdalia Dk, am acting as  Location manager for [employer], DO   I have reviewed the above documentation for accuracy and completeness, and I agree with the above. [name], DO

## 2019-10-01 NOTE — Progress Notes (Signed)
HISTORY AND PHYSICAL     CC:  follow up. Requesting Provider:  Cari Caraway, MD  HPI: This is a 74 y.o. female here for follow up for carotid artery stenosis.  Pt has hx of carotid disease who Dr. Scot Dock saw in consult in February 2020.  At that time, a carotid duplex revealed a 60-79% left ICA stenosis and less than 39% right ICA stenosis.  She was asymptomatic.    Pt was last seen June 2020 by Dr. Scot Dock and at that time, she remained asymptomatic.  She was taking asa/statin.   She did have some complaints of varicose veins in the right leg and described aching pain and heaviness in her legs, which was aggravated by sitting and relieved with standing.  She is currently in a weight loss program.  She states that she has about 12lbs to go.    She comes in today for follow up.  She states that she is doing well except for her chronic back pain.  She denies any weakness or numbness in any extremity, clumsiness, speech difficulties or amaurosis fugax.    She states she continues to have chronic back pain and has received a couple of ESI without relief.  She is using ice/heat/meds for pain relief.   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    The pt is on a statin for cholesterol management.  The pt is on a daily aspirin.   Other AC:  none The pt is on ACEI, BB for hypertension.   The pt is diabetic.   Tobacco hx:  Remote-quit 2017   Past Medical History:  Diagnosis Date  . Arthritis   . Back pain   . Chronic kidney disease   . Congenital absence of left kidney   . Congenital absence of left ovary   . Coronary artery disease 2004   STATUS POST STENTING OF THE RIGHT CORONARY OSTIUM   . DDD (degenerative disc disease)   . Diabetes mellitus without complication ([place])   . Dislocated shoulder    left  . DVT (deep venous thrombosis) ([facility])    left posterior tibial vein  . High blood pressure   . Hypercholesterolemia   . Joint pain   . Osteoarthritis   . Peripheral  vascular disease ([place])   . Sleep apnea   . Sleep apnea   . SOB (shortness of breath)   . Tobacco dependence   . Vitamin D deficiency     Past Surgical History:  Procedure Laterality Date  . ANGIOPLASTY  2004  . bone spur shoulder    . FOOT SURGERY     plantar fascitis  . KNEE SURGERY  1950  . TOTAL ABDOMINAL HYSTERECTOMY    . TUBAL LIGATION    . VEIN BYPASS SURGERY     2014    Allergies  Allergen Reactions  . Erythromycin   . Epinephrine Palpitations  . Metformin And Related Rash    Current Outpatient Medications  Medication Sig Dispense Refill  . amitriptyline (ELAVIL) 25 MG tablet Take one to three (1-3) tablets (25 mg to 75 mg total) by mouth twice daily as needed for back pain.    Marland Kitchen aspirin EC 81 MG tablet Take 1 tablet (81 mg total) by mouth daily. 90 tablet 3  . BIOTIN PO Take by mouth.    Marland Kitchen CALCIUM PO Take by mouth daily. Reported on 02/24/2016    . cetirizine (ZYRTEC) 10 MG tablet Take 10 mg by mouth daily.    Marland Kitchen  Cinnamon 500 MG capsule Take 500 mg by mouth daily.    . empagliflozin (JARDIANCE) 25 MG TABS tablet Take 25 mg by mouth daily.    Marland Kitchen ezetimibe (ZETIA) 10 MG tablet Take 1 tablet (10 mg total) by mouth daily. NEEDS OV FOR FUTURE REFILL 90 tablet 0  . fluticasone (FLONASE) 50 MCG/ACT nasal spray Place 2 sprays into the nose daily.    Marland Kitchen ibuprofen (ADVIL,MOTRIN) 100 MG tablet Take 100 mg by mouth every 6 (six) hours as needed.      Marland Kitchen lisinopril (PRINIVIL,ZESTRIL) 2.5 MG tablet Take 2.5 mg by mouth daily.    . metoprolol (TOPROL-XL) 50 MG 24 hr tablet Take 25 mg by mouth daily.     . Multiple Vitamin (MULTIVITAMIN) tablet Take 1 tablet by mouth daily. Reported on 02/24/2016    . oxybutynin (DITROPAN-XL) 10 MG 24 hr tablet Take 10 mg by mouth at bedtime.    . rosuvastatin (CRESTOR) 10 MG tablet Take 10 mg by mouth once a week.     . Topiramate ER (TROKENDI XR) 50 MG CP24 Take 1 capsule by mouth daily. 30 capsule 0  . VITAMIN D, CHOLECALCIFEROL, PO Take 2,000 mg  by mouth daily.      No current facility-administered medications for this visit.    Family History  Problem Relation Age of Onset  . Leukemia Sister   . Hypertension Brother   . Heart disease Father        before age 61  . High blood pressure Father   . Sudden death Father   . Breast cancer Neg Hx     Social History   Socioeconomic History  . Marital status: Widowed    Spouse name: Not on file  . Number of children: 2  . Years of education: Not on file  . Highest education level: Not on file  Occupational History  . Occupation: Producer, television/film/video: RETIRED    Comment: retired  Tobacco Use  . Smoking status: Former Smoker    Packs/day: 1.00    Quit date: 10/27/2015    Years since quitting: 3.9  . Smokeless tobacco: Never Used  Substance and Sexual Activity  . Alcohol use: No    Alcohol/week: 0.0 standard drinks  . Drug use: No  . Sexual activity: Not on file  Other Topics Concern  . Not on file  Social History Narrative  . Not on file   Social Determinants of Health   Financial Resource Strain:   . Difficulty of Paying Living Expenses: Not on file  Food Insecurity:   . Worried About Charity fundraiser in the Last Year: Not on file  . Ran Out of Food in the Last Year: Not on file  Transportation Needs:   . Lack of Transportation (Medical): Not on file  . Lack of Transportation (Non-Medical): Not on file  Physical Activity:   . Days of Exercise per Week: Not on file  . Minutes of Exercise per Session: Not on file  Stress:   . Feeling of Stress : Not on file  Social Connections:   . Frequency of Communication with Friends and Family: Not on file  . Frequency of Social Gatherings with Friends and Family: Not on file  . Attends Religious Services: Not on file  . Active Member of Clubs or Organizations: Not on file  . Attends Archivist Meetings: Not on file  . Marital Status: Not on file  Intimate Partner Violence:   .  Fear of Current or  Ex-Partner: Not on file  . Emotionally Abused: Not on file  . Physically Abused: Not on file  . Sexually Abused: Not on file     REVIEW OF SYSTEMS:   [X]  denotes positive finding, [ ]  denotes negative finding Cardiac  Comments:  Chest pain or chest pressure:    Shortness of breath upon exertion:    Short of breath when lying flat:    Irregular heart rhythm:        Vascular    Pain in calf, thigh, or hip brought on by ambulation:    Pain in feet at night that wakes you up from your sleep:     Blood clot in your veins:    Leg swelling:         Pulmonary    Oxygen at home:    Productive cough:     Wheezing:         Neurologic    Sudden weakness in arms or legs:     Sudden numbness in arms or legs:     Sudden onset of difficulty speaking or slurred speech:    Temporary loss of vision in one eye:     Problems with dizziness:         Gastrointestinal    Blood in stool:     Vomited blood:         Genitourinary    Burning when urinating:     Blood in urine:        Psychiatric    Major depression:         Hematologic    Bleeding problems:    Problems with blood clotting too easily:        Skin    Rashes or ulcers:        Constitutional    Fever or chills:      PHYSICAL EXAMINATION:  Today's Vitals   10/03/19 1437 10/03/19 1438  BP: 94/63 103/60  Pulse: 89   Resp: 16   Temp: (!) 97.2 F (36.2 C)   TempSrc: Temporal   SpO2: 99%   Weight: 175 lb (79.4 kg)   Height: 5\' 1"  (1.549 m)    Body mass index is 33.07 kg/m.   General:  WDWN in NAD; vital signs documented above Gait: Normal HENT: WNL, normocephalic Pulmonary: normal non-labored breathing , without Rales, rhonchi,  wheezing Cardiac: regular HR, without  Murmurs, rubs or gallops; with harsh carotid bruit on the right and soft carotid bruit on the left.  Abdomen: soft, NT, no masses Skin: without rashes Vascular Exam/Pulses:  Right Left  Radial 2+ (normal) 2+ (normal)  Ulnar 1+ (weak) 2+  (normal)   Extremities: without ischemic changes, without Gangrene , without cellulitis; without open wounds;  Musculoskeletal: no muscle wasting or atrophy  Neurologic: A&O X 3 Psychiatric:  The pt has Normal affect.   Non-Invasive Vascular Imaging:   Carotid Duplex on 10/03/2019: Right:  40-59% ICA stenosis Left:  60-79% ICA stenosis Vertebrals: Bilateral vertebral arteries demonstrate antegrade flow.  Subclavians: Right subclavian artery was stenotic. Normal flow  hemodynamics were seen in the left subclavian artery.  Previous Carotid duplex on 02/06/2019: Right: 1-39% ICA stenosis Left:   60-79% ICA stenosis Vertebrals:  Bilateral vertebral arteries demonstrate antegrade flow. Subclavians: Normal flow hemodynamics were seen in bilateral subclavian arteries   ASSESSMENT/PLAN:: 74 y.o. female here for follow up carotid artery stenosis.  -the right ICA stenosis has progressed but is at the low end  of 40-59%.  The EDV with minimal progression from 34 to 42cm/s.  The left ICA stenosis remains in the 60-79% with EDV going only from 85 to 90cm/s.  -she remains asymptomatic. -continue asa/statin -stressed the importance of not smoking and progression of PAD with smoking as she states that she is not smoking right now.  She has smoked in the past and quit with Chantix.   -she will f/u in one year with carotid duplex.  -discussed s/s of stroke with pt and they understand should they develop any of these sx, they will go to the nearest ER.   Leontine Locket, PA-C [facility] [phone]  Clinic MD:  Oneida Alar

## 2019-10-02 ENCOUNTER — Telehealth (HOSPITAL_COMMUNITY): Payer: Self-pay

## 2019-10-02 ENCOUNTER — Other Ambulatory Visit: Payer: Self-pay

## 2019-10-02 DIAGNOSIS — I6522 Occlusion and stenosis of left carotid artery: Secondary | ICD-10-CM

## 2019-10-02 DIAGNOSIS — R0989 Other specified symptoms and signs involving the circulatory and respiratory systems: Secondary | ICD-10-CM

## 2019-10-02 NOTE — Telephone Encounter (Signed)

## 2019-10-03 ENCOUNTER — Ambulatory Visit (HOSPITAL_COMMUNITY)
Admission: RE | Admit: 2019-10-03 | Discharge: 2019-10-03 | Disposition: A | Discharge status: Home / Self Care | Payer: PPO | Source: Ambulatory Visit | Attending: Vascular Surgery | Admitting: Vascular Surgery

## 2019-10-03 ENCOUNTER — Other Ambulatory Visit: Payer: Self-pay

## 2019-10-03 ENCOUNTER — Ambulatory Visit (INDEPENDENT_AMBULATORY_CARE_PROVIDER_SITE_OTHER): Payer: PPO | Admitting: Physician Assistant

## 2019-10-03 ENCOUNTER — Ambulatory Visit: Payer: Self-pay

## 2019-10-03 VITALS — BP 103/60 | HR 89 | Temp 97.2°F | Resp 16 | Ht 61.0 in | Wt 175.0 lb

## 2019-10-03 DIAGNOSIS — E1165 Type 2 diabetes mellitus with hyperglycemia: Secondary | ICD-10-CM | POA: Diagnosis not present

## 2019-10-03 DIAGNOSIS — I2510 Atherosclerotic heart disease of native coronary artery without angina pectoris: Secondary | ICD-10-CM | POA: Diagnosis not present

## 2019-10-03 DIAGNOSIS — E782 Mixed hyperlipidemia: Secondary | ICD-10-CM | POA: Diagnosis not present

## 2019-10-03 DIAGNOSIS — I6522 Occlusion and stenosis of left carotid artery: Secondary | ICD-10-CM | POA: Diagnosis not present

## 2019-10-03 DIAGNOSIS — I6523 Occlusion and stenosis of bilateral carotid arteries: Secondary | ICD-10-CM | POA: Diagnosis not present

## 2019-10-03 DIAGNOSIS — I10 Essential (primary) hypertension: Secondary | ICD-10-CM | POA: Diagnosis not present

## 2019-10-03 DIAGNOSIS — R0989 Other specified symptoms and signs involving the circulatory and respiratory systems: Secondary | ICD-10-CM

## 2019-10-03 DIAGNOSIS — M159 Polyosteoarthritis, unspecified: Secondary | ICD-10-CM | POA: Diagnosis not present

## 2019-10-03 DIAGNOSIS — E1159 Type 2 diabetes mellitus with other circulatory complications: Secondary | ICD-10-CM | POA: Diagnosis not present

## 2019-10-04 ENCOUNTER — Other Ambulatory Visit: Payer: Self-pay | Admitting: *Deleted

## 2019-10-04 ENCOUNTER — Ambulatory Visit: Payer: Medicare HMO

## 2019-10-04 DIAGNOSIS — I6523 Occlusion and stenosis of bilateral carotid arteries: Secondary | ICD-10-CM

## 2019-10-10 ENCOUNTER — Other Ambulatory Visit: Payer: Self-pay

## 2019-10-10 ENCOUNTER — Ambulatory Visit (INDEPENDENT_AMBULATORY_CARE_PROVIDER_SITE_OTHER): Payer: PPO | Admitting: Bariatrics

## 2019-10-10 ENCOUNTER — Encounter (INDEPENDENT_AMBULATORY_CARE_PROVIDER_SITE_OTHER): Payer: Self-pay | Admitting: Bariatrics

## 2019-10-10 VITALS — BP 117/70 | HR 79 | Temp 97.9°F | Ht 61.0 in | Wt 171.0 lb

## 2019-10-10 DIAGNOSIS — I10 Essential (primary) hypertension: Secondary | ICD-10-CM | POA: Diagnosis not present

## 2019-10-10 DIAGNOSIS — E669 Obesity, unspecified: Secondary | ICD-10-CM

## 2019-10-10 DIAGNOSIS — G4733 Obstructive sleep apnea (adult) (pediatric): Secondary | ICD-10-CM

## 2019-10-10 DIAGNOSIS — E66811 Obesity, class 1: Secondary | ICD-10-CM

## 2019-10-10 DIAGNOSIS — E119 Type 2 diabetes mellitus without complications: Secondary | ICD-10-CM | POA: Diagnosis not present

## 2019-10-10 DIAGNOSIS — Z6832 Body mass index (BMI) 32.0-32.9, adult: Secondary | ICD-10-CM | POA: Diagnosis not present

## 2019-10-10 NOTE — Progress Notes (Signed)
Chief Complaint:   Sandra [name] is here to discuss her progress with her obesity treatment plan along with follow-up of her obesity related diagnoses. Sandra [name] is on the Category 1 Plan and states she is following her eating plan approximately 60% of the time. Sandra [name] states she is walking 3,000-4,000 steps 7 times per week.  Today's visit was #: 59 Starting weight: 180 lbs Starting date: 05/28/2018 Today's weight: 171 lbs Today's date: 10/10/2019 Total lbs lost to date: 9 Total lbs lost since last in-office visit: 0  Interim History: Sandra [name] is up 3 lbs, but has done relatively well over time. She has been under more stress. She is doing well with her water and protein.  Subjective:   Type 2 diabetes mellitus without complication, without long-term current use of insulin ([place]). Sandra [name] is taking [place]. She states fasting blood sugars range between 140 and 150's.  Lab Results  Component Value Date   HGBA1C 7.1 (H) 07/09/2019   HGBA1C 6.9 (H) 11/14/2018   Lab Results  Component Value Date   LDLCALC 90 07/09/2019   CREATININE 0.97 07/09/2019   No results found for: INSULIN  Obstructive sleep apnea syndrome. Sandra [name] has a diagnosis of sleep apnea. She reports feeling well rested.  Essential hypertension. Sandra [name] is taking lisinopril and Toprol. Blood pressure is controlled.  BP Readings from Last 3 Encounters:  10/10/19 117/70  10/03/19 103/60  09/24/19 131/76   Lab Results  Component Value Date   CREATININE 0.97 07/09/2019   CREATININE 1.09 (H) 11/14/2018   CREATININE 0.87 05/28/2018   Assessment/Plan:   Type 2 diabetes mellitus without complication, without long-term current use of insulin ([place]). Good blood sugar control is important to decrease the likelihood of diabetic complications such as nephropathy, neuropathy, limb loss, blindness, coronary artery disease, and death. Intensive lifestyle modification including diet, exercise and weight loss are  the first line of treatment for diabetes. Sandra [name] will continue her medications as prescribed.  Obstructive sleep apnea syndrome. Intensive lifestyle modifications are the first line treatment for this issue. We discussed several lifestyle modifications today and she will continue to work on diet, exercise and weight loss efforts. We will continue to monitor. Orders and follow up as documented in patient record. Sandra [name] will continue to wear her CPAP nightly,  Counseling  Sleep apnea is a condition in which breathing pauses or becomes shallow during sleep. This happens over and over during the night. This disrupts your sleep and keeps your body from getting the rest that it needs, which can cause tiredness and lack of energy (fatigue) during the day.  Sleep apnea treatment: If you were given a device to open your airway while you sleep, USE IT!  Sleep hygiene:   Limit or avoid alcohol, caffeinated beverages, and cigarettes, especially close to bedtime.   Do not eat a large meal or eat spicy foods right before bedtime. This can lead to digestive discomfort that can make it hard for you to sleep.  Keep a sleep diary to help you and your health care provider figure out what could be causing your insomnia.  . Make your bedroom a dark, comfortable place where it is easy to fall asleep. ? Put up shades or blackout curtains to block light from outside. ? Use a white noise machine to block noise. ? Keep the temperature cool. . Limit screen use before bedtime. This includes: ? Watching TV. ? Using your smartphone, tablet, or computer. . Stick to a  routine that includes going to bed and waking up at the same times every day and night. This can help you fall asleep faster. Consider making a quiet activity, such as reading, part of your nighttime routine. . Try to avoid taking naps during the day so that you sleep better at night. . Get out of bed if you are still awake after 15 minutes of trying to sleep.  Keep the lights down, but try reading or doing a quiet activity. When you feel sleepy, go back to bed.  Essential hypertension. Sandra [name] is working on healthy weight loss and exercise to improve blood pressure control. We will watch for signs of hypotension as she continues her lifestyle modifications. She will continue her medications as prescribed and decrease or eliminate salt in her diet.  Class 1 obesity with serious comorbidity and body mass index (BMI) of 32.0 to 32.9 in adult, unspecified obesity type.  Sandra [name] is currently in the action stage of change. As such, her goal is to continue with weight loss efforts. She has agreed to the Category 1 Plan.   She will work on meal planning, intentional eating, and will decrease sweets/carbohydrates.  Exercise goals: Sandra [name] is walking most days and will go back with her personal trainer (TRX) machines.  Behavioral modification strategies: increasing lean protein intake, decreasing simple carbohydrates, increasing vegetables, increasing water intake, decreasing eating out, no skipping meals, meal planning and cooking strategies and keeping healthy foods in the home.  Sandra [name] has agreed to follow-up with our clinic in 2 weeks. She was informed of the importance of frequent follow-up visits to maximize her success with intensive lifestyle modifications for her multiple health conditions.   Objective:   Blood pressure 117/70, pulse 79, temperature 97.9 F (36.6 C), height 5\' 1"  (1.549 m), weight 171 lb (77.6 kg), SpO2 95 %. Body mass index is 32.31 kg/m.  General: Cooperative, alert, well developed, in no acute distress. HEENT: Conjunctivae and lids unremarkable. Cardiovascular: Regular rhythm.  Lungs: Normal work of breathing. Neurologic: No focal deficits.   Lab Results  Component Value Date   CREATININE 0.97 07/09/2019   BUN 15 07/09/2019   NA 144 07/09/2019   K 4.7 07/09/2019   CL 107 (H) 07/09/2019   CO2 24 07/09/2019   Lab Results    Component Value Date   ALT 20 07/09/2019   AST 21 07/09/2019   ALKPHOS 73 07/09/2019   BILITOT 0.3 07/09/2019   Lab Results  Component Value Date   HGBA1C 7.1 (H) 07/09/2019   HGBA1C 6.9 (H) 11/14/2018   No results found for: INSULIN Lab Results  Component Value Date   TSH 2.100 07/09/2019   Lab Results  Component Value Date   CHOL 168 07/09/2019   HDL 46 07/09/2019   LDLCALC 90 07/09/2019   TRIG 186 (H) 07/09/2019   CHOLHDL 2.9 12/20/2018   No results found for: WBC, HGB, HCT, MCV, PLT No results found for: IRON, TIBC, FERRITIN  Attestation Statements:   Reviewed by clinician on day of visit: allergies, medications, problem list, medical history, surgical history, family history, social history, and previous encounter notes.  Time spent on visit including pre-visit chart review and post-visit care was 20 minutes.   Migdalia Dk, am acting as Location manager for [employer], DO   I have reviewed the above documentation for accuracy and completeness, and I agree with the above. [name], DO

## 2019-10-11 ENCOUNTER — Ambulatory Visit: Payer: PPO | Attending: Internal Medicine

## 2019-10-11 DIAGNOSIS — Z23 Encounter for immunization: Secondary | ICD-10-CM | POA: Insufficient documentation

## 2019-10-11 NOTE — Progress Notes (Signed)
   Covid-19 Vaccination Clinic  Name:  Sandra [name]    MRN: [id number] DOB: [birth year]  10/11/2019  [name] was observed post Covid-19 immunization for 15 minutes without incidence. She was provided with Vaccine Information Sheet and instruction to access the V-Safe system.   [name] was instructed to call 911 with any severe reactions post vaccine: Marland Kitchen Difficulty breathing  . Swelling of your face and throat  . A fast heartbeat  . A bad rash all over your body  . Dizziness and weakness    Immunizations Administered    Name Date Dose VIS Date Route   Pfizer COVID-19 Vaccine 10/11/2019  6:10 PM 0.3 mL 08/16/2019 Intramuscular   Manufacturer: [place]   Lot: [id number]   [place]: [id number]

## 2019-10-12 NOTE — Progress Notes (Signed)
Sandra [name] Date of Birth: Jul 19, 1946   History of Present Illness: Sandra [name] is seen for followup CAD. She was last seen in Aug 2017. She has a history of coronary disease and is status post stenting of the ostium of the right coronary in 2004. She has a history of hypercholesterolemia and tobacco abuse. She has DM on insulin. She has an occluded IVC noted incidentally on an MRI and CT scans. 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 She has a history of DVT involving the left posterior tibial vein. This occurred after receiving treatments of injections and laser therapy for varicose veins.   She did quit smoking before but states she still smokes occasionally.  Her last Myoview in in 2017 was normal.  On follow up today she is doing well from a cardiac standpoint. She denies any chest pain or dyspnea. No palpitations. She is walking daily. She was having a lot of myalgias on Crestor. Now only taking once a week. She did have recent carotid dopplers in January  showing 60-79% left ICA stenosis, 40-59% RICA stenosis.   Current Outpatient Medications on File Prior to Visit  Medication Sig Dispense Refill  . aspirin EC 81 MG tablet Take 1 tablet (81 mg total) by mouth daily. 90 tablet 3  . BIOTIN PO Take by mouth.    Marland Kitchen CALCIUM PO Take by mouth daily. Reported on 02/24/2016    . cetirizine (ZYRTEC) 10 MG tablet Take 10 mg by mouth daily.    . Cinnamon 500 MG capsule Take 500 mg by mouth daily.    Marland Kitchen co-enzyme Q-10 30 MG capsule Take 30 mg by mouth 3 (three) times daily.    . empagliflozin (JARDIANCE) 25 MG TABS tablet Take 25 mg by mouth daily.    Marland Kitchen ezetimibe (ZETIA) 10 MG tablet Take 1 tablet (10 mg total) by mouth daily. NEEDS OV FOR FUTURE REFILL 90 tablet 0  . Fexofenadine HCl (MUCINEX ALLERGY PO) Take by mouth.    . fluticasone (FLONASE) 50 MCG/ACT nasal spray Place 2 sprays into the nose daily.    Marland Kitchen ibuprofen (ADVIL,MOTRIN) 100 MG tablet Take 100 mg by mouth every  6 (six) hours as needed.      Marland Kitchen lisinopril (PRINIVIL,ZESTRIL) 2.5 MG tablet Take 2.5 mg by mouth daily.    . metoprolol (TOPROL-XL) 50 MG 24 hr tablet Take 25 mg by mouth daily.     Marland Kitchen oxybutynin (DITROPAN-XL) 10 MG 24 hr tablet Take 10 mg by mouth at bedtime.    . rosuvastatin (CRESTOR) 10 MG tablet Take 10 mg by mouth once a week.     . Topiramate ER (TROKENDI XR) 50 MG CP24 Take 1 capsule by mouth daily. 30 capsule 0  . XULTOPHY 100-3.6 UNIT-MG/ML SOPN SMARTSIG:16 Unit(s) SUB-Q Daily    . amitriptyline (ELAVIL) 25 MG tablet Take one to three (1-3) tablets (25 mg to 75 mg total) by mouth twice daily as needed for back pain.    Marland Kitchen VITAMIN D, CHOLECALCIFEROL, PO Take 2,000 mg by mouth daily.      No current facility-administered medications on file prior to visit.    Allergies  Allergen Reactions  . Erythromycin   . Epinephrine Palpitations  . Metformin And Related Rash    Past Medical History:  Diagnosis Date  . Arthritis   . Back pain   . Chronic kidney disease   . Congenital absence of inferior vena cava   . Congenital  absence of left kidney   . Congenital absence of left ovary   . Coronary artery disease 2004   STATUS POST STENTING OF THE RIGHT CORONARY OSTIUM   . DDD (degenerative disc disease)   . Diabetes mellitus without complication ([place])   . Dislocated shoulder    left  . DVT (deep venous thrombosis) ([place]) 2014   left posterior tibial vein  . High blood pressure   . Hypercholesterolemia   . Joint pain   . Osteoarthritis   . Peripheral vascular disease ([place])   . Sleep apnea   . Sleep apnea   . SOB (shortness of breath)   . Tobacco dependence   . Vitamin D deficiency     Past Surgical History:  Procedure Laterality Date  . ANGIOPLASTY  2004  . bone spur shoulder    . FOOT SURGERY     plantar fascitis  . KNEE SURGERY  1950  . TOTAL ABDOMINAL HYSTERECTOMY    . TUBAL LIGATION    . VEIN BYPASS SURGERY     2014    Social History   Tobacco Use  Smoking  Status Former Smoker  . Packs/day: 1.00  . Quit date: 10/27/2015  . Years since quitting: 3.9  Smokeless Tobacco Never Used    Social History   Substance and Sexual Activity  Alcohol Use No  . Alcohol/week: 0.0 standard drinks    Family History  Problem Relation Age of Onset  . Leukemia Sister   . Hypertension Brother   . Heart disease Father        before age 47  . High blood pressure Father   . Sudden death Father   . Breast cancer Neg Hx     Review of Systems: As noted in history of present illness.  All other systems were reviewed and are negative.  Physical Exam: BP 118/78   Pulse 81   Temp (!) 97.1 F (36.2 C)   Ht 5\' 3"  (1.6 m)   Wt 173 lb 3.2 oz (78.6 kg)   SpO2 95%   BMI 30.68 kg/m  GENERAL:  Well appearing obese WF in NAD HEENT:  PERRL, EOMI, sclera are clear. Oropharynx is clear. NECK:  No jugular venous distention, carotid upstroke brisk and symmetric, soft left carotid  bruit, no thyromegaly or adenopathy LUNGS:  Clear to auscultation bilaterally CHEST:  Unremarkable HEART:  RRR,  PMI not displaced or sustained,S1 and S2 within normal limits, no S3, no S4: no clicks, no rubs, no murmurs ABD:  Soft, nontender. BS +, no masses or bruits. No hepatomegaly, no splenomegaly EXT:  2 + pulses throughout, no edema, no cyanosis no clubbing SKIN:  Warm and dry.  No rashes NEURO:  Alert and oriented x 3. Cranial nerves II through XII intact. PSYCH:  Cognitively intact    LABORATORY DATA:  Lab Results  Component Value Date   GLUCOSE 112 (H) 07/09/2019   CHOL 168 07/09/2019   TRIG 186 (H) 07/09/2019   HDL 46 07/09/2019   LDLCALC 90 07/09/2019   ALT 20 07/09/2019   AST 21 07/09/2019   NA 144 07/09/2019   K 4.7 07/09/2019   CL 107 (H) 07/09/2019   CREATININE 0.97 07/09/2019   BUN 15 07/09/2019   CO2 24 07/09/2019   TSH 2.100 07/09/2019   HGBA1C 7.1 (H) 07/09/2019    ECG today demonstrates normal sinus rhythm with  LAD. Rate 81.   I have personally  reviewed and interpreted this study.  Labs reviewed  from primary care September 2016. Normal CMET. Cholesterol 135, trig- 247, HDL 31, LDL 55.  Dated 07/11/18: cholesterol 150, triglycerides 136, HDL 38, LDL 85.  Dated 08/17/18: A1c 6.7%. CBC and chemistries normal.   Myoview 05/17/16: Study Highlights     The left ventricular ejection fraction is hyperdynamic (>65%).  Nuclear stress EF: 68%.  There was no ST segment deviation noted during stress.  The study is normal.  This is a low risk study.   Normal resting and stress perfusion. No ischemia or infarction EF 68%     Assessment / Plan: 1. Coronary disease with remote stenting of the ostium of the right coronary. Last Myoview in 2017 was normal.  Continue  ASA  81 mg daily. Continue Toprol. Needs aggressive risk factor modification  2. Tobacco dependence. I've encouraged her with her efforts at complete smoking cessation.  3. Left carotid stenosis. Moderate. Asymptomatic. Stable by recent dopplers.   4. Occluded IVC. Congenital.  5. HTN controlled. Continue therapy  6. Dyslipidemia. On maximal tolerated statin once a week and  Zetia 10 mg daily. LDL still is not at goal at 90.  Goal LDL < 70. Recommend she start a PCSK 9 inhibitor. Will have her see Pharm D  7. DM type 2 on insulin. Per primary care.   8. Morbid obesity. Encouraged efforts at weight loss program.  Follow up in one year

## 2019-10-14 ENCOUNTER — Other Ambulatory Visit: Payer: Self-pay

## 2019-10-14 ENCOUNTER — Ambulatory Visit: Payer: PPO | Admitting: Cardiology

## 2019-10-14 ENCOUNTER — Encounter: Payer: Self-pay | Admitting: Cardiology

## 2019-10-14 VITALS — BP 118/78 | HR 81 | Temp 97.1°F | Ht 63.0 in | Wt 173.2 lb

## 2019-10-14 DIAGNOSIS — Z794 Long term (current) use of insulin: Secondary | ICD-10-CM | POA: Diagnosis not present

## 2019-10-14 DIAGNOSIS — I6522 Occlusion and stenosis of left carotid artery: Secondary | ICD-10-CM | POA: Diagnosis not present

## 2019-10-14 DIAGNOSIS — E7800 Pure hypercholesterolemia, unspecified: Secondary | ICD-10-CM | POA: Diagnosis not present

## 2019-10-14 DIAGNOSIS — E119 Type 2 diabetes mellitus without complications: Secondary | ICD-10-CM | POA: Diagnosis not present

## 2019-10-14 DIAGNOSIS — I25118 Atherosclerotic heart disease of native coronary artery with other forms of angina pectoris: Secondary | ICD-10-CM

## 2019-10-14 DIAGNOSIS — I10 Essential (primary) hypertension: Secondary | ICD-10-CM

## 2019-10-14 NOTE — Patient Instructions (Signed)
Praluent or Repatha

## 2019-10-14 NOTE — Addendum Note (Signed)
Addended by: Kathyrn Lass on: 10/14/2019 10:57 AM   Modules accepted: Orders

## 2019-10-23 ENCOUNTER — Encounter (INDEPENDENT_AMBULATORY_CARE_PROVIDER_SITE_OTHER): Payer: Self-pay

## 2019-10-24 ENCOUNTER — Ambulatory Visit (INDEPENDENT_AMBULATORY_CARE_PROVIDER_SITE_OTHER): Payer: BC Managed Care – PPO | Admitting: Bariatrics

## 2019-10-28 DIAGNOSIS — E1165 Type 2 diabetes mellitus with hyperglycemia: Secondary | ICD-10-CM | POA: Diagnosis not present

## 2019-10-28 DIAGNOSIS — M159 Polyosteoarthritis, unspecified: Secondary | ICD-10-CM | POA: Diagnosis not present

## 2019-10-28 DIAGNOSIS — E1159 Type 2 diabetes mellitus with other circulatory complications: Secondary | ICD-10-CM | POA: Diagnosis not present

## 2019-10-28 DIAGNOSIS — Z794 Long term (current) use of insulin: Secondary | ICD-10-CM | POA: Diagnosis not present

## 2019-10-28 DIAGNOSIS — E782 Mixed hyperlipidemia: Secondary | ICD-10-CM | POA: Diagnosis not present

## 2019-10-28 DIAGNOSIS — I2510 Atherosclerotic heart disease of native coronary artery without angina pectoris: Secondary | ICD-10-CM | POA: Diagnosis not present

## 2019-10-28 DIAGNOSIS — I10 Essential (primary) hypertension: Secondary | ICD-10-CM | POA: Diagnosis not present

## 2019-10-30 DIAGNOSIS — G4733 Obstructive sleep apnea (adult) (pediatric): Secondary | ICD-10-CM | POA: Diagnosis not present

## 2019-10-31 ENCOUNTER — Ambulatory Visit (INDEPENDENT_AMBULATORY_CARE_PROVIDER_SITE_OTHER): Payer: PPO | Admitting: Bariatrics

## 2019-10-31 ENCOUNTER — Other Ambulatory Visit: Payer: Self-pay

## 2019-10-31 ENCOUNTER — Encounter (INDEPENDENT_AMBULATORY_CARE_PROVIDER_SITE_OTHER): Payer: Self-pay | Admitting: Bariatrics

## 2019-10-31 VITALS — BP 110/67 | HR 78 | Temp 97.8°F | Ht 61.0 in | Wt 167.0 lb

## 2019-10-31 DIAGNOSIS — F3289 Other specified depressive episodes: Secondary | ICD-10-CM

## 2019-10-31 DIAGNOSIS — I10 Essential (primary) hypertension: Secondary | ICD-10-CM

## 2019-10-31 DIAGNOSIS — E669 Obesity, unspecified: Secondary | ICD-10-CM | POA: Diagnosis not present

## 2019-10-31 DIAGNOSIS — Z6831 Body mass index (BMI) 31.0-31.9, adult: Secondary | ICD-10-CM

## 2019-10-31 MED ORDER — TROKENDI XR 50 MG PO CP24: 1.0000 | ORAL_CAPSULE | Freq: Every day | ORAL | 0 refills | Status: DC

## 2019-10-31 NOTE — Progress Notes (Signed)
Chief Complaint:   [place] is here to discuss her progress with her obesity treatment plan along with follow-up of her obesity related diagnoses. Nioka is on the Category 1 Plan and states she is following her eating plan approximately 80% of the time. Dameshia states she is walking 8,000 steps 7 times per week and working with a Physiological scientist 1 time per week.  Today's visit was #: 30 Starting weight: 180 lbs Starting date: 05/28/2018 Today's weight: 167 lbs Today's date: 10/31/2019 Total lbs lost to date: 13 Total lbs lost since last in-office visit: 4  Interim History: Cameisha is down 4 lbs. She was more adherent to the plan. She is doing well with her water and other fluids.  Subjective:   Essential hypertension. Blood pressure is well controlled at 110/67.  BP Readings from Last 3 Encounters:  10/31/19 110/67  10/14/19 118/78  10/10/19 117/70   Lab Results  Component Value Date   CREATININE 0.97 07/09/2019   CREATININE 1.09 (H) 11/14/2018   CREATININE 0.87 05/28/2018   Other depression,with emotional eating. Mechel is struggling with emotional eating and using food for comfort to the extent that it is negatively impacting her health. She has been working on behavior modification techniques to help reduce her emotional eating and has been somewhat successful. She shows no sign of suicidal or homicidal ideations.  Assessment/Plan:   Essential hypertension. Annavictoria is working on healthy weight loss and exercise to improve blood pressure control. We will watch for signs of hypotension as she continues her lifestyle modifications. She will continue her medications as directed.  Other depression,with emotional eating. Behavior modification techniques were discussed today to help Deondria deal with her emotional/non-hunger eating behaviors.  Orders and follow up as documented in patient record. Tamora was given a prescription for Topiramate ER (TROKENDI XR) 50 MG  CP24 1 capsule daily #30 with 0 refills.  Class 1 obesity with serious comorbidity and body mass index (BMI) of 31.0 to 31.9 in adult, unspecified obesity type.  Karielys is currently in the action stage of change. As such, her goal is to continue with weight loss efforts. She has agreed to the Category 1 Plan.   Exercise goals: Deshonda will walk on the beach.   Behavioral modification strategies: increasing lean protein intake, decreasing simple carbohydrates, increasing vegetables, increasing water intake, decreasing eating out, no skipping meals, meal planning and cooking strategies and keeping healthy foods in the home.  Beckey has agreed to follow-up with our clinic in 2 weeks. She was informed of the importance of frequent follow-up visits to maximize her success with intensive lifestyle modifications for her multiple health conditions.   Objective:   Blood pressure 110/67, pulse 78, temperature 97.8 F (36.6 C), temperature source Oral, height 5\' 1"  (1.549 m), weight 167 lb (75.8 kg), SpO2 96 %. Body mass index is 31.55 kg/m.  General: Cooperative, alert, well developed, in no acute distress. HEENT: Conjunctivae and lids unremarkable. Cardiovascular: Regular rhythm.  Lungs: Normal work of breathing. Neurologic: No focal deficits.   Lab Results  Component Value Date   CREATININE 0.97 07/09/2019   BUN 15 07/09/2019   NA 144 07/09/2019   K 4.7 07/09/2019   CL 107 (H) 07/09/2019   CO2 24 07/09/2019   Lab Results  Component Value Date   ALT 20 07/09/2019   AST 21 07/09/2019   ALKPHOS 73 07/09/2019   BILITOT 0.3 07/09/2019   Lab Results  Component Value Date  HGBA1C 7.1 (H) 07/09/2019   HGBA1C 6.9 (H) 11/14/2018   No results found for: INSULIN Lab Results  Component Value Date   TSH 2.100 07/09/2019   Lab Results  Component Value Date   CHOL 168 07/09/2019   HDL 46 07/09/2019   LDLCALC 90 07/09/2019   TRIG 186 (H) 07/09/2019   CHOLHDL 2.9 12/20/2018   No  results found for: WBC, HGB, HCT, MCV, PLT No results found for: IRON, TIBC, FERRITIN  Obesity Behavioral Intervention Documentation for Insurance:   Approximately 15 minutes were spent on the discussion below.  ASK: We discussed the diagnosis of obesity with [name] today and Jeweline agreed to give [name] permission to discuss obesity behavioral modification therapy today.  ASSESS: Shuwanda has the diagnosis of obesity and her BMI today is 31.6. Tanah is in the action stage of change.   ADVISE: Delsy was educated on the multiple health risks of obesity as well as the benefit of weight loss to improve her health. She was advised of the need for long term treatment and the importance of lifestyle modifications to improve her current health and to decrease her risk of future health problems.  AGREE: Multiple dietary modification options and treatment options were discussed and [name] agreed to follow the recommendations documented in the above note.  ARRANGE: Meca was educated on the importance of frequent visits to treat obesity as outlined per CMS and USPSTF guidelines and agreed to schedule her next follow up appointment today.  Attestation Statements:   Reviewed by clinician on day of visit: allergies, medications, problem list, medical history, surgical history, family history, social history, and previous encounter notes.  Migdalia Dk, am acting as Location manager for [employer], DO   I have reviewed the above documentation for accuracy and completeness, and I agree with the above. [name], DO

## 2019-11-04 ENCOUNTER — Encounter (INDEPENDENT_AMBULATORY_CARE_PROVIDER_SITE_OTHER): Payer: Self-pay | Admitting: Bariatrics

## 2019-11-05 ENCOUNTER — Ambulatory Visit: Payer: HMO | Attending: Internal Medicine

## 2019-11-05 ENCOUNTER — Ambulatory Visit: Payer: PPO

## 2019-11-05 DIAGNOSIS — Z23 Encounter for immunization: Secondary | ICD-10-CM

## 2019-11-05 NOTE — Progress Notes (Signed)
   Covid-19 Vaccination Clinic  Name:  DEANNDRA COOPRIDER    MRN: [id number] DOB: [birth year]  11/05/2019  Ms. Sibbett was observed post Covid-19 immunization for 15 minutes without incident. She was provided with Vaccine Information Sheet and instruction to access the V-Safe system.   Ms. Frasch was instructed to call 911 with any severe reactions post vaccine: Marland Kitchen Difficulty breathing  . Swelling of face and throat  . A fast heartbeat  . A bad rash all over body  . Dizziness and weakness   Immunizations Administered    Name Date Dose VIS Date Route   Pfizer COVID-19 Vaccine 11/05/2019  1:12 PM 0.3 mL 08/16/2019 Intramuscular   Manufacturer: [place]   Lot: [id number]   [place]: [id number]

## 2019-11-06 ENCOUNTER — Ambulatory Visit: Payer: Self-pay

## 2019-11-07 ENCOUNTER — Ambulatory Visit (INDEPENDENT_AMBULATORY_CARE_PROVIDER_SITE_OTHER): Payer: HMO | Admitting: Pharmacist

## 2019-11-07 ENCOUNTER — Other Ambulatory Visit: Payer: Self-pay

## 2019-11-07 VITALS — BP 104/62 | HR 70 | Resp 16 | Ht 63.0 in | Wt 175.4 lb

## 2019-11-07 DIAGNOSIS — E7849 Other hyperlipidemia: Secondary | ICD-10-CM

## 2019-11-07 MED ORDER — ROSUVASTATIN CALCIUM 10 MG PO TABS: ORAL_TABLET | ORAL | 1 refills | Status: DC

## 2019-11-07 NOTE — Progress Notes (Signed)
Patient ID: Sandra [name]                 DOB: 12-Dec-1945                    MRN: [id number]     HPI: Sandra [name] is a 74 y.o. female patient referred to lipid clinic by Dr Martinique. PMH is significant for CAD s/p stent placement in 2017, hypercholesterolemia, tobacco abuse, DM, and hx of DVT. Carotid doppler done in January 2021 showed 60-79% stenosis of left carotid and 40-59% right carotid stenosis. Patient is currently taking rosuvastatin 10mg  once weekly and ezetimibe 10mg  daily. She is here to discussed PCSK9i options to improved lipid management.   Current Medications:  Ezetimibe 10mg  daily Rosuvastatin 10mg  weekly  Intolerances:  Rosuvastatin 10mg  daily Rosuvastatin 5mg  daily Simvastatin 20mg  daily  LDL goal: 70mg /dL  Diet: weight management with [facility] loss and management (Dr Owens Shark)  Exercise: daily walks   Family History: father MI, leukemia in sister, DM in brother  Social History: alcohol occasional , occasional smoker  Labs: 07/09/2019: CHO 168; TG 186; HDL 46; LDLc 90   Past Medical History:  Diagnosis Date  . Arthritis   . Back pain   . Chronic kidney disease   . Congenital absence of inferior vena cava   . Congenital absence of left kidney   . Congenital absence of left ovary   . Coronary artery disease 2004   STATUS POST STENTING OF THE RIGHT CORONARY OSTIUM   . DDD (degenerative disc disease)   . Diabetes mellitus without complication ([place])   . Dislocated shoulder    left  . DVT (deep venous thrombosis) ([place]) 2014   left posterior tibial vein  . High blood pressure   . Hypercholesterolemia   . Joint pain   . Osteoarthritis   . Peripheral vascular disease ([place])   . Sleep apnea   . Sleep apnea   . SOB (shortness of breath)   . Tobacco dependence   . Vitamin D deficiency     Current Outpatient Medications on File Prior to Visit  Medication Sig Dispense Refill  . amitriptyline (ELAVIL) 25 MG tablet Take one to three (1-3) tablets (25 mg  to 75 mg total) by mouth twice daily as needed for back pain.    Marland Kitchen aspirin EC 81 MG tablet Take 1 tablet (81 mg total) by mouth daily. 90 tablet 3  . BIOTIN PO Take by mouth.    Marland Kitchen CALCIUM PO Take by mouth daily. Reported on 02/24/2016    . cetirizine (ZYRTEC) 10 MG tablet Take 10 mg by mouth daily.    . Cinnamon 500 MG capsule Take 500 mg by mouth daily.    Marland Kitchen co-enzyme Q-10 30 MG capsule Take 30 mg by mouth 3 (three) times daily.    . empagliflozin (JARDIANCE) 25 MG TABS tablet Take 25 mg by mouth daily.    Marland Kitchen ezetimibe (ZETIA) 10 MG tablet Take 1 tablet (10 mg total) by mouth daily. NEEDS OV FOR FUTURE REFILL 90 tablet 0  . Fexofenadine HCl (MUCINEX ALLERGY PO) Take by mouth.    . fluticasone (FLONASE) 50 MCG/ACT nasal spray Place 2 sprays into the nose daily.    Marland Kitchen ibuprofen (ADVIL,MOTRIN) 100 MG tablet Take 100 mg by mouth every 6 (six) hours as needed.      Marland Kitchen lisinopril (PRINIVIL,ZESTRIL) 2.5 MG tablet Take 2.5 mg by mouth daily.    . metoprolol (TOPROL-XL) 50 MG  24 hr tablet Take 25 mg by mouth daily.     Marland Kitchen oxybutynin (DITROPAN-XL) 10 MG 24 hr tablet Take 10 mg by mouth at bedtime.    . Topiramate ER (TROKENDI XR) 50 MG CP24 Take 1 capsule by mouth daily. 30 capsule 0  . VITAMIN D, CHOLECALCIFEROL, PO Take 2,000 mg by mouth daily.     Claris Che 100-3.6 UNIT-MG/ML SOPN SMARTSIG:16 Unit(s) SUB-Q Daily     No current facility-administered medications on file prior to visit.    Allergies  Allergen Reactions  . Erythromycin   . Epinephrine Palpitations  . Metformin And Related Rash    Other hyperlipidemia LDL remains above goal for secondary prevention on high risk patient. She is unable to tolerate daily statin dose but reports compliance to rosuvastatin 10mg  once weekly and daily ezetimibe. We discussed PCSK9i therapy (Repatha and Praluent) for lipid management. Patient will like trial with rosuvastatin 3x weekly before initiating injectable therapy. Instructed to increase rosuvastatin  to 10mg  every Monday and Friday for 2 weeks, then 10mg  every Monday, Wednesday and Friday. Plan to repeat fasting lipid in 8 weeks to determine if able to reach LDL goal.  [name] agrees to initiate low dose PCSK9i every 14 days if LDL remains above goal or if unable to tolerate rosuvastatin therapy. Plan to stop ezetimibe and if able to start [place] Rodriguez-Guzman PharmD, BCPS, [place] [address] [place], [address] 11/11/2019 6:24 PM

## 2019-11-07 NOTE — Patient Instructions (Addendum)
[facility] (pharmacist) [phone] Sandra [name]/Sandra [name]/Sandra [name]   Your Results:             Your most recent labs Goal  Total Cholesterol 168 < 200  Triglycerides 186 < 150  HDL (good cholesterol) 46 > 40  LDL (bad cholesterol 90 < 70     Medication changes:  INCREASE rosuvastatin 10mg  every Monday, Wednesday and Friday  *Call back if unable to tolerate for start process for Repatha injection*   Lab orders:  *Fasting lipid work in 8 weeks*   Patient Assistance:  The [employer] foundation offers assistance to help pay for medication copays.  They will cover copays for all cholesterol lowering meds, including statins, fibrates, omega-3 oils, ezetimibe, Repatha, Praluent, Nexletol, Nexlizet.  The cards are usually good for $2,500 or 12 months, whichever comes first. 1. Go to healthwellfoundation.org 2. Click on "Apply Now" 3. Answer questions as to whom is applying (patient or representative) 4. Your disease fund will be "hypercholesterolemia - Medicare access" 5. They will ask questions about finances and which medications you are taking for cholesterol 6. When you submit, the approval is usually within minutes.  You will need to print the card information from the site 7. You will need to show this information to your pharmacy, they will bill your Medicare Part D plan first -then bill [employer] --for the copay.   You can also call them at [phone], although the hold times can be quite long.   Thank you for choosing [facility]      High Cholesterol  High cholesterol is a condition in which the blood has high levels of a white, waxy, fat-like substance (cholesterol). The human body needs small amounts of cholesterol. The liver makes all the cholesterol that the body needs. Extra (excess) cholesterol comes from the food that we eat. Cholesterol is carried from the liver by the blood through the blood vessels. If you have high cholesterol, deposits (plaques) may build up  on the walls of your blood vessels (arteries). Plaques make the arteries narrower and stiffer. Cholesterol plaques increase your risk for heart attack and stroke. Work with your health care provider to keep your cholesterol levels in a healthy range. What increases the risk? This condition is more likely to develop in people who:  Eat foods that are high in animal fat (saturated fat) or cholesterol.  Are overweight.  Are not getting enough exercise.  Have a family history of high cholesterol. What are the signs or symptoms? There are no symptoms of this condition. How is this diagnosed? This condition may be diagnosed from the results of a blood test.  If you are older than age 67, your health care provider may check your cholesterol every 4-6 years.  You may be checked more often if you already have high cholesterol or other risk factors for heart disease. The blood test for cholesterol measures:  "Bad" cholesterol (LDL cholesterol). This is the main type of cholesterol that causes heart disease. The desired level for LDL is less than 100.  "Good" cholesterol (HDL cholesterol). This type helps to protect against heart disease by cleaning the arteries and carrying the LDL away. The desired level for HDL is 60 or higher.  Triglycerides. These are fats that the body can store or burn for energy. The desired number for triglycerides is lower than 150.  Total cholesterol. This is a measure of the total amount of cholesterol in your blood, including LDL cholesterol, HDL cholesterol, and triglycerides. A  healthy number is less than 200. How is this treated? This condition is treated with diet changes, lifestyle changes, and medicines. Diet changes  This may include eating more whole grains, fruits, vegetables, nuts, and fish.  This may also include cutting back on red meat and foods that have a lot of added sugar. Lifestyle changes  Changes may include getting at least 40 minutes of  aerobic exercise 3 times a week. Aerobic exercises include walking, biking, and swimming. Aerobic exercise along with a healthy diet can help you maintain a healthy weight.  Changes may also include quitting smoking. Medicines  Medicines are usually given if diet and lifestyle changes have failed to reduce your cholesterol to healthy levels.  Your health care provider may prescribe a statin medicine. Statin medicines have been shown to reduce cholesterol, which can reduce the risk of heart disease. Follow these instructions at home: Eating and drinking If told by your health care provider:  Eat chicken (without skin), fish, veal, shellfish, ground [place] breast, and round or loin cuts of red meat.  Do not eat fried foods or fatty meats, such as hot dogs and salami.  Eat plenty of fruits, such as apples.  Eat plenty of vegetables, such as broccoli, potatoes, and carrots.  Eat beans, peas, and lentils.  Eat grains such as barley, rice, couscous, and bulgur wheat.  Eat pasta without cream sauces.  Use skim or nonfat milk, and eat low-fat or nonfat yogurt and cheeses.  Do not eat or drink whole milk, cream, ice cream, egg yolks, or hard cheeses.  Do not eat stick margarine or tub margarines that contain trans fats (also called partially hydrogenated oils).  Do not eat saturated tropical oils, such as coconut oil and palm oil.  Do not eat cakes, cookies, crackers, or other baked goods that contain trans fats.  General instructions  Exercise as directed by your health care provider. Increase your activity level with activities such as gardening, walking, and taking the stairs.  Take over-the-counter and prescription medicines only as told by your health care provider.  Do not use any products that contain nicotine or tobacco, such as cigarettes and e-cigarettes. If you need help quitting, ask your health care provider.  Keep all follow-up visits as told by your health care  provider. This is important. Contact a health care provider if:  You are struggling to maintain a healthy diet or weight.  You need help to start on an exercise program.  You need help to stop smoking. Get help right away if:  You have chest pain.  You have trouble breathing. This information is not intended to replace advice given to you by your health care provider. Make sure you discuss any questions you have with your health care provider. Document Revised: 08/25/2017 Document Reviewed: 02/20/2016 Elsevier Patient Education  [place].

## 2019-11-11 ENCOUNTER — Encounter: Payer: Self-pay | Admitting: Pharmacist

## 2019-11-11 NOTE — Assessment & Plan Note (Signed)
LDL remains above goal for secondary prevention on high risk patient. She is unable to tolerate daily statin dose but reports compliance to rosuvastatin 10mg  once weekly and daily ezetimibe. We discussed PCSK9i therapy (Repatha and Praluent) for lipid management. Patient will like trial with rosuvastatin 3x weekly before initiating injectable therapy. Instructed to increase rosuvastatin to 10mg  every Monday and Friday for 2 weeks, then 10mg  every Monday, Wednesday and Friday. Plan to repeat fasting lipid in 8 weeks to determine if able to reach LDL goal.  Sandra [name] agrees to initiate low dose PCSK9i every 14 days if LDL remains above goal or if unable to tolerate rosuvastatin therapy. Plan to stop ezetimibe and if able to start [facility]

## 2019-11-21 ENCOUNTER — Other Ambulatory Visit: Payer: Self-pay

## 2019-11-21 ENCOUNTER — Encounter (INDEPENDENT_AMBULATORY_CARE_PROVIDER_SITE_OTHER): Payer: Self-pay | Admitting: Bariatrics

## 2019-11-21 ENCOUNTER — Ambulatory Visit (INDEPENDENT_AMBULATORY_CARE_PROVIDER_SITE_OTHER): Payer: HMO | Admitting: Bariatrics

## 2019-11-21 VITALS — BP 109/65 | HR 79 | Temp 97.9°F | Ht 61.0 in | Wt 166.0 lb

## 2019-11-21 DIAGNOSIS — Z6831 Body mass index (BMI) 31.0-31.9, adult: Secondary | ICD-10-CM | POA: Diagnosis not present

## 2019-11-21 DIAGNOSIS — E669 Obesity, unspecified: Secondary | ICD-10-CM | POA: Diagnosis not present

## 2019-11-21 DIAGNOSIS — E7849 Other hyperlipidemia: Secondary | ICD-10-CM | POA: Diagnosis not present

## 2019-11-21 DIAGNOSIS — F3289 Other specified depressive episodes: Secondary | ICD-10-CM

## 2019-11-21 DIAGNOSIS — F17200 Nicotine dependence, unspecified, uncomplicated: Secondary | ICD-10-CM | POA: Diagnosis not present

## 2019-11-21 DIAGNOSIS — G4733 Obstructive sleep apnea (adult) (pediatric): Secondary | ICD-10-CM

## 2019-11-21 MED ORDER — CHANTIX STARTING MONTH PAK 0.5 MG X 11 & 1 MG X 42 PO TABS: ORAL_TABLET | ORAL | 0 refills | Status: DC

## 2019-11-21 NOTE — Progress Notes (Signed)
Chief Complaint:   [place] is here to discuss her progress with her obesity treatment plan along with follow-up of her obesity related diagnoses. Sandra [name] is on the Category 1 Plan and states she is following her eating plan approximately 80% of the time. Sandra [name] states she is doing cardio/strengthening/walking 30-45 minutes 6 times per week.  Today's visit was #: 2 Starting weight: 180 lbs Starting date: 05/28/2018 Today's weight: 166 lbs Today's date: 11/21/2019 Total lbs lost to date: 14 Total lbs lost since last in-office visit: 1  Interim History: Sandra [name] is down 1 lb. She reports getting adequate protein and water.  Subjective:   Other hyperlipidemia. Sandra [name] is taking Crestor.   Lab Results  Component Value Date   CHOL 168 07/09/2019   HDL 46 07/09/2019   LDLCALC 90 07/09/2019   TRIG 186 (H) 07/09/2019   CHOLHDL 2.9 12/20/2018   Lab Results  Component Value Date   ALT 20 07/09/2019   AST 21 07/09/2019   ALKPHOS 73 07/09/2019   BILITOT 0.3 07/09/2019   The 10-year ASCVD risk score Mikey Bussing DC Jr., et al., 2013) is: 30.5%   Values used to calculate the score:     Age: 74 years     Sex: Female     Is Non-Hispanic African American: No     Diabetic: Yes     Tobacco smoker: No     Systolic Blood Pressure: 0000000 mmHg     Is BP treated: Yes     HDL Cholesterol: 46 mg/dL     Total Cholesterol: 168 mg/dL  Obstructive sleep apnea syndrome. Sandra [name] wears a CPAP.  Smoker. Sandra [name] has started smoking again.  Other depression, with emotional eating. Sandra [name] is struggling with emotional eating and using food for comfort to the extent that it is negatively impacting her health. She has been working on behavior modification techniques to help reduce her emotional eating and has been somewhat successful. She shows no sign of suicidal or homicidal ideations. Sandra [name] reports decreased cravings. She wants to stop Trokendi.  Assessment/Plan:   Other hyperlipidemia.  Cardiovascular risk and specific lipid/LDL goals reviewed.  We discussed several lifestyle modifications today and Sandra [name] will continue to work on diet, exercise and weight loss efforts. Orders and follow up as documented in patient record. Sandra [name] will continue Crestor. She will increase her MUFA's and PUFA's.  Counseling Intensive lifestyle modifications are the first line treatment for this issue. . Dietary changes: Increase soluble fiber. Decrease simple carbohydrates. . Exercise changes: Moderate to vigorous-intensity aerobic activity 150 minutes per week if tolerated. . Lipid-lowering medications: see documented in medical record.  Obstructive sleep apnea syndrome. Intensive lifestyle modifications are the first line treatment for this issue. We discussed several lifestyle modifications today and she will continue to work on diet, exercise and weight loss efforts. We will continue to monitor. Orders and follow up as documented in patient record. Sandra [name] will wear her CPAP nightly.  Counseling  Sleep apnea is a condition in which breathing pauses or becomes shallow during sleep. This happens over and over during the night. This disrupts your sleep and keeps your body from getting the rest that it needs, which can cause tiredness and lack of energy (fatigue) during the day.  Sleep apnea treatment: If you were given a device to open your airway while you sleep, USE IT!  Sleep hygiene:   Limit or avoid alcohol, caffeinated beverages, and cigarettes, especially close to bedtime.   Do not eat  a large meal or eat spicy foods right before bedtime. This can lead to digestive discomfort that can make it hard for you to sleep.  Keep a sleep diary to help you and your health care provider figure out what could be causing your insomnia.  . Make your bedroom a dark, comfortable place where it is easy to fall asleep. ? Put up shades or blackout curtains to block light from outside. ? Use a white noise  machine to block noise. ? Keep the temperature cool. . Limit screen use before bedtime. This includes: ? Watching TV. ? Using your smartphone, tablet, or computer. . Stick to a routine that includes going to bed and waking up at the same times every day and night. This can help you fall asleep faster. Consider making a quiet activity, such as reading, part of your nighttime routine. . Try to avoid taking naps during the day so that you sleep better at night. . Get out of bed if you are still awake after 15 minutes of trying to sleep. Keep the lights down, but try reading or doing a quiet activity. When you feel sleepy, go back to bed.  Smoker. Sandra [name] was given a Chantix starter pack 0.5 mg by mouth once daily for 3 days, then increase to one 0.5 mg twice daily for 4 days, and then increase to one 1 mg tablet twice daily #53 with 0 refills.  Other depression, with emotional eating. Behavior modification techniques were discussed today to help Sandra [name] deal with her emotional/non-hunger eating behaviors.  Orders and follow up as documented in patient record. She will stop Trokendi at this time.  Class 1 obesity with serious comorbidity and body mass index (BMI) of 31.0 to 31.9 in adult, unspecified obesity type.  Sandra [name] is currently in the action stage of change. As such, her goal is to continue with weight loss efforts. She has agreed to the Category 1 Plan.   She will work on meal planning and mindful eating.  Exercise goals: Sandra [name] will continue cardio/strengthening/walking 30-45 minutes 6 times per week.  Behavioral modification strategies: increasing lean protein intake, decreasing simple carbohydrates, increasing vegetables, increasing water intake, decreasing eating out, no skipping meals, meal planning and cooking strategies and keeping healthy foods in the home.  Sandra [name] has agreed to follow-up with our clinic in 2 weeks. She was informed of the importance of frequent follow-up visits to  maximize her success with intensive lifestyle modifications for her multiple health conditions.   Objective:   Blood pressure 109/65, pulse 79, temperature 97.9 F (36.6 C), height 5\' 1"  (1.549 m), weight 166 lb (75.3 kg), SpO2 95 %. Body mass index is 31.37 kg/m.  General: Cooperative, alert, well developed, in no acute distress. HEENT: Conjunctivae and lids unremarkable. Cardiovascular: Regular rhythm.  Lungs: Normal work of breathing. Neurologic: No focal deficits.   Lab Results  Component Value Date   CREATININE 0.97 07/09/2019   BUN 15 07/09/2019   NA 144 07/09/2019   K 4.7 07/09/2019   CL 107 (H) 07/09/2019   CO2 24 07/09/2019   Lab Results  Component Value Date   ALT 20 07/09/2019   AST 21 07/09/2019   ALKPHOS 73 07/09/2019   BILITOT 0.3 07/09/2019   Lab Results  Component Value Date   HGBA1C 7.1 (H) 07/09/2019   HGBA1C 6.9 (H) 11/14/2018   No results found for: INSULIN Lab Results  Component Value Date   TSH 2.100 07/09/2019   Lab Results  Component Value  Date   CHOL 168 07/09/2019   HDL 46 07/09/2019   LDLCALC 90 07/09/2019   TRIG 186 (H) 07/09/2019   CHOLHDL 2.9 12/20/2018   No results found for: WBC, HGB, HCT, MCV, PLT No results found for: IRON, TIBC, FERRITIN  Obesity Behavioral Intervention Documentation for Insurance:   Approximately 15 minutes were spent on the discussion below.  ASK: We discussed the diagnosis of obesity with Sandra [name] today and [place] agreed to give [name] permission to discuss obesity behavioral modification therapy today.  ASSESS: [place] has the diagnosis of obesity and her BMI today is 31.5. [place] is in the action stage of change.   ADVISE: [place] was educated on the multiple health risks of obesity as well as the benefit of weight loss to improve her health. She was advised of the need for long term treatment and the importance of lifestyle modifications to improve her current health and to decrease her risk of future  health problems.  AGREE: Multiple dietary modification options and treatment options were discussed and [place] agreed to follow the recommendations documented in the above note.  ARRANGE: [place] was educated on the importance of frequent visits to treat obesity as outlined per CMS and USPSTF guidelines and agreed to schedule her next follow up appointment today.  Attestation Statements:   Reviewed by clinician on day of visit: allergies, medications, problem list, medical history, surgical history, family history, social history, and previous encounter notes.  Migdalia Dk, am acting as Location manager for [employer], DO   I have reviewed the above documentation for accuracy and completeness, and I agree with the above. [name], DO

## 2019-12-05 ENCOUNTER — Ambulatory Visit (INDEPENDENT_AMBULATORY_CARE_PROVIDER_SITE_OTHER): Payer: HMO | Admitting: Bariatrics

## 2019-12-05 ENCOUNTER — Other Ambulatory Visit: Payer: Self-pay

## 2019-12-05 ENCOUNTER — Encounter (INDEPENDENT_AMBULATORY_CARE_PROVIDER_SITE_OTHER): Payer: Self-pay | Admitting: Bariatrics

## 2019-12-05 VITALS — BP 122/65 | HR 76 | Temp 97.5°F | Ht 61.0 in | Wt 168.0 lb

## 2019-12-05 DIAGNOSIS — E669 Obesity, unspecified: Secondary | ICD-10-CM | POA: Diagnosis not present

## 2019-12-05 DIAGNOSIS — E7849 Other hyperlipidemia: Secondary | ICD-10-CM

## 2019-12-05 DIAGNOSIS — Z6831 Body mass index (BMI) 31.0-31.9, adult: Secondary | ICD-10-CM

## 2019-12-05 DIAGNOSIS — F17200 Nicotine dependence, unspecified, uncomplicated: Secondary | ICD-10-CM

## 2019-12-05 NOTE — Progress Notes (Signed)
Chief Complaint:   Sandra [name] is here to discuss her progress with her obesity treatment plan along with follow-up of her obesity related diagnoses. Sandra [name] is on the Category 1 Plan and states she is following her eating plan approximately 80% of the time. Sandra [name] states she is doing cardio 60 minutes 3 times a week and walking 6,000-8,000 steps 30 minutes 6 times per week.  Today's visit was #: 41 Starting weight: 180 lbs Starting date: 05/28/2018 Today's weight: 168 lbs Today's date: 12/05/2019 Total lbs lost to date: 12 Total lbs lost since last in-office visit: 0  Interim History: Sandra [name] is up 2 lbs, but has done well overall. She is about 1.6 lbs of water.  Subjective:   Other hyperlipidemia. Sandra [name] is taking  Crestor.   Lab Results  Component Value Date   CHOL 168 07/09/2019   HDL 46 07/09/2019   LDLCALC 90 07/09/2019   TRIG 186 (H) 07/09/2019   CHOLHDL 2.9 12/20/2018   Lab Results  Component Value Date   ALT 20 07/09/2019   AST 21 07/09/2019   ALKPHOS 73 07/09/2019   BILITOT 0.3 07/09/2019   The 10-year ASCVD risk score Mikey Bussing DC Jr., et al., 2013) is: 36.6%   Values used to calculate the score:     Age: 74 years     Sex: Female     Is Non-Hispanic African American: No     Diabetic: Yes     Tobacco smoker: No     Systolic Blood Pressure: 123XX123 mmHg     Is BP treated: Yes     HDL Cholesterol: 46 mg/dL     Total Cholesterol: 168 mg/dL  Smoker. Sandra [name] was started on Chantix at her last visit, but has not started it until this a.m.  Assessment/Plan:   Other hyperlipidemia. Cardiovascular risk and specific lipid/LDL goals reviewed.  We discussed several lifestyle modifications today and Sandra [name] will continue to work on diet, exercise and weight loss efforts. Orders and follow up as documented in patient record. She will continue Crestor as directed.  Counseling Intensive lifestyle modifications are the first line treatment for this issue. . Dietary  changes: Increase soluble fiber. Decrease simple carbohydrates. . Exercise changes: Moderate to vigorous-intensity aerobic activity 150 minutes per week if tolerated. . Lipid-lowering medications: see documented in medical record.  Smoker. Sandra [name] will continue Chantix as directed.  Class 1 obesity with serious comorbidity and body mass index (BMI) of 31.0 to 31.9 in adult, unspecified obesity type.  Sandra [name] is currently in the action stage of change. As such, her goal is to continue with weight loss efforts. She has agreed to the Category 1 Plan.   She will work on meal planning, intentional eating, increasing her water intake, decreasing eating out, and increasing her protein intake.  Will check IC at her next visit.  Exercise goals: Sandra [name] is going to the gym.  Behavioral modification strategies: increasing lean protein intake, decreasing simple carbohydrates, increasing vegetables, increasing water intake, decreasing eating out, no skipping meals, meal planning and cooking strategies, keeping healthy foods in the home, ways to avoid boredom eating, ways to avoid night time snacking, better snacking choices, emotional eating strategies and planning for success.  Sandra [name] has agreed to follow-up with our clinic in 2 weeks. She was informed of the importance of frequent follow-up visits to maximize her success with intensive lifestyle modifications for her multiple health conditions.   Objective:   Blood pressure 122/65, pulse 76, temperature (!)  97.5 F (36.4 C), height 5\' 1"  (1.549 m), weight 168 lb (76.2 kg), SpO2 98 %. Body mass index is 31.74 kg/m.  General: Cooperative, alert, well developed, in no acute distress. HEENT: Conjunctivae and lids unremarkable. Cardiovascular: Regular rhythm.  Lungs: Normal work of breathing. Neurologic: No focal deficits.   Lab Results  Component Value Date   CREATININE 0.97 07/09/2019   BUN 15 07/09/2019   NA 144 07/09/2019   K 4.7 07/09/2019    CL 107 (H) 07/09/2019   CO2 24 07/09/2019   Lab Results  Component Value Date   ALT 20 07/09/2019   AST 21 07/09/2019   ALKPHOS 73 07/09/2019   BILITOT 0.3 07/09/2019   Lab Results  Component Value Date   HGBA1C 7.1 (H) 07/09/2019   HGBA1C 6.9 (H) 11/14/2018   No results found for: INSULIN Lab Results  Component Value Date   TSH 2.100 07/09/2019   Lab Results  Component Value Date   CHOL 168 07/09/2019   HDL 46 07/09/2019   LDLCALC 90 07/09/2019   TRIG 186 (H) 07/09/2019   CHOLHDL 2.9 12/20/2018   No results found for: WBC, HGB, HCT, MCV, PLT No results found for: IRON, TIBC, FERRITIN  Obesity Behavioral Intervention Documentation for Insurance:   Approximately 15 minutes were spent on the discussion below.  ASK: We discussed the diagnosis of obesity with Sandra [name] today and Sandra [name] agreed to give [name] permission to discuss obesity behavioral modification therapy today.  ASSESS: Sandra [name] has the diagnosis of obesity and her BMI today is 31.9. Sandra [name] is in the action stage of change.   ADVISE: Sandra [name] was educated on the multiple health risks of obesity as well as the benefit of weight loss to improve her health. She was advised of the need for long term treatment and the importance of lifestyle modifications to improve her current health and to decrease her risk of future health problems.  AGREE: Multiple dietary modification options and treatment options were discussed and Sandra [name] agreed to follow the recommendations documented in the above note.  ARRANGE: Sandra [name] was educated on the importance of frequent visits to treat obesity as outlined per CMS and USPSTF guidelines and agreed to schedule her next follow up appointment today.  Attestation Statements:   Reviewed by clinician on day of visit: allergies, medications, problem list, medical history, surgical history, family history, social history, and previous encounter notes.  Migdalia Dk, am acting as  Location manager for [employer], DO   I have reviewed the above documentation for accuracy and completeness, and I agree with the above. [name], DO

## 2019-12-06 ENCOUNTER — Telehealth: Payer: Self-pay | Admitting: Pharmacist Clinician (PhC)/ Clinical Pharmacy Specialist

## 2019-12-06 NOTE — Telephone Encounter (Signed)
Patient called to report that she is not tolerating rosuvastatin 10 mg three times per week.  Would like to go back to once weekly and start [place].   Advised patient to hold rosuvastatin for 1-2 weeks until myalgias clear then restart with just 1 day per week.  We will start the paperwork to get Repatha covered.

## 2019-12-09 ENCOUNTER — Telehealth: Payer: Self-pay

## 2019-12-09 ENCOUNTER — Encounter (INDEPENDENT_AMBULATORY_CARE_PROVIDER_SITE_OTHER): Payer: Self-pay | Admitting: Bariatrics

## 2019-12-09 MED ORDER — REPATHA SURECLICK 140 MG/ML ~~LOC~~ SOAJ: 140.0000 mg | SUBCUTANEOUS | 11 refills | Status: DC

## 2019-12-09 NOTE — Telephone Encounter (Signed)
Called and instructed the pt to start the repatha, rx sent, pt instructed to call back if unafforable, pt voiced understanding

## 2019-12-13 ENCOUNTER — Other Ambulatory Visit (INDEPENDENT_AMBULATORY_CARE_PROVIDER_SITE_OTHER): Payer: Self-pay | Admitting: Bariatrics

## 2019-12-13 DIAGNOSIS — F3289 Other specified depressive episodes: Secondary | ICD-10-CM

## 2019-12-19 ENCOUNTER — Telehealth: Payer: Self-pay

## 2019-12-19 NOTE — Telephone Encounter (Signed)
Called the pt to clarify which drug insurance they are infact using because currently whats scanned in is hta that indicates for them to be on repatha which is available w/out pa; however, I received a covermymeds form from pharmacy stating that they use [employer] which preferred is praluent. I lmomed the pt to please call us back to discuss so that we may clarify which one she should be on based on insurance formulary (preferred product). Will await call back.Marland KitchenMarland Kitchen

## 2019-12-23 ENCOUNTER — Other Ambulatory Visit: Payer: Self-pay

## 2019-12-23 MED ORDER — REPATHA SURECLICK 140 MG/ML ~~LOC~~ SOAJ: 140.0000 mg | SUBCUTANEOUS | 11 refills | Status: DC

## 2019-12-23 NOTE — Telephone Encounter (Signed)
Pt called to clarify INSURANCE IS HTA, [facility], healthwell grant iniated and approved, emailed approval to the pt, pt voiced understanding

## 2019-12-24 ENCOUNTER — Other Ambulatory Visit (INDEPENDENT_AMBULATORY_CARE_PROVIDER_SITE_OTHER): Payer: Self-pay | Admitting: Bariatrics

## 2019-12-25 ENCOUNTER — Other Ambulatory Visit: Payer: Self-pay

## 2019-12-25 ENCOUNTER — Ambulatory Visit
Admission: RE | Admit: 2019-12-25 | Discharge: 2019-12-25 | Disposition: A | Discharge status: Home / Self Care | Payer: HMO | Source: Ambulatory Visit | Attending: Family Medicine | Admitting: Family Medicine

## 2019-12-25 DIAGNOSIS — E782 Mixed hyperlipidemia: Secondary | ICD-10-CM | POA: Diagnosis not present

## 2019-12-25 DIAGNOSIS — Z1231 Encounter for screening mammogram for malignant neoplasm of breast: Secondary | ICD-10-CM | POA: Diagnosis not present

## 2019-12-25 DIAGNOSIS — I2510 Atherosclerotic heart disease of native coronary artery without angina pectoris: Secondary | ICD-10-CM | POA: Diagnosis not present

## 2019-12-25 DIAGNOSIS — E1159 Type 2 diabetes mellitus with other circulatory complications: Secondary | ICD-10-CM | POA: Diagnosis not present

## 2019-12-25 DIAGNOSIS — I10 Essential (primary) hypertension: Secondary | ICD-10-CM | POA: Diagnosis not present

## 2019-12-25 DIAGNOSIS — E1165 Type 2 diabetes mellitus with hyperglycemia: Secondary | ICD-10-CM | POA: Diagnosis not present

## 2019-12-25 DIAGNOSIS — M159 Polyosteoarthritis, unspecified: Secondary | ICD-10-CM | POA: Diagnosis not present

## 2019-12-26 ENCOUNTER — Ambulatory Visit (INDEPENDENT_AMBULATORY_CARE_PROVIDER_SITE_OTHER): Payer: HMO | Admitting: Bariatrics

## 2019-12-27 ENCOUNTER — Other Ambulatory Visit: Payer: Self-pay

## 2019-12-27 NOTE — Patient Outreach (Signed)
  [facility]) Care Management Chronic Special Needs Program    12/27/2019  Name: Sandra [name], DOB: 07/17/1946  MRN: [id number]   Ms. Sandra [name] is enrolled in a chronic special needs plan for Diabetes. Telephone call to client to complete health risk assessment and initial outreach. Unable to reach. HIPAA compliant voice message left with call back phone number.   PLAN; RNCM will attempt 2nd telephone call to client within 2 weeks.   Quinn Plowman RN,BSN,CCM [facility] [phone]

## 2020-01-01 ENCOUNTER — Other Ambulatory Visit: Payer: Self-pay

## 2020-01-01 NOTE — Patient Outreach (Signed)
  [facility]) Care Management Chronic Special Needs Program    01/01/2020  Name: XIOLA ZABLOCKI, DOB: 03-20-1946  MRN: [id number]   Ms. Jainaba Digangi is enrolled in a chronic special needs plan.  Second telephone call to client for HRA and initial assessment completion. Unable to reach client. HIPAA compliant voice message left with call back number and return call request.   PLAN; RNCM will attempt 3rd telephone call to client within 1 week .  Quinn Plowman RN,BSN,CCM [facility] Care Management [phone]

## 2020-01-03 ENCOUNTER — Other Ambulatory Visit: Payer: Self-pay

## 2020-01-03 NOTE — Patient Outreach (Signed)
[facility]) Care Management Chronic Special Needs Program    01/03/2020  Name: Sandra [name], DOB: [birth year]  MRN: [id number]   Ms. Shondell Mandato is enrolled in a chronic special needs plan for Diabetes.Telephone call to client to completed Health risk assessment and initial telephone outreach. Unable to reach. HIPAA compliant voice message left with call back phone number and return call request. Individualized care plan developed based on available data.   Goals    . Client understands the importance of follow-up with providers by attending scheduled visits     Primary care provider visit 08/05/19 Endocrinology visit 08/15/19 Continue to maintain and keep follow up visits with your providers    . Client will report no worsening of symptoms related to heart disease within the next 12 months     RN case manager will send client education article: Heart disease in diabetes Take your medication as prescribed  Call your doctor if you have questions Keep your follow up visits with your doctor.    . Client will verbalize knowledge of self management of Hypertension as evidences by BP reading of 140/90 or less; or as defined by provider     RN case manager will send client education article: Diabetes and blood pressure and High blood pressure: What you can do Take your medications as prescribed Follow up with your provider as recommended.     Marland Kitchen HEMOGLOBIN A1C < 7.0     Diabetes self management actions:  Glucose monitoring per provider recommendation  Visit provider every 3-6 months as directed  Hbg A1C level every 3-6 months.  Carbohydrate controlled meal planning  Taking diabetes medication as prescribed by provider  Physical activity     . Maintain timely refills of diabetic medication as prescribed within the year .     Review of medical record indicates client maintains timely refills of diabetic medication Take your medications as prescribed.  Follow up  with your doctor if you have questions Contact your assigned RN case manager [phone]) if you have difficulty obtaining medications.     . Obtain annual  Lipid Profile, LDL-C     Lipid profile was completed 07/09/19 The goal for LDL is less than 70 mg/ dl as you are at high risk for complications try to avoid saturated fats, trans-fats, and eat more fiber. RN case manager will send client education article: Heart healthy diet    . Obtain Annual Eye (retinal)  Exam      Annual eye exam completed 02/15/19 Plan to schedule your eye exam yearly    . Obtain Annual Foot Exam     Diabetic foot exam not noted in chart. Diabetes foot care - Check feet daily at home (look for skin color changes, cuts, sores or cracks in the skin, swelling of feet or ankles, ingrown or fungal toenails, corn or calluses). Report these findings to your doctor - Wash feet with soap and water, dry feet well especially between toes - Moisturize your feet but not between the toes - Always wear shoes that protect your whole feet.      . Obtain annual screen for micro albuminuria (urine) , nephropathy (kidney problems)     Annual microalbuminuria was completed 08/15/19 Please attend yearly physicals and follow up visits with your providers and complete labs as recommended.     Illa Level Hemoglobin A1C at least 2 times per year     Hgb A1c completed 07/09/19 and 11/14/18 Continue to keep your follow  up appointments with your provider and have lab work completed as recommended.     . Visit Primary Care Provider or Endocrinologist at least 2 times per year      Primary care provider visit 08/05/19 Endocrinology visit 08/15/19 Continue to maintain and keep follow up visits with your providers       PLAN; RNCM will attempt telephone follow up with client within 3 months.   Quinn Plowman RN,BSN,CCM [facility] [phone]

## 2020-01-07 ENCOUNTER — Encounter (INDEPENDENT_AMBULATORY_CARE_PROVIDER_SITE_OTHER): Payer: Self-pay | Admitting: Bariatrics

## 2020-01-07 ENCOUNTER — Ambulatory Visit (INDEPENDENT_AMBULATORY_CARE_PROVIDER_SITE_OTHER): Payer: HMO | Admitting: Bariatrics

## 2020-01-07 ENCOUNTER — Other Ambulatory Visit: Payer: Self-pay

## 2020-01-07 VITALS — BP 123/72 | HR 75 | Temp 97.9°F | Ht 62.0 in | Wt 174.0 lb

## 2020-01-07 DIAGNOSIS — Z6831 Body mass index (BMI) 31.0-31.9, adult: Secondary | ICD-10-CM | POA: Diagnosis not present

## 2020-01-07 DIAGNOSIS — E7849 Other hyperlipidemia: Secondary | ICD-10-CM

## 2020-01-07 DIAGNOSIS — R0602 Shortness of breath: Secondary | ICD-10-CM

## 2020-01-07 DIAGNOSIS — E669 Obesity, unspecified: Secondary | ICD-10-CM

## 2020-01-07 DIAGNOSIS — R5383 Other fatigue: Secondary | ICD-10-CM | POA: Diagnosis not present

## 2020-01-07 NOTE — Progress Notes (Signed)
Chief Complaint:   Sandra [name] is here to discuss her progress with her obesity treatment plan along with follow-up of her obesity related diagnoses. Sandra [name] is on the Category 1 Plan and states she is following her eating plan approximately 50% of the time. Sandra [name] states she is walking 4,000 steps 5 times per week.  Today's visit was #: 42 Starting weight: 180 lbs Starting date: 05/28/2018 Today's weight: 174 lbs Today's date: 01/07/2020 Total lbs lost to date: 6 Total lbs lost since last in-office visit: 0  Interim History: Sandra [name] is up 6 lbs, but doing okay overall.  Subjective:   Other hyperlipidemia. Sandra [name] is taking Crestor.   Lab Results  Component Value Date   CHOL 168 07/09/2019   HDL 46 07/09/2019   LDLCALC 90 07/09/2019   TRIG 186 (H) 07/09/2019   CHOLHDL 2.9 12/20/2018   Lab Results  Component Value Date   ALT 20 07/09/2019   AST 21 07/09/2019   ALKPHOS 73 07/09/2019   BILITOT 0.3 07/09/2019   The 10-year ASCVD risk score Sandra [name]., et al., 2013) is: 37.1%   Values used to calculate the score:     Age: 18 years     Sex: Female     Is Non-Hispanic African American: No     Diabetic: Yes     Tobacco smoker: No     Systolic Blood Pressure: [id number] mmHg     Is BP treated: Yes     HDL Cholesterol: 46 mg/dL     Total Cholesterol: 168 mg/dL  Other fatigue. RMR was 1314 on 05/23/2019 and 1496 on 01/07/2020.  Shortness of breath on exertion. Sandra [name] notes increasing shortness of breath with exercising and seems to be worsening over time with weight gain. She notes getting out of breath sooner with activity than she used to. Sandra [name] denies shortness of breath at rest or orthopnea.  Assessment/Plan:   Other hyperlipidemia. Cardiovascular risk and specific lipid/LDL goals reviewed.  We discussed several lifestyle modifications today and Sandra [name] will continue to work on diet, exercise and weight loss efforts. Orders and follow up as documented in patient  record. Sandra [name] will continue Crestor as directed. Comprehensive metabolic panel, CBC with Differential/Platelet, Hemoglobin A1c labs ordered today.  Counseling Intensive lifestyle modifications are the first line treatment for this issue. . Dietary changes: Increase soluble fiber. Decrease simple carbohydrates. . Exercise changes: Moderate to vigorous-intensity aerobic activity 150 minutes per week if tolerated. . Lipid-lowering medications: see documented in medical record.     Other fatigue. Fatigue may be related to obesity, depression or many other causes. Labs will be ordered, and in the meanwhile, Sandra [name] will focus on self care including making healthy food choices, increasing physical activity and focusing on stress reduction. She will work on increasing activity and being more adherent to the plan.  Shortness of breath on exertion. Sandra [name]'s shortness of breath appears to be obesity related and exercise induced. She has agreed to work on weight loss and gradually increase exercise to treat her exercise induced shortness of breath. Will continue to monitor closely.  Class 1 obesity with serious comorbidity and body mass index (BMI) of 31.0 to 31.9 in adult, unspecified obesity type.  Sandra [name] is currently in the action stage of change. As such, her goal is to continue with weight loss efforts. She has agreed to following a lower carbohydrate, vegetable and lean protein rich diet plan.   She will work on meal planning, intentional eating, and  increasing her intake of water and Powerade/Gatorade.  Exercise goals: Older adults should follow the adult guidelines. When older adults cannot meet the adult guidelines, they should be as physically active as their abilities and conditions will allow.   Behavioral modification strategies: increasing lean protein intake, decreasing simple carbohydrates, increasing vegetables, increasing water intake, decreasing eating out, no skipping meals, meal  planning and cooking strategies, keeping healthy foods in the home and planning for success.  Sandra [name] has agreed to follow-up with our clinic in 2-3 weeks. She was informed of the importance of frequent follow-up visits to maximize her success with intensive lifestyle modifications for her multiple health conditions.   Sandra [name] was informed we would discuss her lab results at her next visit unless there is a critical issue that needs to be addressed sooner. Sandra [name] agreed to keep her next visit at the agreed upon time to discuss these results.  Objective:   Blood pressure 123/72, pulse 75, temperature 97.9 F (36.6 C), height 5\' 2"  (1.575 m), weight 174 lb (78.9 kg), SpO2 95 %. Body mass index is 31.83 kg/m.  General: Cooperative, alert, well developed, in no acute distress. HEENT: Conjunctivae and lids unremarkable. Cardiovascular: Regular rhythm.  Lungs: Normal work of breathing. Neurologic: No focal deficits.   Lab Results  Component Value Date   CREATININE 0.97 07/09/2019   BUN 15 07/09/2019   NA 144 07/09/2019   K 4.7 07/09/2019   CL 107 (H) 07/09/2019   CO2 24 07/09/2019   Lab Results  Component Value Date   ALT 20 07/09/2019   AST 21 07/09/2019   ALKPHOS 73 07/09/2019   BILITOT 0.3 07/09/2019   Lab Results  Component Value Date   HGBA1C 7.1 (H) 07/09/2019   HGBA1C 6.9 (H) 11/14/2018   No results found for: INSULIN Lab Results  Component Value Date   TSH 2.100 07/09/2019   Lab Results  Component Value Date   CHOL 168 07/09/2019   HDL 46 07/09/2019   LDLCALC 90 07/09/2019   TRIG 186 (H) 07/09/2019   CHOLHDL 2.9 12/20/2018   No results found for: WBC, HGB, HCT, MCV, PLT No results found for: IRON, TIBC, FERRITIN  Obesity Behavioral Intervention Documentation for Insurance:   Approximately 15 minutes were spent on the discussion below.  ASK: We discussed the diagnosis of obesity with Sandra [name] today and Sandra [name] agreed to give [name] permission to discuss obesity  behavioral modification therapy today.  ASSESS: Sandra [name] has the diagnosis of obesity and her BMI today is 31.9. Sandra [name] is in the action stage of change.   ADVISE: Sandra [name] was educated on the multiple health risks of obesity as well as the benefit of weight loss to improve her health. She was advised of the need for long term treatment and the importance of lifestyle modifications to improve her current health and to decrease her risk of future health problems.  AGREE: Multiple dietary modification options and treatment options were discussed and Heidee agreed to follow the recommendations documented in the above note.  ARRANGE: Merit was educated on the importance of frequent visits to treat obesity as outlined per CMS and USPSTF guidelines and agreed to schedule her next follow up appointment today.  Attestation Statements:   Reviewed by clinician on day of visit: allergies, medications, problem list, medical history, surgical history, family history, social history, and previous encounter notes.  Migdalia Dk, am acting as Location manager for [employer], DO   I have reviewed the above documentation for accuracy and completeness,  and I agree with the above. [name], DO

## 2020-01-08 LAB — CBC WITH DIFFERENTIAL/PLATELET
Basophils Absolute: 0.1 10*3/uL (ref 0.0–0.2)
Basos: 1 %
EOS (ABSOLUTE): 0.3 10*3/uL (ref 0.0–0.4)
Eos: 4 %
Hematocrit: 46.3 % (ref 34.0–46.6)
Hemoglobin: 15.3 g/dL (ref 11.1–15.9)
Immature Grans (Abs): 0 10*3/uL (ref 0.0–0.1)
Immature Granulocytes: 0 %
Lymphocytes Absolute: 1.9 10*3/uL (ref 0.7–3.1)
Lymphs: 26 %
MCH: 30.8 pg (ref 26.6–33.0)
MCHC: 33 g/dL (ref 31.5–35.7)
MCV: 93 fL (ref 79–97)
Monocytes Absolute: 0.4 10*3/uL (ref 0.1–0.9)
Monocytes: 5 %
Neutrophils Absolute: 4.9 10*3/uL (ref 1.4–7.0)
Neutrophils: 64 %
Platelets: 182 10*3/uL (ref 150–450)
RBC: 4.96 x10E6/uL (ref 3.77–5.28)
RDW: 13.2 % (ref 11.7–15.4)
WBC: 7.6 10*3/uL (ref 3.4–10.8)

## 2020-01-08 LAB — COMPREHENSIVE METABOLIC PANEL
ALT: 25 IU/L (ref 0–32)
AST: 29 IU/L (ref 0–40)
Albumin/Globulin Ratio: 1.8 (ref 1.2–2.2)
Albumin: 4.1 g/dL (ref 3.7–4.7)
Alkaline Phosphatase: 81 IU/L (ref 39–117)
BUN/Creatinine Ratio: 20 (ref 12–28)
BUN: 17 mg/dL (ref 8–27)
Bilirubin Total: 0.5 mg/dL (ref 0.0–1.2)
CO2: 26 mmol/L (ref 20–29)
Calcium: 9.3 mg/dL (ref 8.7–10.3)
Chloride: 104 mmol/L (ref 96–106)
Creatinine, Ser: 0.85 mg/dL (ref 0.57–1.00)
GFR calc Af Amer: 77 mL/min/{1.73_m2} (ref >59)
GFR calc non Af Amer: 67 mL/min/{1.73_m2} (ref >59)
Globulin, Total: 2.3 g/dL (ref 1.5–4.5)
Glucose: 123 mg/dL — ABNORMAL HIGH (ref 65–99)
Potassium: 4.7 mmol/L (ref 3.5–5.2)
Sodium: 143 mmol/L (ref 134–144)
Total Protein: 6.4 g/dL (ref 6.0–8.5)

## 2020-01-08 LAB — HEMOGLOBIN A1C
Est. average glucose Bld gHb Est-mCnc: 166 mg/dL
Hgb A1c MFr Bld: 7.4 % — ABNORMAL HIGH (ref 4.8–5.6)

## 2020-01-13 DIAGNOSIS — E559 Vitamin D deficiency, unspecified: Secondary | ICD-10-CM | POA: Diagnosis not present

## 2020-01-13 DIAGNOSIS — Z794 Long term (current) use of insulin: Secondary | ICD-10-CM | POA: Diagnosis not present

## 2020-01-13 DIAGNOSIS — M159 Polyosteoarthritis, unspecified: Secondary | ICD-10-CM | POA: Diagnosis not present

## 2020-01-13 DIAGNOSIS — R32 Unspecified urinary incontinence: Secondary | ICD-10-CM | POA: Diagnosis not present

## 2020-01-13 DIAGNOSIS — I2510 Atherosclerotic heart disease of native coronary artery without angina pectoris: Secondary | ICD-10-CM | POA: Diagnosis not present

## 2020-01-13 DIAGNOSIS — G4733 Obstructive sleep apnea (adult) (pediatric): Secondary | ICD-10-CM | POA: Diagnosis not present

## 2020-01-13 DIAGNOSIS — Z9989 Dependence on other enabling machines and devices: Secondary | ICD-10-CM | POA: Diagnosis not present

## 2020-01-13 DIAGNOSIS — F17200 Nicotine dependence, unspecified, uncomplicated: Secondary | ICD-10-CM | POA: Diagnosis not present

## 2020-01-13 DIAGNOSIS — E782 Mixed hyperlipidemia: Secondary | ICD-10-CM | POA: Diagnosis not present

## 2020-01-13 DIAGNOSIS — I10 Essential (primary) hypertension: Secondary | ICD-10-CM | POA: Diagnosis not present

## 2020-01-13 DIAGNOSIS — E669 Obesity, unspecified: Secondary | ICD-10-CM | POA: Diagnosis not present

## 2020-01-13 DIAGNOSIS — E1159 Type 2 diabetes mellitus with other circulatory complications: Secondary | ICD-10-CM | POA: Diagnosis not present

## 2020-01-21 ENCOUNTER — Ambulatory Visit (INDEPENDENT_AMBULATORY_CARE_PROVIDER_SITE_OTHER): Payer: HMO | Admitting: Bariatrics

## 2020-01-21 ENCOUNTER — Encounter (INDEPENDENT_AMBULATORY_CARE_PROVIDER_SITE_OTHER): Payer: Self-pay | Admitting: Bariatrics

## 2020-01-21 ENCOUNTER — Other Ambulatory Visit: Payer: Self-pay

## 2020-01-21 VITALS — BP 146/77 | HR 76 | Temp 98.0°F | Ht 62.0 in | Wt 172.0 lb

## 2020-01-21 DIAGNOSIS — E119 Type 2 diabetes mellitus without complications: Secondary | ICD-10-CM | POA: Diagnosis not present

## 2020-01-21 DIAGNOSIS — E7849 Other hyperlipidemia: Secondary | ICD-10-CM

## 2020-01-21 DIAGNOSIS — Z6831 Body mass index (BMI) 31.0-31.9, adult: Secondary | ICD-10-CM

## 2020-01-21 DIAGNOSIS — E669 Obesity, unspecified: Secondary | ICD-10-CM | POA: Diagnosis not present

## 2020-01-21 NOTE — Progress Notes (Signed)
Chief Complaint:   Sandra [name] is here to discuss her progress with her obesity treatment plan along with follow-up of her obesity related diagnoses. Sandra [name] is following a lower carbohydrate, vegetable and lean protein rich diet plan and states she is following her eating plan approximately 70% of the time. Sandra [name] states she is walking 4,000 steps 6 times per week.  Today's visit was #: 49 Starting weight: 180 lbs Starting date: 05/28/2018 Today's weight: 172 lbs Today's date: 01/21/2020 Total lbs lost to date: 8 Total lbs lost since last in-office visit: 2  Interim History: Sandra [name] is down 2 lbs and doing well overall. She tried the low carbohydrate plan for 2 days. She reports doing well with her water intake.  Subjective:   Type 2 diabetes mellitus without complication, without long-term current use of insulin ([place]). No hypoglycemia. Fasting blood sugars 130-160's.  Lab Results  Component Value Date   HGBA1C 7.4 (H) 01/07/2020   HGBA1C 7.1 (H) 07/09/2019   HGBA1C 6.9 (H) 11/14/2018   Lab Results  Component Value Date   LDLCALC 90 07/09/2019   CREATININE 0.85 01/07/2020   No results found for: INSULIN  Other hyperlipidemia. [name] is taking Zetia. She is unable to tolerate a statin and was started on Repatha.   Lab Results  Component Value Date   CHOL 168 07/09/2019   HDL 46 07/09/2019   LDLCALC 90 07/09/2019   TRIG 186 (H) 07/09/2019   CHOLHDL 2.9 12/20/2018   Lab Results  Component Value Date   ALT 25 01/07/2020   AST 29 01/07/2020   ALKPHOS 81 01/07/2020   BILITOT 0.5 01/07/2020   The 10-year ASCVD risk score Mikey Bussing DC Jr., et al., 2013) is: 48.1%   Values used to calculate the score:     Age: 74 years     Sex: Female     Is Non-Hispanic African American: No     Diabetic: Yes     Tobacco smoker: No     Systolic Blood Pressure: 123456 mmHg     Is BP treated: Yes     HDL Cholesterol: 46 mg/dL     Total Cholesterol: 168 mg/dL  Assessment/Plan:    Type 2 diabetes mellitus without complication, without long-term current use of insulin ([place]). Good blood sugar control is important to decrease the likelihood of diabetic complications such as nephropathy, neuropathy, limb loss, blindness, coronary artery disease, and death. Intensive lifestyle modification including diet, exercise and weight loss are the first line of treatment for diabetes. Sandra [name] will continue her current medication as directed. She will decrease her carbohydrate intake.  Other hyperlipidemia. Cardiovascular risk and specific lipid/LDL goals reviewed.  We discussed several lifestyle modifications today and Sandra [name] will continue to work on diet, exercise and weight loss efforts. Orders and follow up as documented in patient record. She will continue her medication as directed.  Counseling Intensive lifestyle modifications are the first line treatment for this issue. . Dietary changes: Increase soluble fiber. Decrease simple carbohydrates. . Exercise changes: Moderate to vigorous-intensity aerobic activity 150 minutes per week if tolerated.  . Lipid-lowering medications: see documented in medical record.  Class 1 obesity with serious comorbidity and body mass index (BMI) of 31.0 to 31.9 in adult, unspecified obesity type.  Sandra [name] is currently in the action stage of change. As such, her goal is to continue with weight loss efforts. She has agreed to the Category 1 Plan.   She will work on meal planning and intentional  eating. She was given handout on High Sugar Fruits.  Exercise goals: Anaid will continue her current exercise regimen.  Behavioral modification strategies: increasing lean protein intake, decreasing simple carbohydrates, increasing vegetables, increasing water intake, decreasing eating out, no skipping meals, meal planning and cooking strategies, keeping healthy foods in the home and planning for success.  Sandra [name] has agreed to follow-up with our clinic in 2-3  weeks. She was informed of the importance of frequent follow-up visits to maximize her success with intensive lifestyle modifications for her multiple health conditions.   Objective:   Blood pressure (!) 146/77, pulse 76, temperature 98 F (36.7 C), height 5\' 2"  (1.575 m), weight 172 lb (78 kg), SpO2 95 %. Body mass index is 31.46 kg/m.  General: Cooperative, alert, well developed, in no acute distress. HEENT: Conjunctivae and lids unremarkable. Cardiovascular: Regular rhythm.  Lungs: Normal work of breathing. Neurologic: No focal deficits.   Lab Results  Component Value Date   CREATININE 0.85 01/07/2020   BUN 17 01/07/2020   NA 143 01/07/2020   K 4.7 01/07/2020   CL 104 01/07/2020   CO2 26 01/07/2020   Lab Results  Component Value Date   ALT 25 01/07/2020   AST 29 01/07/2020   ALKPHOS 81 01/07/2020   BILITOT 0.5 01/07/2020   Lab Results  Component Value Date   HGBA1C 7.4 (H) 01/07/2020   HGBA1C 7.1 (H) 07/09/2019   HGBA1C 6.9 (H) 11/14/2018   No results found for: INSULIN Lab Results  Component Value Date   TSH 2.100 07/09/2019   Lab Results  Component Value Date   CHOL 168 07/09/2019   HDL 46 07/09/2019   LDLCALC 90 07/09/2019   TRIG 186 (H) 07/09/2019   CHOLHDL 2.9 12/20/2018   Lab Results  Component Value Date   WBC 7.6 01/07/2020   HGB 15.3 01/07/2020   HCT 46.3 01/07/2020   MCV 93 01/07/2020   PLT 182 01/07/2020   No results found for: IRON, TIBC, FERRITIN  Obesity Behavioral Intervention Documentation for Insurance:   Approximately 15 minutes were spent on the discussion below.  ASK: We discussed the diagnosis of obesity with Sandra [name] today and Sandra [name] agreed to give [name] permission to discuss obesity behavioral modification therapy today.  ASSESS: Sandra [name] has the diagnosis of obesity and her BMI today is 31.6. Sandra [name] is in the action stage of change.   ADVISE: Sandra [name] was educated on the multiple health risks of obesity as well as the benefit of  weight loss to improve her health. She was advised of the need for long term treatment and the importance of lifestyle modifications to improve her current health and to decrease her risk of future health problems.  AGREE: Multiple dietary modification options and treatment options were discussed and Kemarie agreed to follow the recommendations documented in the above note.  ARRANGE: Tashay was educated on the importance of frequent visits to treat obesity as outlined per CMS and USPSTF guidelines and agreed to schedule her next follow up appointment today.  Attestation Statements:   Reviewed by clinician on day of visit: allergies, medications, problem list, medical history, surgical history, family history, social history, and previous encounter notes.  Migdalia Dk, am acting as Location manager for [employer], DO   I have reviewed the above documentation for accuracy and completeness, and I agree with the above. [name], DO

## 2020-01-22 ENCOUNTER — Encounter (INDEPENDENT_AMBULATORY_CARE_PROVIDER_SITE_OTHER): Payer: Self-pay | Admitting: Bariatrics

## 2020-01-27 DIAGNOSIS — E1159 Type 2 diabetes mellitus with other circulatory complications: Secondary | ICD-10-CM | POA: Diagnosis not present

## 2020-01-27 DIAGNOSIS — M159 Polyosteoarthritis, unspecified: Secondary | ICD-10-CM | POA: Diagnosis not present

## 2020-01-27 DIAGNOSIS — I10 Essential (primary) hypertension: Secondary | ICD-10-CM | POA: Diagnosis not present

## 2020-01-27 DIAGNOSIS — I2510 Atherosclerotic heart disease of native coronary artery without angina pectoris: Secondary | ICD-10-CM | POA: Diagnosis not present

## 2020-01-27 DIAGNOSIS — E782 Mixed hyperlipidemia: Secondary | ICD-10-CM | POA: Diagnosis not present

## 2020-02-11 ENCOUNTER — Other Ambulatory Visit: Payer: Self-pay

## 2020-02-11 ENCOUNTER — Ambulatory Visit (INDEPENDENT_AMBULATORY_CARE_PROVIDER_SITE_OTHER): Payer: HMO | Admitting: Family Medicine

## 2020-02-11 ENCOUNTER — Encounter (INDEPENDENT_AMBULATORY_CARE_PROVIDER_SITE_OTHER): Payer: Self-pay | Admitting: Family Medicine

## 2020-02-11 VITALS — BP 130/74 | HR 71 | Temp 97.8°F | Ht 62.0 in | Wt 174.0 lb

## 2020-02-11 DIAGNOSIS — E669 Obesity, unspecified: Secondary | ICD-10-CM

## 2020-02-11 DIAGNOSIS — Z6831 Body mass index (BMI) 31.0-31.9, adult: Secondary | ICD-10-CM | POA: Diagnosis not present

## 2020-02-11 DIAGNOSIS — F3289 Other specified depressive episodes: Secondary | ICD-10-CM | POA: Diagnosis not present

## 2020-02-11 DIAGNOSIS — E7849 Other hyperlipidemia: Secondary | ICD-10-CM | POA: Diagnosis not present

## 2020-02-11 MED ORDER — BUPROPION HCL ER (SR) 150 MG PO TB12: 150.0000 mg | ORAL_TABLET | Freq: Every day | ORAL | 0 refills | Status: DC

## 2020-02-12 NOTE — Progress Notes (Signed)
Chief Complaint:   OBESITY Sandra [name] is here to discuss her progress with her obesity treatment plan along with follow-up of her obesity related diagnoses. Sandra [name] is on the Category 1 Plan and states she is following her eating plan approximately 70% of the time. Sandra [name] states she is doing 0 minutes 0 times per week.  Today's visit was #: 53 Starting weight: 180 lbs Starting date: 05/28/2018 Today's weight: 174 lbs Today's date: 02/11/2020 Total lbs lost to date: 6 Total lbs lost since last in-office visit: 0  Interim History: Sandra [name] notes increased cravings and has struggled to stay on track. She will be traveling most of the next 3-4 weeks, and she would like to discuss strategies yo minimize weight gain.  Subjective:   1. Other hyperlipidemia Sandra [name] could not tolerate statins due to myalgias, and repatha also caused myalgias.  2. Other depression with emotional eating Sandra [name] has been on Trokendi in the past but she didn't feel it was helping so she stopped. She thinks she still has cravings and emotional eating however.  Assessment/Plan:   1. Other hyperlipidemia Cardiovascular risk and specific lipid/LDL goals reviewed. We discussed several lifestyle modifications today and Sandra [name] will continue to work on diet, exercise and weight loss efforts. She will discuss intolerance with her primary care physician. Orders and follow up as documented in patient record.   2. Other depression with emotional eating Behavior modification techniques were discussed today to help Sandra [name] deal with her emotional/non-hunger eating behaviors. Sandra [name] agreed to start Wellbutrin SR 150 mg q AM with no refills. Orders and follow up as documented in patient record.   - buPROPion (WELLBUTRIN SR) 150 MG 12 hr tablet; Take 1 tablet (150 mg total) by mouth daily.  Dispense: 30 tablet; Refill: 0  3. Class 1 obesity with serious comorbidity and body mass index (BMI) of 31.0 to 31.9 in adult, unspecified  obesity type Sandra [name] is currently in the action stage of change. As such, her goal is to continue with weight loss efforts. She has agreed to the Category 1 Plan.   Behavioral modification strategies: travel eating strategies and celebration eating strategies.  Sandra [name] has agreed to follow-up with our clinic in 4 to 5 weeks. She was informed of the importance of frequent follow-up visits to maximize her success with intensive lifestyle modifications for her multiple health conditions.   Objective:   Blood pressure 130/74, pulse 71, temperature 97.8 F (36.6 C), temperature source Oral, height 5\' 2"  (1.575 m), weight 174 lb (78.9 kg), SpO2 95 %. Body mass index is 31.83 kg/m.  General: Cooperative, alert, well developed, in no acute distress. HEENT: Conjunctivae and lids unremarkable. Cardiovascular: Regular rhythm.  Lungs: Normal work of breathing. Neurologic: No focal deficits.   Lab Results  Component Value Date   CREATININE 0.85 01/07/2020   BUN 17 01/07/2020   NA 143 01/07/2020   K 4.7 01/07/2020   CL 104 01/07/2020   CO2 26 01/07/2020   Lab Results  Component Value Date   ALT 25 01/07/2020   AST 29 01/07/2020   ALKPHOS 81 01/07/2020   BILITOT 0.5 01/07/2020   Lab Results  Component Value Date   HGBA1C 7.4 (H) 01/07/2020   HGBA1C 7.1 (H) 07/09/2019   HGBA1C 6.9 (H) 11/14/2018   No results found for: INSULIN Lab Results  Component Value Date   TSH 2.100 07/09/2019   Lab Results  Component Value Date   CHOL 168 07/09/2019   HDL 46 07/09/2019  LDLCALC 90 07/09/2019   TRIG 186 (H) 07/09/2019   CHOLHDL 2.9 12/20/2018   Lab Results  Component Value Date   WBC 7.6 01/07/2020   HGB 15.3 01/07/2020   HCT 46.3 01/07/2020   MCV 93 01/07/2020   PLT 182 01/07/2020   No results found for: IRON, TIBC, FERRITIN  Obesity Behavioral Intervention Documentation for Insurance:   Approximately 15 minutes were spent on the discussion below.  ASK: We discussed the  diagnosis of obesity with Sandra [name] today and Sandra [name] agreed to give [name] permission to discuss obesity behavioral modification therapy today.  ASSESS: Sandra [name] has the diagnosis of obesity and her BMI today is 31.82. Sandra [name] is in the action stage of change.   ADVISE: Sandra [name] was educated on the multiple health risks of obesity as well as the benefit of weight loss to improve her health. She was advised of the need for long term treatment and the importance of lifestyle modifications to improve her current health and to decrease her risk of future health problems.  AGREE: Multiple dietary modification options and treatment options were discussed and Sandra [name] agreed to follow the recommendations documented in the above note.  ARRANGE: Sandra [name] was educated on the importance of frequent visits to treat obesity as outlined per CMS and USPSTF guidelines and agreed to schedule her next follow up appointment today.  Attestation Statements:   Reviewed by clinician on day of visit: allergies, medications, problem list, medical history, surgical history, family history, social history, and previous encounter notes.   I, Trixie Dredge, am acting as transcriptionist for Dennard Nip, MD.  I have reviewed the above documentation for accuracy and completeness, and I agree with the above. -  Dennard Nip, MD

## 2020-03-03 DIAGNOSIS — E1165 Type 2 diabetes mellitus with hyperglycemia: Secondary | ICD-10-CM | POA: Diagnosis not present

## 2020-03-03 DIAGNOSIS — M159 Polyosteoarthritis, unspecified: Secondary | ICD-10-CM | POA: Diagnosis not present

## 2020-03-03 DIAGNOSIS — E782 Mixed hyperlipidemia: Secondary | ICD-10-CM | POA: Diagnosis not present

## 2020-03-03 DIAGNOSIS — E1159 Type 2 diabetes mellitus with other circulatory complications: Secondary | ICD-10-CM | POA: Diagnosis not present

## 2020-03-03 DIAGNOSIS — I2510 Atherosclerotic heart disease of native coronary artery without angina pectoris: Secondary | ICD-10-CM | POA: Diagnosis not present

## 2020-03-03 DIAGNOSIS — I10 Essential (primary) hypertension: Secondary | ICD-10-CM | POA: Diagnosis not present

## 2020-03-12 ENCOUNTER — Encounter (INDEPENDENT_AMBULATORY_CARE_PROVIDER_SITE_OTHER): Payer: Self-pay | Admitting: Bariatrics

## 2020-03-16 ENCOUNTER — Ambulatory Visit (INDEPENDENT_AMBULATORY_CARE_PROVIDER_SITE_OTHER): Payer: HMO | Admitting: Bariatrics

## 2020-03-17 ENCOUNTER — Other Ambulatory Visit: Payer: Self-pay

## 2020-03-17 NOTE — Patient Outreach (Signed)
  [facility]) Care Management Chronic Special Needs Program    03/17/2020  Name: Sandra [name], DOB: [birth year]  MRN: [id number]   Ms. Nicci Vaughan is enrolled in a chronic special needs plan.  Telephone call to client for Health risk assessment completion / initial telephone outreach. Unable to reach. HIPAA compliant voice message left with call back phone number and return call request.   PLAN; RNCM will attempt 2nd telephone call to client within 1 month.   Quinn Plowman RN,BSN,CCM [facility] [phone]

## 2020-03-18 ENCOUNTER — Other Ambulatory Visit (INDEPENDENT_AMBULATORY_CARE_PROVIDER_SITE_OTHER): Payer: Self-pay | Admitting: Family Medicine

## 2020-03-18 ENCOUNTER — Other Ambulatory Visit (INDEPENDENT_AMBULATORY_CARE_PROVIDER_SITE_OTHER): Payer: Self-pay | Admitting: Bariatrics

## 2020-03-18 DIAGNOSIS — F3289 Other specified depressive episodes: Secondary | ICD-10-CM

## 2020-03-19 DIAGNOSIS — M159 Polyosteoarthritis, unspecified: Secondary | ICD-10-CM | POA: Diagnosis not present

## 2020-03-19 DIAGNOSIS — I10 Essential (primary) hypertension: Secondary | ICD-10-CM | POA: Diagnosis not present

## 2020-03-19 DIAGNOSIS — E782 Mixed hyperlipidemia: Secondary | ICD-10-CM | POA: Diagnosis not present

## 2020-03-19 DIAGNOSIS — E1165 Type 2 diabetes mellitus with hyperglycemia: Secondary | ICD-10-CM | POA: Diagnosis not present

## 2020-03-19 DIAGNOSIS — I2510 Atherosclerotic heart disease of native coronary artery without angina pectoris: Secondary | ICD-10-CM | POA: Diagnosis not present

## 2020-03-19 DIAGNOSIS — E1159 Type 2 diabetes mellitus with other circulatory complications: Secondary | ICD-10-CM | POA: Diagnosis not present

## 2020-03-25 ENCOUNTER — Ambulatory Visit: Payer: Self-pay

## 2020-03-27 ENCOUNTER — Ambulatory Visit: Payer: HMO

## 2020-03-27 ENCOUNTER — Ambulatory Visit: Payer: Self-pay

## 2020-03-31 ENCOUNTER — Other Ambulatory Visit: Payer: Self-pay

## 2020-03-31 ENCOUNTER — Ambulatory Visit (INDEPENDENT_AMBULATORY_CARE_PROVIDER_SITE_OTHER): Payer: HMO | Admitting: Bariatrics

## 2020-03-31 ENCOUNTER — Encounter (INDEPENDENT_AMBULATORY_CARE_PROVIDER_SITE_OTHER): Payer: Self-pay | Admitting: Bariatrics

## 2020-03-31 VITALS — BP 116/71 | HR 67 | Temp 98.2°F | Ht 62.0 in | Wt 178.0 lb

## 2020-03-31 DIAGNOSIS — E1169 Type 2 diabetes mellitus with other specified complication: Secondary | ICD-10-CM

## 2020-03-31 DIAGNOSIS — E669 Obesity, unspecified: Secondary | ICD-10-CM | POA: Diagnosis not present

## 2020-03-31 DIAGNOSIS — Z6832 Body mass index (BMI) 32.0-32.9, adult: Secondary | ICD-10-CM | POA: Diagnosis not present

## 2020-03-31 DIAGNOSIS — I10 Essential (primary) hypertension: Secondary | ICD-10-CM

## 2020-03-31 NOTE — Progress Notes (Signed)
Chief Complaint:   [place] is here to discuss her progress with her obesity treatment plan along with follow-up of her obesity related diagnoses. Shatona is on the Category 1 Plan and states she is following her eating plan approximately 30% of the time. Jakerria states she is walking 25,000 steps in 1 week.   Today's visit was #: 47 Starting weight: 180 lbs Starting date: 05/28/2018 Today's weight: 178 lbs Today's date: 03/31/2020 Total lbs lost to date: 2 Total lbs lost since last in-office visit: 0  Interim History: Roderica is up 4 lbs. She states she is sticking to the plan and is doing okay with her water intake.  Subjective:   Diabetes mellitus type 2 in obese ([place]). Vernecia is taking [place] and Xultophy. Fasting blood sugars are in the range of 140-160's.   Lab Results  Component Value Date   HGBA1C 7.4 (H) 01/07/2020   HGBA1C 7.1 (H) 07/09/2019   HGBA1C 6.9 (H) 11/14/2018   Lab Results  Component Value Date   LDLCALC 90 07/09/2019   CREATININE 0.85 01/07/2020   No results found for: INSULIN  Essential hypertension. Sahalie is taking Zestril and Toprol-XL. Blood pressure is controlled.  BP Readings from Last 3 Encounters:  03/31/20 116/71  02/11/20 130/74  01/21/20 (!) 146/77   Lab Results  Component Value Date   CREATININE 0.85 01/07/2020   CREATININE 0.97 07/09/2019   CREATININE 1.09 (H) 11/14/2018   Assessment/Plan:   Diabetes mellitus type 2 in obese ([place]). Good blood sugar control is important to decrease the likelihood of diabetic complications such as nephropathy, neuropathy, limb loss, blindness, coronary artery disease, and death. Intensive lifestyle modification including diet, exercise and weight loss are the first line of treatment for diabetes. Shanta will continue her medications as directed.   Essential hypertension. Fionnuala is working on healthy weight loss and exercise to improve blood pressure control. We will watch for signs  of hypotension as she continues her lifestyle modifications. She will continue her medications as directed.   Class 1 obesity with serious comorbidity and body mass index (BMI) of 32.0 to 32.9 in adult, unspecified obesity type.  Monetta is currently in the action stage of change. As such, her goal is to continue with weight loss efforts. She has agreed to the Category 1 Plan.   She will work on meal planning, intentional eating, weighing her meat, and will try a protein shake at lunch.  Exercise goals: Earnestine will continue her steps and will increase over time.  Behavioral modification strategies: increasing lean protein intake, decreasing simple carbohydrates, increasing vegetables, increasing water intake, decreasing eating out, no skipping meals, meal planning and cooking strategies, keeping healthy foods in the home and planning for success.  Shelley has agreed to follow-up with our clinic fasting in 2-3 weeks. She was informed of the importance of frequent follow-up visits to maximize her success with intensive lifestyle modifications for her multiple health conditions.   Objective:   Blood pressure 116/71, pulse 67, temperature 98.2 F (36.8 C), height 5\' 2"  (1.575 m), weight 178 lb (80.7 kg), SpO2 98 %. Body mass index is 32.56 kg/m.  General: Cooperative, alert, well developed, in no acute distress. HEENT: Conjunctivae and lids unremarkable. Cardiovascular: Regular rhythm.  Lungs: Normal work of breathing. Neurologic: No focal deficits.   Lab Results  Component Value Date   CREATININE 0.85 01/07/2020   BUN 17 01/07/2020   NA 143 01/07/2020   K 4.7 01/07/2020  CL 104 01/07/2020   CO2 26 01/07/2020   Lab Results  Component Value Date   ALT 25 01/07/2020   AST 29 01/07/2020   ALKPHOS 81 01/07/2020   BILITOT 0.5 01/07/2020   Lab Results  Component Value Date   HGBA1C 7.4 (H) 01/07/2020   HGBA1C 7.1 (H) 07/09/2019   HGBA1C 6.9 (H) 11/14/2018   No results found for:  INSULIN Lab Results  Component Value Date   TSH 2.100 07/09/2019   Lab Results  Component Value Date   CHOL 168 07/09/2019   HDL 46 07/09/2019   LDLCALC 90 07/09/2019   TRIG 186 (H) 07/09/2019   CHOLHDL 2.9 12/20/2018   Lab Results  Component Value Date   WBC 7.6 01/07/2020   HGB 15.3 01/07/2020   HCT 46.3 01/07/2020   MCV 93 01/07/2020   PLT 182 01/07/2020   No results found for: IRON, TIBC, FERRITIN  Obesity Behavioral Intervention Documentation for Insurance:   Approximately 15 minutes were spent on the discussion below.  ASK: We discussed the diagnosis of obesity with [name] today and Yuliza agreed to give [name] permission to discuss obesity behavioral modification therapy today.  ASSESS: Vernette has the diagnosis of obesity and her BMI today is 32.7. Brailynn is in the action stage of change.   ADVISE: Ariany was educated on the multiple health risks of obesity as well as the benefit of weight loss to improve her health. She was advised of the need for long term treatment and the importance of lifestyle modifications to improve her current health and to decrease her risk of future health problems.  AGREE: Multiple dietary modification options and treatment options were discussed and Tashauna agreed to follow the recommendations documented in the above note.  ARRANGE: Yarenis was educated on the importance of frequent visits to treat obesity as outlined per CMS and USPSTF guidelines and agreed to schedule her next follow up appointment today.  Attestation Statements:   Reviewed by clinician on day of visit: allergies, medications, problem list, medical history, surgical history, family history, social history, and previous encounter notes.  Migdalia Dk, am acting as Location manager for [employer], DO   I have reviewed the above documentation for accuracy and completeness, and I agree with the above. [name], DO

## 2020-04-01 ENCOUNTER — Other Ambulatory Visit: Payer: Self-pay

## 2020-04-01 NOTE — Patient Outreach (Signed)
[facility]) Care Management Chronic Special Needs Program  04/01/2020  Name: ALASHIA BROWNFIELD DOB: 03/02/1946  MRN: [id number]  Ms. Caira Poche is enrolled in a chronic special needs plan for Diabetes. Chronic Care Management Coordinator telephoned client to review health risk assessment and to develop individualized care plan. HIPAA verified by client.  Introduced the chronic care management program, importance of client participation, and taking their care plan to all provider appointments and inpatient facilities.  Client states she is doing well. She reports regular follow up with her doctors. She states she has her own transportation for appointments. Client state she is able to afford her medications. Client states she checks her blood sugars 1 time per day and denies blood sugars  Below 70.  Client reports she is working on her weight by attending weight management classes, monitoring her diet and exercise.  She reports excessive walking on vacation which has caused some hip pain. She reports this is getting better.    Goals Addressed            This Visit's Progress   . Client understands the importance of follow-up with providers by attending scheduled visits   On track    Primary care provider visit 08/05/19 and 10/28/19 Endocrinology visit 08/15/19, 01/13/20 Continue to maintain and keep follow up visits with your providers    . client will report decrease hip pain within 12 months.       Contact your doctor for ongoing hip pain Utilize over the counter pain reliever as recommended by your doctor.      . Client will report no worsening of symptoms related to heart disease within the next 12 months   On track    . Notify provider for symptoms of chest pain, sweating, nausea/vomiting, irregular heartbeat, palpitations, rapid heart rate, shortness of breath or dizziness or fainting. . Call 911 for severe symptoms of chest pain or shortness of breath. . Take  medications as prescribed.  . Follow up with your cardiologist ( heart doctor) as recommended.  . RN case manager will send client education article:  Coronary artery disease in women    . Client will verbalize knowledge of self management of Hypertension as evidences by BP reading of 140/90 or less; or as defined by provider   On track    RN case manager will send client education article: High blood pressure in adults Continue to take your medications as prescribed Continue to follow up with your doctor as recommended.     . client would like to achieve weight lost goal of 155 lbs       Continue to attend weight management classes as recommended.  Continue to exercise at least 3 days per week  Continue to eat low salt and heart healthy meals: Fruits, vegetables, whole grains, lean protein, and limit fat/ sugars.     Marland Kitchen HEMOGLOBIN A1C < 7       Discussed Diabetes self management actions:  Glucose monitoring per provider recommendation  Visit provider every 3-6 months as directed  Hbg A1C level every 3-6 months.  Carbohydrate controlled meal planning  Taking diabetes medication as prescribed by provider  Physical activity     . Maintain timely refills of diabetic medication as prescribed within the year .   On track    Client reports maintaining timely refills of her medications.  Contact your RN case manager if you have difficulty obtaining your medications.  Continue to take your medications as  prescribed.  Contact your doctor if you have questions.     . Obtain annual  Lipid Profile, LDL-C   On track    Lipid profile was completed 07/09/19 RN case manager will send client education article: Can foods or supplements lower cholesterol?  Exercise at least 3 days a week if tolerated.  Eat a heart healthy/ low fat diet.     . Obtain Annual Eye (retinal)  Exam    On track    Annual eye exam completed 02/15/19, Client reports her next annual eye exam is scheduled for 05/27/20      . Obtain Annual Foot Exam   On track    Your last documented foot exam was 01/13/20 Diabetes foot care - Check feet daily at home (look for skin color changes, cuts, sores or cracks in the skin, swelling of feet or ankles, ingrown or fungal toenails, corn or calluses). Report these findings to your doctor - Wash feet with soap and water, dry feet well especially between toes - Moisturize your feet but not between the toes - Always wear shoes that protect your whole feet.      . Obtain annual screen for micro albuminuria (urine) , nephropathy (kidney problems)   On track    Annual microalbuminuria was completed 08/15/19 Diabetes can affect your kidneys.  It is important for your doctor to check your urine a least once a year. These tests show how your kidneys are working.  RN case manager will send client education article: Urine Albumin    . Obtain Hemoglobin A1C at least 2 times per year   On track    Hgb A1c completed 11/14/18 and 01/07/20 Continue to keep your follow up appointments with your provider and have lab work completed as recommended.     . Visit Primary Care Provider or Endocrinologist at least 2 times per year    On track    Primary care provider visit 08/05/19 and 10/28/19 Endocrinology visit 01/13/20 Continue to maintain and keep follow up visits with your providers       Plan:  Send successful outreach letter with a copy of their individualized care plan, Send individual care plan to provider and Send educational material  Chronic care management coordination will outreach in:  12 months    Quinn Plowman RN,BSN,CCM [facility] [phone]

## 2020-04-16 ENCOUNTER — Ambulatory Visit (INDEPENDENT_AMBULATORY_CARE_PROVIDER_SITE_OTHER): Payer: HMO | Admitting: Bariatrics

## 2020-04-16 ENCOUNTER — Encounter (INDEPENDENT_AMBULATORY_CARE_PROVIDER_SITE_OTHER): Payer: Self-pay | Admitting: Bariatrics

## 2020-04-16 ENCOUNTER — Other Ambulatory Visit: Payer: Self-pay

## 2020-04-16 VITALS — BP 131/82 | HR 77 | Temp 97.9°F | Ht 62.0 in | Wt 173.0 lb

## 2020-04-16 DIAGNOSIS — F17200 Nicotine dependence, unspecified, uncomplicated: Secondary | ICD-10-CM

## 2020-04-16 DIAGNOSIS — E1169 Type 2 diabetes mellitus with other specified complication: Secondary | ICD-10-CM | POA: Diagnosis not present

## 2020-04-16 DIAGNOSIS — Z6831 Body mass index (BMI) 31.0-31.9, adult: Secondary | ICD-10-CM

## 2020-04-16 DIAGNOSIS — E669 Obesity, unspecified: Secondary | ICD-10-CM

## 2020-04-16 DIAGNOSIS — F5089 Other specified eating disorder: Secondary | ICD-10-CM | POA: Diagnosis not present

## 2020-04-16 DIAGNOSIS — E785 Hyperlipidemia, unspecified: Secondary | ICD-10-CM

## 2020-04-16 MED ORDER — BUPROPION HCL ER (SR) 150 MG PO TB12: 150.0000 mg | ORAL_TABLET | Freq: Every day | ORAL | 0 refills | Status: DC

## 2020-04-17 DIAGNOSIS — M150 Primary generalized (osteo)arthritis: Secondary | ICD-10-CM | POA: Diagnosis not present

## 2020-04-17 DIAGNOSIS — M1990 Unspecified osteoarthritis, unspecified site: Secondary | ICD-10-CM | POA: Diagnosis not present

## 2020-04-17 DIAGNOSIS — G894 Chronic pain syndrome: Secondary | ICD-10-CM | POA: Diagnosis not present

## 2020-04-17 DIAGNOSIS — E669 Obesity, unspecified: Secondary | ICD-10-CM | POA: Diagnosis not present

## 2020-04-17 DIAGNOSIS — M791 Myalgia, unspecified site: Secondary | ICD-10-CM | POA: Diagnosis not present

## 2020-04-17 DIAGNOSIS — I2510 Atherosclerotic heart disease of native coronary artery without angina pectoris: Secondary | ICD-10-CM | POA: Diagnosis not present

## 2020-04-17 DIAGNOSIS — Z6831 Body mass index (BMI) 31.0-31.9, adult: Secondary | ICD-10-CM | POA: Diagnosis not present

## 2020-04-17 LAB — COMPREHENSIVE METABOLIC PANEL
ALT: 24 IU/L (ref 0–32)
AST: 25 IU/L (ref 0–40)
Albumin/Globulin Ratio: 1.9 (ref 1.2–2.2)
Albumin: 4.4 g/dL (ref 3.7–4.7)
Alkaline Phosphatase: 66 IU/L (ref 48–121)
BUN/Creatinine Ratio: 24 (ref 12–28)
BUN: 23 mg/dL (ref 8–27)
Bilirubin Total: 0.5 mg/dL (ref 0.0–1.2)
CO2: 23 mmol/L (ref 20–29)
Calcium: 9.7 mg/dL (ref 8.7–10.3)
Chloride: 105 mmol/L (ref 96–106)
Creatinine, Ser: 0.95 mg/dL (ref 0.57–1.00)
GFR calc Af Amer: 67 mL/min/{1.73_m2} (ref >59)
GFR calc non Af Amer: 58 mL/min/{1.73_m2} — ABNORMAL LOW (ref >59)
Globulin, Total: 2.3 g/dL (ref 1.5–4.5)
Glucose: 107 mg/dL — ABNORMAL HIGH (ref 65–99)
Potassium: 4.6 mmol/L (ref 3.5–5.2)
Sodium: 142 mmol/L (ref 134–144)
Total Protein: 6.7 g/dL (ref 6.0–8.5)

## 2020-04-17 LAB — INSULIN, RANDOM: INSULIN: 8.9 u[IU]/mL (ref 2.6–24.9)

## 2020-04-17 LAB — LIPID PANEL WITH LDL/HDL RATIO
Cholesterol, Total: 187 mg/dL (ref 100–199)
HDL: 48 mg/dL (ref >39)
LDL Chol Calc (NIH): 114 mg/dL — ABNORMAL HIGH (ref 0–99)
LDL/HDL Ratio: 2.4 ratio (ref 0.0–3.2)
Triglycerides: 142 mg/dL (ref 0–149)
VLDL Cholesterol Cal: 25 mg/dL (ref 5–40)

## 2020-04-17 LAB — HEMOGLOBIN A1C
Est. average glucose Bld gHb Est-mCnc: 166 mg/dL
Hgb A1c MFr Bld: 7.4 % — ABNORMAL HIGH (ref 4.8–5.6)

## 2020-04-17 LAB — VITAMIN D 25 HYDROXY (VIT D DEFICIENCY, FRACTURES): Vit D, 25-Hydroxy: 37.9 ng/mL (ref 30.0–100.0)

## 2020-04-21 ENCOUNTER — Encounter (INDEPENDENT_AMBULATORY_CARE_PROVIDER_SITE_OTHER): Payer: Self-pay | Admitting: Bariatrics

## 2020-04-21 NOTE — Progress Notes (Signed)
Chief Complaint:   [place] is here to discuss her progress with her obesity treatment plan along with follow-up of her obesity related diagnoses. Keiera is on the Category 1 Plan and states she is following her eating plan approximately 90% of the time. Theta states she is walking 7,000 steps 6 times per week.  Today's visit was #: 53 Starting weight: 180 lbs Starting date: 05/28/2018 Today's weight: 173 lbs Today's date: 04/16/2020 Total lbs lost to date: 7 Total lbs lost since last in-office visit: 5  Interim History: Terran is down 5 lbs and has been more adherent to the plan. She went to the beach by herself. Her hips are "feeling better."  Subjective:   Other disorder of eating. Malaiya is on Wellbutrin and denies side effects.  Diabetes mellitus type 2 in obese ([place]). Enza is taking Jardiance and Xultophy, which was increased to 20 units.  Lab Results  Component Value Date   HGBA1C 7.4 (H) 01/07/2020   HGBA1C 7.1 (H) 07/09/2019   Smoker. Alanni is still taking Chantix 1 daily.  Hyperlipidemia associated with type 2 diabetes mellitus ([place]). Kemya is taking Zetia.   Lab Results  Component Value Date   CHOL 187 04/16/2020   HDL 48 04/16/2020   LDLCALC 114 (H) 04/16/2020   TRIG 142 04/16/2020   CHOLHDL 2.9 12/20/2018   Lab Results  Component Value Date   ALT 24 04/16/2020   AST 25 04/16/2020   ALKPHOS 66 04/16/2020   BILITOT 0.5 04/16/2020   The 10-year ASCVD risk score Mikey Bussing DC Jr., et al., 2013) is: 41.2%   Values used to calculate the score:     Age: 58 years     Sex: Female     Is Non-Hispanic African American: No     Diabetic: Yes     Tobacco smoker: No     Systolic Blood Pressure: 478 mmHg     Is BP treated: Yes     HDL Cholesterol: 48 mg/dL     Total Cholesterol: 187 mg/dL  Assessment/Plan:   Other disorder of eating. Prescription was given for buPROPion (WELLBUTRIN SR) 150 MG 12 hr tablet 1 PO daily #30 with 0  refills.  Diabetes mellitus type 2 in obese ([place]). Good blood sugar control is important to decrease the likelihood of diabetic complications such as nephropathy, neuropathy, limb loss, blindness, coronary artery disease, and death. Intensive lifestyle modification including diet, exercise and weight loss are the first line of treatment for diabetes. Tailey will continue her medications as directed. Comprehensive metabolic panel, Hemoglobin A1c, Insulin, random, VITAMIN D 25 Hydroxy (Vit-D Deficiency, Fractures) labs will be checked today.   Smoker.  Takelia will continue Chantix 1 daily as directed (has home supply).   Hyperlipidemia associated with type 2 diabetes mellitus ([place]). Cardiovascular risk and specific lipid/LDL goals reviewed.  We discussed several lifestyle modifications today and Shakenya will continue to work on diet, exercise and weight loss efforts. Orders and follow up as documented in patient record. Lipid panel will be checked today.  Counseling Intensive lifestyle modifications are the first line treatment for this issue. . Dietary changes: Increase soluble fiber. Decrease simple carbohydrates. . Exercise changes: Moderate to vigorous-intensity aerobic activity 150 minutes per week if tolerated.  . Lipid-lowering medications: see documented in medical record.    Class 1 obesity with serious comorbidity and body mass index (BMI) of 31.0 to 31.9 in adult, unspecified obesity type.  Jennah is currently in the action  stage of change. As such, her goal is to continue with weight loss efforts. She has agreed to the Category 1 Plan.   She will work on meal planning and intentional eating.   Exercise goals: Mariyana will get back to the gym.  Behavioral modification strategies: increasing lean protein intake, decreasing simple carbohydrates, increasing vegetables, increasing water intake, decreasing eating out, meal planning and cooking strategies, keeping healthy foods in the home and  planning for success.  Fraya has agreed to follow-up with our clinic in 2-3 weeks. She was informed of the importance of frequent follow-up visits to maximize her success with intensive lifestyle modifications for her multiple health conditions.   Jaclyne was informed we would discuss her lab results at her next visit unless there is a critical issue that needs to be addressed sooner. Saloni agreed to keep her next visit at the agreed upon time to discuss these results.  Objective:   Blood pressure 131/82, pulse 77, temperature 97.9 F (36.6 C), height 5\' 2"  (1.575 m), weight 173 lb (78.5 kg), SpO2 94 %. Body mass index is 31.64 kg/m.  General: Cooperative, alert, well developed, in no acute distress. HEENT: Conjunctivae and lids unremarkable. Cardiovascular: Regular rhythm.  Lungs: Normal work of breathing. Neurologic: No focal deficits.   Lab Results  Component Value Date   CREATININE 0.95 04/16/2020   BUN 23 04/16/2020   NA 142 04/16/2020   K 4.6 04/16/2020   CL 105 04/16/2020   CO2 23 04/16/2020   Lab Results  Component Value Date   ALT 24 04/16/2020   AST 25 04/16/2020   ALKPHOS 66 04/16/2020   BILITOT 0.5 04/16/2020   Lab Results  Component Value Date   HGBA1C 7.4 (H) 04/16/2020   HGBA1C 7.4 (H) 01/07/2020   HGBA1C 7.1 (H) 07/09/2019   HGBA1C 6.9 (H) 11/14/2018   Lab Results  Component Value Date   INSULIN 8.9 04/16/2020   Lab Results  Component Value Date   TSH 2.100 07/09/2019   Lab Results  Component Value Date   CHOL 187 04/16/2020   HDL 48 04/16/2020   LDLCALC 114 (H) 04/16/2020   TRIG 142 04/16/2020   CHOLHDL 2.9 12/20/2018   Lab Results  Component Value Date   WBC 7.6 01/07/2020   HGB 15.3 01/07/2020   HCT 46.3 01/07/2020   MCV 93 01/07/2020   PLT 182 01/07/2020   No results found for: IRON, TIBC, FERRITIN  Obesity Behavioral Intervention Documentation for Insurance:   Approximately 15 minutes were spent on the discussion  below.  ASK: We discussed the diagnosis of obesity with [name] today and Harjot agreed to give [name] permission to discuss obesity behavioral modification therapy today.  ASSESS: Nikaya has the diagnosis of obesity and her BMI today is 31.8. Zeinab is in the action stage of change.   ADVISE: Chaylee was educated on the multiple health risks of obesity as well as the benefit of weight loss to improve her health. She was advised of the need for long term treatment and the importance of lifestyle modifications to improve her current health and to decrease her risk of future health problems.  AGREE: Multiple dietary modification options and treatment options were discussed and Mackenze agreed to follow the recommendations documented in the above note.  ARRANGE: Aleaha was educated on the importance of frequent visits to treat obesity as outlined per CMS and USPSTF guidelines and agreed to schedule her next follow up appointment today.  Attestation Statements:   Reviewed by  clinician on day of visit: allergies, medications, problem list, medical history, surgical history, family history, social history, and previous encounter notes.  Migdalia Dk, am acting as Location manager for [employer], DO   I have reviewed the above documentation for accuracy and completeness, and I agree with the above. [name], DO

## 2020-05-04 DIAGNOSIS — I10 Essential (primary) hypertension: Secondary | ICD-10-CM | POA: Diagnosis not present

## 2020-05-04 DIAGNOSIS — I2510 Atherosclerotic heart disease of native coronary artery without angina pectoris: Secondary | ICD-10-CM | POA: Diagnosis not present

## 2020-05-04 DIAGNOSIS — E782 Mixed hyperlipidemia: Secondary | ICD-10-CM | POA: Diagnosis not present

## 2020-05-04 DIAGNOSIS — E1159 Type 2 diabetes mellitus with other circulatory complications: Secondary | ICD-10-CM | POA: Diagnosis not present

## 2020-05-04 DIAGNOSIS — M159 Polyosteoarthritis, unspecified: Secondary | ICD-10-CM | POA: Diagnosis not present

## 2020-05-04 DIAGNOSIS — E1165 Type 2 diabetes mellitus with hyperglycemia: Secondary | ICD-10-CM | POA: Diagnosis not present

## 2020-05-14 ENCOUNTER — Ambulatory Visit (INDEPENDENT_AMBULATORY_CARE_PROVIDER_SITE_OTHER): Payer: HMO | Admitting: Bariatrics

## 2020-05-14 ENCOUNTER — Encounter (INDEPENDENT_AMBULATORY_CARE_PROVIDER_SITE_OTHER): Payer: Self-pay | Admitting: Bariatrics

## 2020-05-14 ENCOUNTER — Other Ambulatory Visit: Payer: Self-pay

## 2020-05-14 VITALS — BP 115/73 | HR 73 | Temp 98.0°F | Ht 62.0 in | Wt 177.0 lb

## 2020-05-14 DIAGNOSIS — Z6832 Body mass index (BMI) 32.0-32.9, adult: Secondary | ICD-10-CM | POA: Diagnosis not present

## 2020-05-14 DIAGNOSIS — E669 Obesity, unspecified: Secondary | ICD-10-CM

## 2020-05-14 DIAGNOSIS — E785 Hyperlipidemia, unspecified: Secondary | ICD-10-CM

## 2020-05-14 DIAGNOSIS — E1169 Type 2 diabetes mellitus with other specified complication: Secondary | ICD-10-CM | POA: Diagnosis not present

## 2020-05-18 ENCOUNTER — Encounter (HOSPITAL_COMMUNITY): Payer: HMO

## 2020-05-18 ENCOUNTER — Ambulatory Visit: Payer: HMO

## 2020-05-19 NOTE — Progress Notes (Signed)
Chief Complaint:   OBESITY Sandra [name] is here to discuss her progress with her obesity treatment plan along with follow-up of her obesity related diagnoses. Sandra [name] is on the Category 1 Plan and states she is following her eating plan approximately 70% of the time. Sandra [name] states she is getting 4,000-5,000 steps 5-6 days per week.  Today's visit was #: 55 Starting weight: 180 lbs Starting date: 05/28/2018 Today's weight: 177 lbs Today's date: 05/14/2020 Total lbs lost to date: 3 lbs Total lbs lost since last in-office visit: 0  Interim History: Sandra [name] is up 4 pounds since her last visit.  She has been to the beach.  She says she has been diagnosed with RA recently and has had more pain which she thinks has impacted her eating schedule.    Subjective:   1. Diabetes mellitus type 2 in obese [facility]) She is taking [place] and Xultophy.  Lab Results  Component Value Date   HGBA1C 7.4 (H) 04/16/2020   HGBA1C 7.4 (H) 01/07/2020   HGBA1C 7.1 (H) 07/09/2019   Lab Results  Component Value Date   LDLCALC 114 (H) 04/16/2020   CREATININE 0.95 04/16/2020   Lab Results  Component Value Date   INSULIN 8.9 04/16/2020   2. Hyperlipidemia associated with type 2 diabetes mellitus (Sandra [name]) Sandra [name] has hyperlipidemia and has been trying to improve her cholesterol levels with intensive lifestyle modification including a low saturated fat diet, exercise and weight loss. She denies any chest pain, claudication or myalgias.  She is not on a statin due to side effects.  She is taking Zetia.  Lab Results  Component Value Date   ALT 24 04/16/2020   AST 25 04/16/2020   ALKPHOS 66 04/16/2020   BILITOT 0.5 04/16/2020   Lab Results  Component Value Date   CHOL 187 04/16/2020   HDL 48 04/16/2020   LDLCALC 114 (H) 04/16/2020   TRIG 142 04/16/2020   CHOLHDL 2.9 12/20/2018   Assessment/Plan:   1. Diabetes mellitus type 2 in obese (HCC) Good blood sugar control is important to decrease the likelihood of  diabetic complications such as nephropathy, neuropathy, limb loss, blindness, coronary artery disease, and death. Intensive lifestyle modification including diet, exercise and weight loss are the first line of treatment for diabetes.  Continue medications.   2. Hyperlipidemia associated with type 2 diabetes mellitus ([place]) Cardiovascular risk and specific lipid/LDL goals reviewed.  We discussed several lifestyle modifications today and Sandra [name] will continue to work on diet, exercise and weight loss efforts. Orders and follow up as documented in patient record.  Continue Zetia.  She will talk with her PCP regarding her cholesterol.  Counseling Intensive lifestyle modifications are the first line treatment for this issue. . Dietary changes: Increase soluble fiber. Decrease simple carbohydrates. . Exercise changes: Moderate to vigorous-intensity aerobic activity 150 minutes per week if tolerated. . Lipid-lowering medications: see documented in medical record.  3. Class 1 obesity with serious comorbidity and body mass index (BMI) of 32.0 to 32.9 in adult, unspecified obesity type Sandra [name] is currently in the action stage of change. As such, her goal is to continue with weight loss efforts. She has agreed to the Category 1 Plan.   She will work on meal planning, intentional eating.  Labs from 04/16/2020 were reviewed with her, including CMP, lipid panel, vitamin D, A1c, and insulin.  Decrease carbohydrates and extra snacks.  Exercise goals: Continue walking.  Behavioral modification strategies: increasing lean protein intake, decreasing simple carbohydrates, increasing vegetables, increasing  water intake, decreasing eating out, no skipping meals, meal planning and cooking strategies, keeping healthy foods in the home and planning for success.  Sandra [name] has agreed to follow-up with our clinic in 2-3 weeks. She was informed of the importance of frequent follow-up visits to maximize her success with intensive  lifestyle modifications for her multiple health conditions.   Objective:   Blood pressure 115/73, pulse 73, temperature 98 F (36.7 C), height 5\' 2"  (1.575 m), weight 177 lb (80.3 kg), SpO2 94 %. Body mass index is 32.37 kg/m.  General: Cooperative, alert, well developed, in no acute distress. HEENT: Conjunctivae and lids unremarkable. Cardiovascular: Regular rhythm.  Lungs: Normal work of breathing. Neurologic: No focal deficits.   Lab Results  Component Value Date   CREATININE 0.95 04/16/2020   BUN 23 04/16/2020   NA 142 04/16/2020   K 4.6 04/16/2020   CL 105 04/16/2020   CO2 23 04/16/2020   Lab Results  Component Value Date   ALT 24 04/16/2020   AST 25 04/16/2020   ALKPHOS 66 04/16/2020   BILITOT 0.5 04/16/2020   Lab Results  Component Value Date   HGBA1C 7.4 (H) 04/16/2020   HGBA1C 7.4 (H) 01/07/2020   HGBA1C 7.1 (H) 07/09/2019   HGBA1C 6.9 (H) 11/14/2018   Lab Results  Component Value Date   INSULIN 8.9 04/16/2020   Lab Results  Component Value Date   TSH 2.100 07/09/2019   Lab Results  Component Value Date   CHOL 187 04/16/2020   HDL 48 04/16/2020   LDLCALC 114 (H) 04/16/2020   TRIG 142 04/16/2020   CHOLHDL 2.9 12/20/2018   Lab Results  Component Value Date   WBC 7.6 01/07/2020   HGB 15.3 01/07/2020   HCT 46.3 01/07/2020   MCV 93 01/07/2020   PLT 182 01/07/2020   Obesity Behavioral Intervention:   Approximately 15 minutes were spent on the discussion below.  ASK: We discussed the diagnosis of obesity with Sandra [name] today and Sandra [name] agreed to give [name] permission to discuss obesity behavioral modification therapy today.  ASSESS: Sandra [name] has the diagnosis of obesity and her BMI today is 32.5. Sandra [name] is in the action stage of change.   ADVISE: Sandra [name] was educated on the multiple health risks of obesity as well as the benefit of weight loss to improve her health. She was advised of the need for long term treatment and the importance of lifestyle  modifications to improve her current health and to decrease her risk of future health problems.  AGREE: Multiple dietary modification options and treatment options were discussed and Sandra [name] agreed to follow the recommendations documented in the above note.  ARRANGE: Sandra [name] was educated on the importance of frequent visits to treat obesity as outlined per CMS and USPSTF guidelines and agreed to schedule her next follow up appointment today.  Attestation Statements:   Reviewed by clinician on day of visit: allergies, medications, problem list, medical history, surgical history, family history, social history, and previous encounter notes.  I, Water quality scientist, CMA, am acting as Location manager for [employer], DO  I have reviewed the above documentation for accuracy and completeness, and I agree with the above. [name], DO

## 2020-05-20 ENCOUNTER — Ambulatory Visit: Payer: HMO | Admitting: Physician Assistant

## 2020-05-20 ENCOUNTER — Other Ambulatory Visit: Payer: Self-pay

## 2020-05-20 ENCOUNTER — Ambulatory Visit (HOSPITAL_COMMUNITY)
Admission: RE | Admit: 2020-05-20 | Discharge: 2020-05-20 | Disposition: A | Discharge status: Home / Self Care | Payer: HMO | Source: Ambulatory Visit | Attending: Vascular Surgery | Admitting: Vascular Surgery

## 2020-05-20 VITALS — BP 110/67 | HR 75 | Temp 98.4°F | Resp 20 | Ht 62.0 in | Wt 181.1 lb

## 2020-05-20 DIAGNOSIS — I6523 Occlusion and stenosis of bilateral carotid arteries: Secondary | ICD-10-CM

## 2020-05-20 NOTE — Progress Notes (Signed)
Established Carotid Patient   History of Present Illness   Sandra [name] is a 74 y.o. (04-10-1946) female who presents with chief complaint: carotid artery stenosis.  She has no history of CVA or TIA.  She also denies any strokelike symptoms since last office visit including slurring speech, changes in vision, or one-sided weakness.  She is a former smoker.  She is taking her aspirin daily.  She is following regularly with her PCP for management of chronic medical conditions including hypertension.  [name] PMH, PSH, SH, and FamHx were reviewed and are unchanged from prior visit.  Current Outpatient Medications  Medication Sig Dispense Refill  . amitriptyline (ELAVIL) 25 MG tablet Take 25 mg by mouth at bedtime.    Marland Kitchen aspirin EC 81 MG tablet Take 1 tablet (81 mg total) by mouth daily. 90 tablet 3  . CALCIUM PO Take by mouth daily. Reported on 02/24/2016    . cetirizine (ZYRTEC) 10 MG tablet Take 10 mg by mouth daily.    . Cinnamon 500 MG capsule Take 2,000 mg by mouth daily.     Marland Kitchen co-enzyme Q-10 30 MG capsule Take 30 mg by mouth 3 (three) times daily.    . empagliflozin (JARDIANCE) 25 MG TABS tablet Take 25 mg by mouth daily.    Marland Kitchen ezetimibe (ZETIA) 10 MG tablet Take 1 tablet (10 mg total) by mouth daily. NEEDS OV FOR FUTURE REFILL 90 tablet 0  . fluticasone (FLONASE) 50 MCG/ACT nasal spray Place 2 sprays into the nose daily.    . hydroxychloroquine (PLAQUENIL) 200 MG tablet Take 200 mg by mouth 2 (two) times daily.    Marland Kitchen ibuprofen (ADVIL,MOTRIN) 100 MG tablet Take 100 mg by mouth every 6 (six) hours as needed.      Marland Kitchen lisinopril (PRINIVIL,ZESTRIL) 2.5 MG tablet Take 2.5 mg by mouth daily.    . metoprolol (TOPROL-XL) 50 MG 24 hr tablet Take 25 mg by mouth daily.     . metroNIDAZOLE (METROCREAM) 0.75 % cream Apply topically 2 (two) times daily.    Sandra [name] VERIO test strip SMARTSIG:90 Via Meter Twice Daily    . oxybutynin (DITROPAN-XL) 10 MG 24 hr tablet Take 10 mg by mouth at  bedtime.    . varenicline (CHANTIX) 1 MG tablet Take 1 tablet (1 mg total) by mouth 2 (two) times daily. 180 tablet 0  . VITAMIN D, CHOLECALCIFEROL, PO Take 2,000 mg by mouth daily.      Sandra [name] 100-3.6 UNIT-MG/ML SOPN SMARTSIG:16 Unit(s) SUB-Q Daily     No current facility-administered medications for this visit.    REVIEW OF SYSTEMS (negative unless checked):   Cardiac:  []  Chest pain or chest pressure? []  Shortness of breath upon activity? []  Shortness of breath when lying flat? []  Irregular heart rhythm?  Vascular:  []  Pain in calf, thigh, or hip brought on by walking? []  Pain in feet at night that wakes you up from your sleep? []  Blood clot in your veins? []  Leg swelling?  Pulmonary:  []  Oxygen at home? []  Productive cough? []  Wheezing?  Neurologic:  []  Sudden weakness in arms or legs? []  Sudden numbness in arms or legs? []  Sudden onset of difficult speaking or slurred speech? []  Temporary loss of vision in one eye? []  Problems with dizziness?  Gastrointestinal:  []  Blood in stool? []  Vomited blood?  Genitourinary:  []  Burning when urinating? []  Blood in urine?  Psychiatric:  []  Major depression  Hematologic:  []  Bleeding problems? []   Problems with blood clotting?  Dermatologic:  []  Rashes or ulcers?  Constitutional:  []  Fever or chills?  Ear/Nose/Throat:  []  Change in hearing? []  Nose bleeds? []  Sore throat?  Musculoskeletal:  []  Back pain? []  Joint pain? []  Muscle pain?   Physical Examination   Vitals:   05/20/20 1027 05/20/20 1029  BP: 110/66 110/67  Pulse: 75   Resp: 20   Temp: 98.4 F (36.9 C)   TempSrc: Temporal   SpO2: 96%   Weight: 181 lb 1.6 oz (82.1 kg)   Height: 5\' 2"  (1.575 m)    Body mass index is 33.12 kg/m.  General:  WDWN in NAD; vital signs documented above Gait: Not observed HENT: WNL, normocephalic Pulmonary: normal non-labored breathing , without Rales, rhonchi,  wheezing Cardiac: regular HR Abdomen:  soft, NT, no masses Skin: without rashes Vascular Exam/Pulses:  Right Left  Radial 2+ (normal) 2+ (normal)  DP 2+ (normal) 2+ (normal)   Extremities: without ischemic changes, without Gangrene , without cellulitis; without open wounds;  Musculoskeletal: no muscle wasting or atrophy  Neurologic: A&O X 3;  No focal weakness or paresthesias are detected Psychiatric:  The pt has Normal affect.  Non-Invasive Vascular Imaging   B Carotid Duplex :   R ICA stenosis:  40-59%  R VA:  patent and antegrade  L ICA stenosis:  60-79%  L VA:  patent and antegrade   Medical Decision Making   Sandra [name] is a 74 y.o. female who presents with: asymptomatic carotid artery stenosis   Carotid stenosis remains asymptomatic  Duplex unchanged over the past 6 months with R ICA 40-59% and L ICA 60-79% stenosis  No indication for revascularization of L ICA at this time  Continue aspirin daily  Continue regular follow up with PCP for chronic medical conditions  Recheck carotid duplex in 6 months per protocol   Dagoberto Ligas PA-C [facility] Office: [phone]  Clinic MD: Donzetta Matters

## 2020-05-22 ENCOUNTER — Other Ambulatory Visit: Payer: Self-pay | Admitting: *Deleted

## 2020-05-22 DIAGNOSIS — L718 Other rosacea: Secondary | ICD-10-CM | POA: Diagnosis not present

## 2020-05-22 DIAGNOSIS — I6523 Occlusion and stenosis of bilateral carotid arteries: Secondary | ICD-10-CM

## 2020-05-22 DIAGNOSIS — L820 Inflamed seborrheic keratosis: Secondary | ICD-10-CM | POA: Diagnosis not present

## 2020-05-26 DIAGNOSIS — Z6831 Body mass index (BMI) 31.0-31.9, adult: Secondary | ICD-10-CM | POA: Diagnosis not present

## 2020-05-26 DIAGNOSIS — E669 Obesity, unspecified: Secondary | ICD-10-CM | POA: Diagnosis not present

## 2020-05-26 DIAGNOSIS — M1990 Unspecified osteoarthritis, unspecified site: Secondary | ICD-10-CM | POA: Diagnosis not present

## 2020-05-26 DIAGNOSIS — G894 Chronic pain syndrome: Secondary | ICD-10-CM | POA: Diagnosis not present

## 2020-05-26 DIAGNOSIS — M150 Primary generalized (osteo)arthritis: Secondary | ICD-10-CM | POA: Diagnosis not present

## 2020-05-26 DIAGNOSIS — M791 Myalgia, unspecified site: Secondary | ICD-10-CM | POA: Diagnosis not present

## 2020-05-26 DIAGNOSIS — I2510 Atherosclerotic heart disease of native coronary artery without angina pectoris: Secondary | ICD-10-CM | POA: Diagnosis not present

## 2020-05-26 DIAGNOSIS — M154 Erosive (osteo)arthritis: Secondary | ICD-10-CM | POA: Diagnosis not present

## 2020-05-26 MED ORDER — ROSUVASTATIN CALCIUM 10 MG PO TABS: ORAL_TABLET | ORAL | 3 refills | Status: DC

## 2020-05-26 NOTE — Telephone Encounter (Signed)
Spoke with patient.  Would like to get back on rosuvastatin 10 mg weekly.  Also willing to try Praluent 75 mg.  Had side effects when tried Repatha.  Will leave sample of Praluent for patient to pick up and send rosuvastatin to [facility].

## 2020-05-27 DIAGNOSIS — H04123 Dry eye syndrome of bilateral lacrimal glands: Secondary | ICD-10-CM | POA: Diagnosis not present

## 2020-05-27 DIAGNOSIS — H524 Presbyopia: Secondary | ICD-10-CM | POA: Diagnosis not present

## 2020-05-27 DIAGNOSIS — E119 Type 2 diabetes mellitus without complications: Secondary | ICD-10-CM | POA: Diagnosis not present

## 2020-05-27 DIAGNOSIS — Z79899 Other long term (current) drug therapy: Secondary | ICD-10-CM | POA: Diagnosis not present

## 2020-06-02 DIAGNOSIS — Z79899 Other long term (current) drug therapy: Secondary | ICD-10-CM | POA: Diagnosis not present

## 2020-06-04 ENCOUNTER — Other Ambulatory Visit: Payer: Self-pay

## 2020-06-04 ENCOUNTER — Ambulatory Visit (INDEPENDENT_AMBULATORY_CARE_PROVIDER_SITE_OTHER): Payer: HMO | Admitting: Bariatrics

## 2020-06-04 ENCOUNTER — Encounter (INDEPENDENT_AMBULATORY_CARE_PROVIDER_SITE_OTHER): Payer: Self-pay | Admitting: Bariatrics

## 2020-06-04 VITALS — BP 103/62 | HR 60 | Temp 98.1°F | Ht 62.0 in | Wt 175.0 lb

## 2020-06-04 DIAGNOSIS — E1169 Type 2 diabetes mellitus with other specified complication: Secondary | ICD-10-CM | POA: Diagnosis not present

## 2020-06-04 DIAGNOSIS — Z6832 Body mass index (BMI) 32.0-32.9, adult: Secondary | ICD-10-CM | POA: Diagnosis not present

## 2020-06-04 DIAGNOSIS — E7849 Other hyperlipidemia: Secondary | ICD-10-CM

## 2020-06-04 DIAGNOSIS — E669 Obesity, unspecified: Secondary | ICD-10-CM | POA: Diagnosis not present

## 2020-06-04 DIAGNOSIS — G4733 Obstructive sleep apnea (adult) (pediatric): Secondary | ICD-10-CM | POA: Diagnosis not present

## 2020-06-04 NOTE — Progress Notes (Signed)
Chief Complaint:   OBESITY Sandra [name] is here to discuss her progress with her obesity treatment plan along with follow-up of her obesity related diagnoses. Sandra [name] is on the Category 1 Plan and states she is following her eating plan approximately 80% of the time. Sandra [name] states she is walking 7,000 steps 6 times per week.   Today's visit was #: 33 Starting weight: 180 lbs Starting date: 05/28/2018 Today's weight: 175 lbs Today's date: 06/04/2020 Total lbs lost to date: 5 Total lbs lost since last in-office visit: 2  Interim History: Sandra [name] is down 2 lbs since her last visit. She is getting back on track, and is getting in water and protein.  Subjective:   1. Diabetes mellitus type 2 in obese (HCC) Sandra [name]'s fasting BGs were 108 today, and ranges between 120-130's. She is taking [place] and Xultophy.  2. Other hyperlipidemia Sandra [name] is taking Crestor, and she denies myalgias.  Assessment/Plan:   1. Diabetes mellitus type 2 in obese (HCC) Good blood sugar control is important to decrease the likelihood of diabetic complications such as nephropathy, neuropathy, limb loss, blindness, coronary artery disease, and death. Intensive lifestyle modification including diet, exercise and weight loss are the first line of treatment for diabetes. Syna will continue her medications, and will continue to follow up as directed.  2. Other hyperlipidemia Cardiovascular risk and specific lipid/LDL goals reviewed. We discussed several lifestyle modifications today. [name] will continue Crestor, and continue to work on diet, exercise and weight loss efforts. She is to decrease trans fats and mod saturated fats. Orders and follow up as documented in patient record.   Counseling Intensive lifestyle modifications are the first line treatment for this issue. . Dietary changes: Increase soluble fiber. Decrease simple carbohydrates. . Exercise changes: Moderate to vigorous-intensity aerobic activity 150  minutes per week if tolerated. . Lipid-lowering medications: see documented in medical record.  3. Class 1 obesity with serious comorbidity and body mass index (BMI) of 32.0 to 32.9 in adult, unspecified obesity type Sandra [name] is currently in the action stage of change. As such, her goal is to continue with weight loss efforts. She has agreed to the Category 1 Plan.   Exercise goals: As is.  Behavioral modification strategies: increasing lean protein intake, decreasing simple carbohydrates, increasing vegetables, increasing water intake, decreasing eating out, no skipping meals, meal planning and cooking strategies, keeping healthy foods in the home and planning for success.  Sandra [name] has agreed to follow-up with our clinic in 2 to 3 weeks. She was informed of the importance of frequent follow-up visits to maximize her success with intensive lifestyle modifications for her multiple health conditions.   Objective:   Blood pressure 103/62, pulse 60, temperature 98.1 F (36.7 C), height 5\' 2"  (1.575 m), weight 175 lb (79.4 kg), SpO2 96 %. Body mass index is 32.01 kg/m.  General: Cooperative, alert, well developed, in no acute distress. HEENT: Conjunctivae and lids unremarkable. Cardiovascular: Regular rhythm.  Lungs: Normal work of breathing. Neurologic: No focal deficits.   Lab Results  Component Value Date   CREATININE 0.95 04/16/2020   BUN 23 04/16/2020   NA 142 04/16/2020   K 4.6 04/16/2020   CL 105 04/16/2020   CO2 23 04/16/2020   Lab Results  Component Value Date   ALT 24 04/16/2020   AST 25 04/16/2020   ALKPHOS 66 04/16/2020   BILITOT 0.5 04/16/2020   Lab Results  Component Value Date   HGBA1C 7.4 (H) 04/16/2020   HGBA1C 7.4 (H)  01/07/2020   HGBA1C 7.1 (H) 07/09/2019   HGBA1C 6.9 (H) 11/14/2018   Lab Results  Component Value Date   INSULIN 8.9 04/16/2020   Lab Results  Component Value Date   TSH 2.100 07/09/2019   Lab Results  Component Value Date   CHOL 187  04/16/2020   HDL 48 04/16/2020   LDLCALC 114 (H) 04/16/2020   TRIG 142 04/16/2020   CHOLHDL 2.9 12/20/2018   Lab Results  Component Value Date   WBC 7.6 01/07/2020   HGB 15.3 01/07/2020   HCT 46.3 01/07/2020   MCV 93 01/07/2020   PLT 182 01/07/2020   No results found for: IRON, TIBC, FERRITIN  Obesity Behavioral Intervention:   Approximately 15 minutes were spent on the discussion below.  ASK: We discussed the diagnosis of obesity with Sandra [name] today and Sandra [name] agreed to give Sandra [name] permission to discuss obesity behavioral modification therapy today.  ASSESS: Sandra [name] has the diagnosis of obesity and her BMI today is 73. Sandra [name] is in the action stage of change.   ADVISE: Sandra [name] was educated on the multiple health risks of obesity as well as the benefit of weight loss to improve her health. She was advised of the need for long term treatment and the importance of lifestyle modifications to improve her current health and to decrease her risk of future health problems.  AGREE: Multiple dietary modification options and treatment options were discussed and Sandra [name] agreed to follow the recommendations documented in the above note.  ARRANGE: Sandra [name] was educated on the importance of frequent visits to treat obesity as outlined per CMS and USPSTF guidelines and agreed to schedule her next follow up appointment today.  Attestation Statements:   Reviewed by clinician on day of visit: allergies, medications, problem list, medical history, surgical history, family history, social history, and previous encounter notes.   Sandra [name], am acting as Location manager for [employer], DO.  I have reviewed the above documentation for accuracy and completeness, and I agree with the above. Sandra Lesch, DO

## 2020-06-11 NOTE — Telephone Encounter (Signed)
Routing info to [employer].

## 2020-06-15 ENCOUNTER — Other Ambulatory Visit (INDEPENDENT_AMBULATORY_CARE_PROVIDER_SITE_OTHER): Payer: Self-pay | Admitting: Bariatrics

## 2020-06-16 ENCOUNTER — Telehealth: Payer: Self-pay

## 2020-06-16 DIAGNOSIS — E7849 Other hyperlipidemia: Secondary | ICD-10-CM

## 2020-06-16 MED ORDER — PRALUENT 75 MG/ML ~~LOC~~ SOAJ: 75.0000 mg | SUBCUTANEOUS | 3 refills | Status: DC

## 2020-06-16 NOTE — Telephone Encounter (Signed)
Called and informed pt that they were approved for the praluent 75mg , rx sent, pt informed to come for fasting labs after 4th injection, pt voiced understanding

## 2020-06-22 DIAGNOSIS — E782 Mixed hyperlipidemia: Secondary | ICD-10-CM | POA: Diagnosis not present

## 2020-06-22 DIAGNOSIS — E1159 Type 2 diabetes mellitus with other circulatory complications: Secondary | ICD-10-CM | POA: Diagnosis not present

## 2020-06-22 DIAGNOSIS — M159 Polyosteoarthritis, unspecified: Secondary | ICD-10-CM | POA: Diagnosis not present

## 2020-06-22 DIAGNOSIS — I2510 Atherosclerotic heart disease of native coronary artery without angina pectoris: Secondary | ICD-10-CM | POA: Diagnosis not present

## 2020-06-22 DIAGNOSIS — I10 Essential (primary) hypertension: Secondary | ICD-10-CM | POA: Diagnosis not present

## 2020-06-22 DIAGNOSIS — E1165 Type 2 diabetes mellitus with hyperglycemia: Secondary | ICD-10-CM | POA: Diagnosis not present

## 2020-06-22 DIAGNOSIS — M1990 Unspecified osteoarthritis, unspecified site: Secondary | ICD-10-CM | POA: Diagnosis not present

## 2020-06-23 DIAGNOSIS — K625 Hemorrhage of anus and rectum: Secondary | ICD-10-CM | POA: Diagnosis not present

## 2020-06-25 ENCOUNTER — Ambulatory Visit (INDEPENDENT_AMBULATORY_CARE_PROVIDER_SITE_OTHER): Payer: HMO | Admitting: Bariatrics

## 2020-06-25 ENCOUNTER — Other Ambulatory Visit: Payer: Self-pay

## 2020-06-25 ENCOUNTER — Encounter (INDEPENDENT_AMBULATORY_CARE_PROVIDER_SITE_OTHER): Payer: Self-pay | Admitting: Bariatrics

## 2020-06-25 VITALS — BP 115/70 | HR 60 | Temp 98.1°F | Ht 62.0 in | Wt 175.0 lb

## 2020-06-25 DIAGNOSIS — E785 Hyperlipidemia, unspecified: Secondary | ICD-10-CM | POA: Diagnosis not present

## 2020-06-25 DIAGNOSIS — E669 Obesity, unspecified: Secondary | ICD-10-CM | POA: Diagnosis not present

## 2020-06-25 DIAGNOSIS — Z6832 Body mass index (BMI) 32.0-32.9, adult: Secondary | ICD-10-CM | POA: Diagnosis not present

## 2020-06-25 DIAGNOSIS — E1169 Type 2 diabetes mellitus with other specified complication: Secondary | ICD-10-CM

## 2020-06-25 DIAGNOSIS — Z6831 Body mass index (BMI) 31.0-31.9, adult: Secondary | ICD-10-CM

## 2020-06-29 NOTE — Progress Notes (Signed)
Chief Complaint:   Sandra [name] is here to discuss her progress with her obesity treatment plan along with follow-up of her obesity related diagnoses. Sandra [name] is on the Category 1 Plan and states she is following her eating plan approximately 50% of the time. Sandra [name] states she is exercising 0 minutes 0 times per week.  Today's visit was #: 62 Starting weight: 180 lbs Starting date: 05/28/2018 Today's weight: 175 lbs Today's date: 06/25/2020 Total lbs lost to date: 5 Total lbs lost since last in-office visit: 0  Interim History: Sandra [name]'s weight remains the same. She reports being sick over the weekend.  Subjective:   Hyperlipidemia associated with type 2 diabetes mellitus ([place]). Sandra [name] is taking Zetia and Crestor.  Lab Results  Component Value Date   CHOL 187 04/16/2020   HDL 48 04/16/2020   LDLCALC 114 (H) 04/16/2020   TRIG 142 04/16/2020   CHOLHDL 2.9 12/20/2018   Lab Results  Component Value Date   ALT 24 04/16/2020   AST 25 04/16/2020   ALKPHOS 66 04/16/2020   BILITOT 0.5 04/16/2020   The 10-year ASCVD risk score Mikey Bussing DC Jr., et al., 2013) is: 33.5%   Values used to calculate the score:     Age: 36 years     Sex: Female     Is Non-Hispanic African American: No     Diabetic: Yes     Tobacco smoker: No     Systolic Blood Pressure: 413 mmHg     Is BP treated: Yes     HDL Cholesterol: 48 mg/dL     Total Cholesterol: 187 mg/dL  Type 2 diabetes mellitus with other specified complication, without long-term current use of insulin ([name]). Sandra [name] is taking Sandra [name] and Xultophy. Fasting blood sugars are reported to be in the 120's with occasional 140-150's.  Lab Results  Component Value Date   HGBA1C 7.4 (H) 04/16/2020   HGBA1C 7.4 (H) 01/07/2020   HGBA1C 7.1 (H) 07/09/2019   Lab Results  Component Value Date   LDLCALC 114 (H) 04/16/2020   CREATININE 0.95 04/16/2020   Lab Results  Component Value Date   INSULIN 8.9 04/16/2020   Assessment/Plan:     Hyperlipidemia associated with type 2 diabetes mellitus ([name]). Cardiovascular risk and specific lipid/LDL goals reviewed.  We discussed several lifestyle modifications today and [name] will continue to work on diet, exercise and weight loss efforts. Orders and follow up as documented in patient record. She will continue her medications as directed.   Counseling Intensive lifestyle modifications are the first line treatment for this issue. . Dietary changes: Increase soluble fiber. Decrease simple carbohydrates. . Exercise changes: Moderate to vigorous-intensity aerobic activity 150 minutes per week if tolerated. . Lipid-lowering medications: see documented in medical record.  Type 2 diabetes mellitus with other specified complication, without long-term current use of insulin ([name]). Good blood sugar control is important to decrease the likelihood of diabetic complications such as nephropathy, neuropathy, limb loss, blindness, coronary artery disease, and death. Intensive lifestyle modification including diet, exercise and weight loss are the first line of treatment for diabetes. Sandra [name] will continue her medications as directed.   Class 1 obesity with serious comorbidity and body mass index (BMI) of 32.0 to 32.9 in adult, unspecified obesity type.  Sandra [name] is currently in the action stage of change. As such, her goal is to continue with weight loss efforts. She has agreed to the Category 1 Plan.   She will work on meal planning, intentional  eating, and adhering more closely to the plan.  Exercise goals: Sandra [name] will walk with cooler weather.  Behavioral modification strategies: increasing lean protein intake, decreasing simple carbohydrates, increasing vegetables, increasing water intake, decreasing eating out, no skipping meals, meal planning and cooking strategies, keeping healthy foods in the home and planning for success.  Sandra [name] has agreed to follow-up with our clinic in 3 weeks. She was  informed of the importance of frequent follow-up visits to maximize her success with intensive lifestyle modifications for her multiple health conditions.   Objective:   Blood pressure 115/70, pulse 60, temperature 98.1 F (36.7 C), height 5\' 2"  (1.575 m), weight 175 lb (79.4 kg), SpO2 96 %. Body mass index is 32.01 kg/m.  General: Cooperative, alert, well developed, in no acute distress. HEENT: Conjunctivae and lids unremarkable. Cardiovascular: Regular rhythm.  Lungs: Normal work of breathing. Neurologic: No focal deficits.   Lab Results  Component Value Date   CREATININE 0.95 04/16/2020   BUN 23 04/16/2020   NA 142 04/16/2020   K 4.6 04/16/2020   CL 105 04/16/2020   CO2 23 04/16/2020   Lab Results  Component Value Date   ALT 24 04/16/2020   AST 25 04/16/2020   ALKPHOS 66 04/16/2020   BILITOT 0.5 04/16/2020   Lab Results  Component Value Date   HGBA1C 7.4 (H) 04/16/2020   HGBA1C 7.4 (H) 01/07/2020   HGBA1C 7.1 (H) 07/09/2019   HGBA1C 6.9 (H) 11/14/2018   Lab Results  Component Value Date   INSULIN 8.9 04/16/2020   Lab Results  Component Value Date   TSH 2.100 07/09/2019   Lab Results  Component Value Date   CHOL 187 04/16/2020   HDL 48 04/16/2020   LDLCALC 114 (H) 04/16/2020   TRIG 142 04/16/2020   CHOLHDL 2.9 12/20/2018   Lab Results  Component Value Date   WBC 7.6 01/07/2020   HGB 15.3 01/07/2020   HCT 46.3 01/07/2020   MCV 93 01/07/2020   PLT 182 01/07/2020   No results found for: IRON, TIBC, FERRITIN  Obesity Behavioral Intervention:   Approximately 15 minutes were spent on the discussion below.  ASK: We discussed the diagnosis of obesity with Sandra [name] today and Sandra [name] agreed to give [name] permission to discuss obesity behavioral modification therapy today.  ASSESS: Sandra [name] has the diagnosis of obesity and her BMI today is 32.1. Sandra [name] is in the action stage of change.   ADVISE: Sandra [name] was educated on the multiple health risks of obesity as  well as the benefit of weight loss to improve her health. She was advised of the need for long term treatment and the importance of lifestyle modifications to improve her current health and to decrease her risk of future health problems.  AGREE: Multiple dietary modification options and treatment options were discussed and Schelly agreed to follow the recommendations documented in the above note.  ARRANGE: Tyana was educated on the importance of frequent visits to treat obesity as outlined per CMS and USPSTF guidelines and agreed to schedule her next follow up appointment today.  Attestation Statements:   Reviewed by clinician on day of visit: allergies, medications, problem list, medical history, surgical history, family history, social history, and previous encounter notes.  Migdalia Dk, am acting as Location manager for [employer], DO   I have reviewed the above documentation for accuracy and completeness, and I agree with the above. [name], DO

## 2020-07-02 ENCOUNTER — Other Ambulatory Visit: Payer: Self-pay

## 2020-07-02 NOTE — Patient Outreach (Addendum)
  [facility]) Care Management Chronic Special Needs Program    07/02/2020  Name: Sandra [name], DOB: 12-04-1945  MRN: [id number]   Ms. Cardelia Sassano is enrolled in a chronic special needs plan.    Member will be followed by the [facility] case management team beginning September 05, 2020.  Case is closed by [facility] [place] management, will reopen if needed before September 05, 2020.   Addendum: Graham will continue to follow client through 09/04/20.  Client will be followed by the [facility] case management team beginning September 05, 2020.   Quinn Plowman RN,BSN,CCM [facility] Care Management [phone]

## 2020-07-04 ENCOUNTER — Encounter (INDEPENDENT_AMBULATORY_CARE_PROVIDER_SITE_OTHER): Payer: Self-pay | Admitting: Bariatrics

## 2020-07-06 ENCOUNTER — Other Ambulatory Visit (INDEPENDENT_AMBULATORY_CARE_PROVIDER_SITE_OTHER): Payer: Self-pay | Admitting: Bariatrics

## 2020-07-06 DIAGNOSIS — F5089 Other specified eating disorder: Secondary | ICD-10-CM

## 2020-07-06 NOTE — Telephone Encounter (Signed)
Dr Owens Shark pt

## 2020-07-14 DIAGNOSIS — M159 Polyosteoarthritis, unspecified: Secondary | ICD-10-CM | POA: Diagnosis not present

## 2020-07-14 DIAGNOSIS — E1165 Type 2 diabetes mellitus with hyperglycemia: Secondary | ICD-10-CM | POA: Diagnosis not present

## 2020-07-14 DIAGNOSIS — E782 Mixed hyperlipidemia: Secondary | ICD-10-CM | POA: Diagnosis not present

## 2020-07-14 DIAGNOSIS — M1990 Unspecified osteoarthritis, unspecified site: Secondary | ICD-10-CM | POA: Diagnosis not present

## 2020-07-14 DIAGNOSIS — I2510 Atherosclerotic heart disease of native coronary artery without angina pectoris: Secondary | ICD-10-CM | POA: Diagnosis not present

## 2020-07-14 DIAGNOSIS — I10 Essential (primary) hypertension: Secondary | ICD-10-CM | POA: Diagnosis not present

## 2020-07-14 DIAGNOSIS — F3341 Major depressive disorder, recurrent, in partial remission: Secondary | ICD-10-CM | POA: Diagnosis not present

## 2020-07-14 DIAGNOSIS — E1159 Type 2 diabetes mellitus with other circulatory complications: Secondary | ICD-10-CM | POA: Diagnosis not present

## 2020-07-15 DIAGNOSIS — E1165 Type 2 diabetes mellitus with hyperglycemia: Secondary | ICD-10-CM | POA: Diagnosis not present

## 2020-07-15 DIAGNOSIS — E669 Obesity, unspecified: Secondary | ICD-10-CM | POA: Diagnosis not present

## 2020-07-15 DIAGNOSIS — Z794 Long term (current) use of insulin: Secondary | ICD-10-CM | POA: Diagnosis not present

## 2020-07-15 DIAGNOSIS — E1159 Type 2 diabetes mellitus with other circulatory complications: Secondary | ICD-10-CM | POA: Diagnosis not present

## 2020-07-15 DIAGNOSIS — I2510 Atherosclerotic heart disease of native coronary artery without angina pectoris: Secondary | ICD-10-CM | POA: Diagnosis not present

## 2020-07-20 ENCOUNTER — Encounter (INDEPENDENT_AMBULATORY_CARE_PROVIDER_SITE_OTHER): Payer: Self-pay | Admitting: Bariatrics

## 2020-07-20 ENCOUNTER — Ambulatory Visit (INDEPENDENT_AMBULATORY_CARE_PROVIDER_SITE_OTHER): Payer: HMO | Admitting: Bariatrics

## 2020-07-20 ENCOUNTER — Other Ambulatory Visit: Payer: Self-pay

## 2020-07-20 VITALS — BP 121/62 | HR 70 | Temp 97.8°F | Ht 62.0 in | Wt 174.0 lb

## 2020-07-20 DIAGNOSIS — E785 Hyperlipidemia, unspecified: Secondary | ICD-10-CM

## 2020-07-20 DIAGNOSIS — I10 Essential (primary) hypertension: Secondary | ICD-10-CM | POA: Diagnosis not present

## 2020-07-20 DIAGNOSIS — E1169 Type 2 diabetes mellitus with other specified complication: Secondary | ICD-10-CM | POA: Diagnosis not present

## 2020-07-20 DIAGNOSIS — E669 Obesity, unspecified: Secondary | ICD-10-CM | POA: Diagnosis not present

## 2020-07-20 DIAGNOSIS — Z6831 Body mass index (BMI) 31.0-31.9, adult: Secondary | ICD-10-CM | POA: Diagnosis not present

## 2020-07-20 NOTE — Progress Notes (Signed)
Chief Complaint:   Sandra [name] is here to discuss her progress with her obesity treatment plan along with follow-up of her obesity related diagnoses. Sandra [name] is on the Category 1 Plan and states she is following her eating plan approximately 70% of the time. Sandra [name] states she is walking 6,000 steps daily 6 times per week.  Today's visit was #: 81 Starting weight: 180 lbs Starting date: 05/28/2018 Today's weight: 174 lbs Today's date: 07/20/2020 Total lbs lost to date: 6 Total lbs lost since last in-office visit: 1  Interim History: Sandra [name] is down 1 lb. She is having arthritis pain. She has started a statin.  Subjective:   Essential hypertension. Sandra [name] is taking Zestril and Toprol.  BP Readings from Last 3 Encounters:  07/20/20 121/62  06/25/20 115/70  06/04/20 103/62   Lab Results  Component Value Date   CREATININE 0.95 04/16/2020   CREATININE 0.85 01/07/2020   CREATININE 0.97 07/09/2019   Hyperlipidemia associated with type 2 diabetes mellitus ([place]). Sandra [name] is taking Zetia and Crestor. She started taking Praluent and is having muscle cramps.  Lab Results  Component Value Date   CHOL 187 04/16/2020   HDL 48 04/16/2020   LDLCALC 114 (H) 04/16/2020   TRIG 142 04/16/2020   CHOLHDL 2.9 12/20/2018   Lab Results  Component Value Date   ALT 24 04/16/2020   AST 25 04/16/2020   ALKPHOS 66 04/16/2020   BILITOT 0.5 04/16/2020   The 10-year ASCVD risk score Mikey Bussing DC Jr., et al., 2013) is: 36.4%   Values used to calculate the score:     Age: 74 years     Sex: Female     Is Non-Hispanic African American: No     Diabetic: Yes     Tobacco smoker: No     Systolic Blood Pressure: 474 mmHg     Is BP treated: Yes     HDL Cholesterol: 48 mg/dL     Total Cholesterol: 187 mg/dL  Assessment/Plan:   Essential hypertension. Sandra [name] is working on healthy weight loss and exercise to improve blood pressure control. We will watch for signs of hypotension as she  continues her lifestyle modifications.  She will continue her medications as directed.   Hyperlipidemia associated with type 2 diabetes mellitus ([place]). Cardiovascular risk and specific lipid/LDL goals reviewed.  We discussed several lifestyle modifications today and Sandra [name] will continue to work on diet, exercise and weight loss efforts. Orders and follow up as documented in patient record. She will continue her medications as directed. She will notify her PCP regarding cramping with new medication (Praluent).  Counseling Intensive lifestyle modifications are the first line treatment for this issue. . Dietary changes: Increase soluble fiber. Decrease simple carbohydrates. . Exercise changes: Moderate to vigorous-intensity aerobic activity 150 minutes per week if tolerated. . Lipid-lowering medications: see documented in medical record.  Class 1 obesity with serious comorbidity and body mass index (BMI) of 31.0 to 31.9 in adult, unspecified obesity type.  Sandra [name] is currently in the action stage of change. As such, her goal is to continue with weight loss efforts. She has agreed to the Category 1 Plan.   She will work on meal planning and intentional eating.   Exercise goals: Sandra [name] will continue walking 6,000 steps daily and will get back to the gym.  Behavioral modification strategies: increasing lean protein intake, decreasing simple carbohydrates, increasing vegetables, increasing water intake, decreasing eating out, no skipping meals, meal planning and cooking strategies, keeping healthy  foods in the home and planning for success.  Sandra [name] has agreed to follow-up with our clinic in 4 weeks. She was informed of the importance of frequent follow-up visits to maximize her success with intensive lifestyle modifications for her multiple health conditions.   Objective:   Blood pressure 121/62, pulse 70, temperature 97.8 F (36.6 C), height 5\' 2"  (1.575 m), weight 174 lb (78.9 kg), SpO2 94  %. Body mass index is 31.83 kg/m.  General: Cooperative, alert, well developed, in no acute distress. HEENT: Conjunctivae and lids unremarkable. Cardiovascular: Regular rhythm.  Lungs: Normal work of breathing. Neurologic: No focal deficits.   Lab Results  Component Value Date   CREATININE 0.95 04/16/2020   BUN 23 04/16/2020   NA 142 04/16/2020   K 4.6 04/16/2020   CL 105 04/16/2020   CO2 23 04/16/2020   Lab Results  Component Value Date   ALT 24 04/16/2020   AST 25 04/16/2020   ALKPHOS 66 04/16/2020   BILITOT 0.5 04/16/2020   Lab Results  Component Value Date   HGBA1C 7.4 (H) 04/16/2020   HGBA1C 7.4 (H) 01/07/2020   HGBA1C 7.1 (H) 07/09/2019   HGBA1C 6.9 (H) 11/14/2018   Lab Results  Component Value Date   INSULIN 8.9 04/16/2020   Lab Results  Component Value Date   TSH 2.100 07/09/2019   Lab Results  Component Value Date   CHOL 187 04/16/2020   HDL 48 04/16/2020   LDLCALC 114 (H) 04/16/2020   TRIG 142 04/16/2020   CHOLHDL 2.9 12/20/2018   Lab Results  Component Value Date   WBC 7.6 01/07/2020   HGB 15.3 01/07/2020   HCT 46.3 01/07/2020   MCV 93 01/07/2020   PLT 182 01/07/2020   No results found for: IRON, TIBC, FERRITIN  Obesity Behavioral Intervention:   Approximately 15 minutes were spent on the discussion below.  ASK: We discussed the diagnosis of obesity with Sandra [name] today and Sandra [name] agreed to give [name] permission to discuss obesity behavioral modification therapy today.  ASSESS: Sandra [name] has the diagnosis of obesity and her BMI today is 31.8. Sandra [name] is in the action stage of change.   ADVISE: Sandra [name] was educated on the multiple health risks of obesity as well as the benefit of weight loss to improve her health. She was advised of the need for long term treatment and the importance of lifestyle modifications to improve her current health and to decrease her risk of future health problems.  AGREE: Multiple dietary modification options and  treatment options were discussed and Sandra [name] agreed to follow the recommendations documented in the above note.  ARRANGE: Sandra [name] was educated on the importance of frequent visits to treat obesity as outlined per CMS and USPSTF guidelines and agreed to schedule her next follow up appointment today.  Attestation Statements:   Reviewed by clinician on day of visit: allergies, medications, problem list, medical history, surgical history, family history, social history, and previous encounter notes.  Migdalia Dk, am acting as Location manager for [employer], DO   I have reviewed the above documentation for accuracy and completeness, and I agree with the above. [name], DO

## 2020-07-21 ENCOUNTER — Encounter (INDEPENDENT_AMBULATORY_CARE_PROVIDER_SITE_OTHER): Payer: Self-pay | Admitting: Bariatrics

## 2020-07-27 DIAGNOSIS — G4733 Obstructive sleep apnea (adult) (pediatric): Secondary | ICD-10-CM | POA: Diagnosis not present

## 2020-08-10 DIAGNOSIS — F3341 Major depressive disorder, recurrent, in partial remission: Secondary | ICD-10-CM | POA: Diagnosis not present

## 2020-08-10 DIAGNOSIS — I10 Essential (primary) hypertension: Secondary | ICD-10-CM | POA: Diagnosis not present

## 2020-08-10 DIAGNOSIS — Z01419 Encounter for gynecological examination (general) (routine) without abnormal findings: Secondary | ICD-10-CM | POA: Diagnosis not present

## 2020-08-10 DIAGNOSIS — E2839 Other primary ovarian failure: Secondary | ICD-10-CM | POA: Diagnosis not present

## 2020-08-10 DIAGNOSIS — F17219 Nicotine dependence, cigarettes, with unspecified nicotine-induced disorders: Secondary | ICD-10-CM | POA: Diagnosis not present

## 2020-08-10 DIAGNOSIS — Z794 Long term (current) use of insulin: Secondary | ICD-10-CM | POA: Diagnosis not present

## 2020-08-10 DIAGNOSIS — I2510 Atherosclerotic heart disease of native coronary artery without angina pectoris: Secondary | ICD-10-CM | POA: Diagnosis not present

## 2020-08-10 DIAGNOSIS — E782 Mixed hyperlipidemia: Secondary | ICD-10-CM | POA: Diagnosis not present

## 2020-08-10 DIAGNOSIS — E1159 Type 2 diabetes mellitus with other circulatory complications: Secondary | ICD-10-CM | POA: Diagnosis not present

## 2020-08-10 DIAGNOSIS — Z0000 Encounter for general adult medical examination without abnormal findings: Secondary | ICD-10-CM | POA: Diagnosis not present

## 2020-08-10 DIAGNOSIS — I6523 Occlusion and stenosis of bilateral carotid arteries: Secondary | ICD-10-CM | POA: Diagnosis not present

## 2020-08-10 DIAGNOSIS — R3915 Urgency of urination: Secondary | ICD-10-CM | POA: Diagnosis not present

## 2020-08-18 DIAGNOSIS — M5030 Other cervical disc degeneration, unspecified cervical region: Secondary | ICD-10-CM | POA: Diagnosis not present

## 2020-08-18 DIAGNOSIS — M9901 Segmental and somatic dysfunction of cervical region: Secondary | ICD-10-CM | POA: Diagnosis not present

## 2020-08-18 DIAGNOSIS — I10 Essential (primary) hypertension: Secondary | ICD-10-CM | POA: Diagnosis not present

## 2020-08-18 DIAGNOSIS — F3341 Major depressive disorder, recurrent, in partial remission: Secondary | ICD-10-CM | POA: Diagnosis not present

## 2020-08-18 DIAGNOSIS — G243 Spasmodic torticollis: Secondary | ICD-10-CM | POA: Diagnosis not present

## 2020-08-18 DIAGNOSIS — E1159 Type 2 diabetes mellitus with other circulatory complications: Secondary | ICD-10-CM | POA: Diagnosis not present

## 2020-08-18 DIAGNOSIS — M1990 Unspecified osteoarthritis, unspecified site: Secondary | ICD-10-CM | POA: Diagnosis not present

## 2020-08-18 DIAGNOSIS — E1165 Type 2 diabetes mellitus with hyperglycemia: Secondary | ICD-10-CM | POA: Diagnosis not present

## 2020-08-18 DIAGNOSIS — M9903 Segmental and somatic dysfunction of lumbar region: Secondary | ICD-10-CM | POA: Diagnosis not present

## 2020-08-18 DIAGNOSIS — E782 Mixed hyperlipidemia: Secondary | ICD-10-CM | POA: Diagnosis not present

## 2020-08-18 DIAGNOSIS — M159 Polyosteoarthritis, unspecified: Secondary | ICD-10-CM | POA: Diagnosis not present

## 2020-08-18 DIAGNOSIS — I2510 Atherosclerotic heart disease of native coronary artery without angina pectoris: Secondary | ICD-10-CM | POA: Diagnosis not present

## 2020-08-19 ENCOUNTER — Other Ambulatory Visit: Payer: Self-pay

## 2020-08-19 ENCOUNTER — Ambulatory Visit (INDEPENDENT_AMBULATORY_CARE_PROVIDER_SITE_OTHER): Payer: HMO | Admitting: Bariatrics

## 2020-08-19 ENCOUNTER — Encounter (INDEPENDENT_AMBULATORY_CARE_PROVIDER_SITE_OTHER): Payer: Self-pay | Admitting: Bariatrics

## 2020-08-19 VITALS — BP 117/70 | HR 75 | Temp 97.9°F | Ht 62.0 in | Wt 175.0 lb

## 2020-08-19 DIAGNOSIS — E669 Obesity, unspecified: Secondary | ICD-10-CM | POA: Diagnosis not present

## 2020-08-19 DIAGNOSIS — Z6832 Body mass index (BMI) 32.0-32.9, adult: Secondary | ICD-10-CM

## 2020-08-19 DIAGNOSIS — M9903 Segmental and somatic dysfunction of lumbar region: Secondary | ICD-10-CM | POA: Diagnosis not present

## 2020-08-19 DIAGNOSIS — G243 Spasmodic torticollis: Secondary | ICD-10-CM | POA: Diagnosis not present

## 2020-08-19 DIAGNOSIS — E785 Hyperlipidemia, unspecified: Secondary | ICD-10-CM | POA: Diagnosis not present

## 2020-08-19 DIAGNOSIS — E1169 Type 2 diabetes mellitus with other specified complication: Secondary | ICD-10-CM | POA: Diagnosis not present

## 2020-08-19 DIAGNOSIS — M9901 Segmental and somatic dysfunction of cervical region: Secondary | ICD-10-CM | POA: Diagnosis not present

## 2020-08-19 DIAGNOSIS — M5030 Other cervical disc degeneration, unspecified cervical region: Secondary | ICD-10-CM | POA: Diagnosis not present

## 2020-08-19 NOTE — Progress Notes (Signed)
Chief Complaint:   Sandra [name] is here to discuss her progress with her obesity treatment plan along with follow-up of her obesity related diagnoses. Sandra [name] is on the Category 1 Plan and states she is following her eating plan approximately 60% of the time. Sandra [name] states she is exercising 0 minutes 0 times per week.  Today's visit was #: 86 Starting weight: 180 lbs Starting date: 05/28/2018 Today's weight: 175 lbs Today's date: 08/19/2020 Total lbs lost to date: 5 Total lbs lost since last in-office visit: 0  Interim History: Sandra [name] is up 1 lb since her last visit. She is having back pain. She reports doing well with her water intake.  Subjective:   Diabetes mellitus type 2 in obese ([place]). Sandra [name] is on [place]. Fasting blood sugars are reported to be 140-150's.  Lab Results  Component Value Date   HGBA1C 7.4 (H) 04/16/2020   HGBA1C 7.4 (H) 01/07/2020   HGBA1C 7.1 (H) 07/09/2019   Lab Results  Component Value Date   LDLCALC 114 (H) 04/16/2020   CREATININE 0.95 04/16/2020   Lab Results  Component Value Date   INSULIN 8.9 04/16/2020   Hyperlipidemia associated with type 2 diabetes mellitus (Sandra [name]). Sandra [name] is taking Zetia and Crestor.  Lab Results  Component Value Date   CHOL 187 04/16/2020   HDL 48 04/16/2020   LDLCALC 114 (H) 04/16/2020   TRIG 142 04/16/2020   CHOLHDL 2.9 12/20/2018   Lab Results  Component Value Date   ALT 24 04/16/2020   AST 25 04/16/2020   ALKPHOS 66 04/16/2020   BILITOT 0.5 04/16/2020   The 10-year ASCVD risk score Sandra [name] DC Jr., et al., 2013) is: 45.6%   Values used to calculate the score:     Age: 74 years     Sex: Female     Is Non-Hispanic African American: No     Diabetic: Yes     Tobacco smoker: Yes     Systolic Blood Pressure: 149 mmHg     Is BP treated: Yes     HDL Cholesterol: 48 mg/dL     Total Cholesterol: 187 mg/dL  Assessment/Plan:   Diabetes mellitus type 2 in obese (Sandra [name]). Good blood sugar  control is important to decrease the likelihood of diabetic complications such as nephropathy, neuropathy, limb loss, blindness, coronary artery disease, and death. Intensive lifestyle modification including diet, exercise and weight loss are the first line of treatment for diabetes. Sandra [name] will continue her medications as directed.   Hyperlipidemia associated with type 2 diabetes mellitus (Sandra [name]). Cardiovascular risk and specific lipid/LDL goals reviewed.  We discussed several lifestyle modifications today and Sandra [name] will continue to work on diet, exercise and weight loss efforts. Orders and follow up as documented in patient record. Sandra [name] will continue her medications as directed.   Counseling Intensive lifestyle modifications are the first line treatment for this issue. . Dietary changes: Increase soluble fiber. Decrease simple carbohydrates. . Exercise changes: Moderate to vigorous-intensity aerobic activity 150 minutes per week if tolerated. . Lipid-lowering medications: see documented in medical record.  Class 1 obesity with serious comorbidity and body mass index (BMI) of 32.0 to 32.9 in adult, unspecified obesity type.  Sandra [name] is currently in the action stage of change. As such, her goal is to continue with weight loss efforts. She has agreed to the Category 1 Plan.   She will work on meal planning, mindful eating, and increasing her protein intake (protein sheet given).  We discussed  snacking (healthy snacks).  Exercise goals: Sandra [name] is not as active due to back pain; will see the chiropractor.  Behavioral modification strategies: increasing lean protein intake, decreasing simple carbohydrates, increasing vegetables, increasing water intake, decreasing eating out, no skipping meals, meal planning and cooking strategies, keeping healthy foods in the home, dealing with family or coworker sabotage, travel eating strategies, holiday eating strategies , celebration eating strategies, avoiding  temptations and planning for success.  Sandra [name] has agreed to follow-up with our clinic in 3-4 weeks. She was informed of the importance of frequent follow-up visits to maximize her success with intensive lifestyle modifications for her multiple health conditions.   Objective:   Blood pressure 117/70, pulse 75, temperature 97.9 F (36.6 C), height 5\' 2"  (1.575 m), weight 175 lb (79.4 kg), SpO2 90 %. Body mass index is 32.01 kg/m.  General: Cooperative, alert, well developed, in no acute distress. HEENT: Conjunctivae and lids unremarkable. Cardiovascular: Regular rhythm.  Lungs: Normal work of breathing. Neurologic: No focal deficits.   Lab Results  Component Value Date   CREATININE 0.95 04/16/2020   BUN 23 04/16/2020   NA 142 04/16/2020   K 4.6 04/16/2020   CL 105 04/16/2020   CO2 23 04/16/2020   Lab Results  Component Value Date   ALT 24 04/16/2020   AST 25 04/16/2020   ALKPHOS 66 04/16/2020   BILITOT 0.5 04/16/2020   Lab Results  Component Value Date   HGBA1C 7.4 (H) 04/16/2020   HGBA1C 7.4 (H) 01/07/2020   HGBA1C 7.1 (H) 07/09/2019   HGBA1C 6.9 (H) 11/14/2018   Lab Results  Component Value Date   INSULIN 8.9 04/16/2020   Lab Results  Component Value Date   TSH 2.100 07/09/2019   Lab Results  Component Value Date   CHOL 187 04/16/2020   HDL 48 04/16/2020   LDLCALC 114 (H) 04/16/2020   TRIG 142 04/16/2020   CHOLHDL 2.9 12/20/2018   Lab Results  Component Value Date   WBC 7.6 01/07/2020   HGB 15.3 01/07/2020   HCT 46.3 01/07/2020   MCV 93 01/07/2020   PLT 182 01/07/2020   No results found for: IRON, TIBC, FERRITIN  Obesity Behavioral Intervention:   Approximately 15 minutes were spent on the discussion below.  ASK: We discussed the diagnosis of obesity with Sandra [name] today and Sandra [name] agreed to give [name] permission to discuss obesity behavioral modification therapy today.  ASSESS: Sandra [name] has the diagnosis of obesity and her BMI today is 32.2. Sandra [name]  is in the action stage of change.   ADVISE: Sandra [name] was educated on the multiple health risks of obesity as well as the benefit of weight loss to improve her health. She was advised of the need for long term treatment and the importance of lifestyle modifications to improve her current health and to decrease her risk of future health problems.  AGREE: Multiple dietary modification options and treatment options were discussed and Sandra [name] agreed to follow the recommendations documented in the above note.  ARRANGE: Sandra [name] was educated on the importance of frequent visits to treat obesity as outlined per CMS and USPSTF guidelines and agreed to schedule her next follow up appointment today.  Attestation Statements:   Reviewed by clinician on day of visit: allergies, medications, problem list, medical history, surgical history, family history, social history, and previous encounter notes.  Migdalia Dk, am acting as Location manager for [employer], DO   I have reviewed the above documentation for accuracy and completeness, and I agree with  the above. -  .

## 2020-08-20 DIAGNOSIS — G243 Spasmodic torticollis: Secondary | ICD-10-CM | POA: Diagnosis not present

## 2020-08-20 DIAGNOSIS — M9901 Segmental and somatic dysfunction of cervical region: Secondary | ICD-10-CM | POA: Diagnosis not present

## 2020-08-20 DIAGNOSIS — M5030 Other cervical disc degeneration, unspecified cervical region: Secondary | ICD-10-CM | POA: Diagnosis not present

## 2020-08-20 DIAGNOSIS — M9903 Segmental and somatic dysfunction of lumbar region: Secondary | ICD-10-CM | POA: Diagnosis not present

## 2020-08-24 DIAGNOSIS — M5030 Other cervical disc degeneration, unspecified cervical region: Secondary | ICD-10-CM | POA: Diagnosis not present

## 2020-08-24 DIAGNOSIS — M9901 Segmental and somatic dysfunction of cervical region: Secondary | ICD-10-CM | POA: Diagnosis not present

## 2020-08-24 DIAGNOSIS — G243 Spasmodic torticollis: Secondary | ICD-10-CM | POA: Diagnosis not present

## 2020-08-24 DIAGNOSIS — M9903 Segmental and somatic dysfunction of lumbar region: Secondary | ICD-10-CM | POA: Diagnosis not present

## 2020-08-26 ENCOUNTER — Other Ambulatory Visit: Payer: Self-pay | Admitting: Family Medicine

## 2020-08-26 DIAGNOSIS — G4733 Obstructive sleep apnea (adult) (pediatric): Secondary | ICD-10-CM | POA: Diagnosis not present

## 2020-08-26 DIAGNOSIS — M5030 Other cervical disc degeneration, unspecified cervical region: Secondary | ICD-10-CM | POA: Diagnosis not present

## 2020-08-26 DIAGNOSIS — M9903 Segmental and somatic dysfunction of lumbar region: Secondary | ICD-10-CM | POA: Diagnosis not present

## 2020-08-26 DIAGNOSIS — E2839 Other primary ovarian failure: Secondary | ICD-10-CM

## 2020-08-26 DIAGNOSIS — G243 Spasmodic torticollis: Secondary | ICD-10-CM | POA: Diagnosis not present

## 2020-08-26 DIAGNOSIS — Z1231 Encounter for screening mammogram for malignant neoplasm of breast: Secondary | ICD-10-CM

## 2020-08-26 DIAGNOSIS — M9901 Segmental and somatic dysfunction of cervical region: Secondary | ICD-10-CM | POA: Diagnosis not present

## 2020-08-31 DIAGNOSIS — M150 Primary generalized (osteo)arthritis: Secondary | ICD-10-CM | POA: Diagnosis not present

## 2020-08-31 DIAGNOSIS — Z6831 Body mass index (BMI) 31.0-31.9, adult: Secondary | ICD-10-CM | POA: Diagnosis not present

## 2020-08-31 DIAGNOSIS — G4733 Obstructive sleep apnea (adult) (pediatric): Secondary | ICD-10-CM | POA: Diagnosis not present

## 2020-08-31 DIAGNOSIS — G894 Chronic pain syndrome: Secondary | ICD-10-CM | POA: Diagnosis not present

## 2020-08-31 DIAGNOSIS — M1990 Unspecified osteoarthritis, unspecified site: Secondary | ICD-10-CM | POA: Diagnosis not present

## 2020-08-31 DIAGNOSIS — M791 Myalgia, unspecified site: Secondary | ICD-10-CM | POA: Diagnosis not present

## 2020-08-31 DIAGNOSIS — E669 Obesity, unspecified: Secondary | ICD-10-CM | POA: Diagnosis not present

## 2020-08-31 DIAGNOSIS — M154 Erosive (osteo)arthritis: Secondary | ICD-10-CM | POA: Diagnosis not present

## 2020-08-31 DIAGNOSIS — I2510 Atherosclerotic heart disease of native coronary artery without angina pectoris: Secondary | ICD-10-CM | POA: Diagnosis not present

## 2020-09-03 DIAGNOSIS — M9901 Segmental and somatic dysfunction of cervical region: Secondary | ICD-10-CM | POA: Diagnosis not present

## 2020-09-03 DIAGNOSIS — M9903 Segmental and somatic dysfunction of lumbar region: Secondary | ICD-10-CM | POA: Diagnosis not present

## 2020-09-03 DIAGNOSIS — G243 Spasmodic torticollis: Secondary | ICD-10-CM | POA: Diagnosis not present

## 2020-09-03 DIAGNOSIS — M5030 Other cervical disc degeneration, unspecified cervical region: Secondary | ICD-10-CM | POA: Diagnosis not present

## 2020-09-08 ENCOUNTER — Other Ambulatory Visit: Payer: Self-pay

## 2020-09-14 ENCOUNTER — Ambulatory Visit (INDEPENDENT_AMBULATORY_CARE_PROVIDER_SITE_OTHER): Payer: HMO | Admitting: Bariatrics

## 2020-09-14 DIAGNOSIS — E1159 Type 2 diabetes mellitus with other circulatory complications: Secondary | ICD-10-CM | POA: Diagnosis not present

## 2020-09-14 DIAGNOSIS — I10 Essential (primary) hypertension: Secondary | ICD-10-CM | POA: Diagnosis not present

## 2020-09-14 DIAGNOSIS — M1990 Unspecified osteoarthritis, unspecified site: Secondary | ICD-10-CM | POA: Diagnosis not present

## 2020-09-14 DIAGNOSIS — M159 Polyosteoarthritis, unspecified: Secondary | ICD-10-CM | POA: Diagnosis not present

## 2020-09-14 DIAGNOSIS — I2510 Atherosclerotic heart disease of native coronary artery without angina pectoris: Secondary | ICD-10-CM | POA: Diagnosis not present

## 2020-09-14 DIAGNOSIS — E1165 Type 2 diabetes mellitus with hyperglycemia: Secondary | ICD-10-CM | POA: Diagnosis not present

## 2020-09-14 DIAGNOSIS — E782 Mixed hyperlipidemia: Secondary | ICD-10-CM | POA: Diagnosis not present

## 2020-09-14 DIAGNOSIS — F3341 Major depressive disorder, recurrent, in partial remission: Secondary | ICD-10-CM | POA: Diagnosis not present

## 2020-09-17 DIAGNOSIS — L718 Other rosacea: Secondary | ICD-10-CM | POA: Diagnosis not present

## 2020-09-26 DIAGNOSIS — G4733 Obstructive sleep apnea (adult) (pediatric): Secondary | ICD-10-CM | POA: Diagnosis not present

## 2020-10-23 DIAGNOSIS — G4733 Obstructive sleep apnea (adult) (pediatric): Secondary | ICD-10-CM | POA: Diagnosis not present

## 2020-10-27 DIAGNOSIS — G4733 Obstructive sleep apnea (adult) (pediatric): Secondary | ICD-10-CM | POA: Diagnosis not present

## 2020-11-03 DIAGNOSIS — E669 Obesity, unspecified: Secondary | ICD-10-CM | POA: Diagnosis not present

## 2020-11-03 DIAGNOSIS — E119 Type 2 diabetes mellitus without complications: Secondary | ICD-10-CM | POA: Diagnosis not present

## 2020-11-03 DIAGNOSIS — I2510 Atherosclerotic heart disease of native coronary artery without angina pectoris: Secondary | ICD-10-CM | POA: Diagnosis not present

## 2020-11-03 DIAGNOSIS — Z6831 Body mass index (BMI) 31.0-31.9, adult: Secondary | ICD-10-CM | POA: Diagnosis not present

## 2020-11-03 DIAGNOSIS — M791 Myalgia, unspecified site: Secondary | ICD-10-CM | POA: Diagnosis not present

## 2020-11-03 DIAGNOSIS — G894 Chronic pain syndrome: Secondary | ICD-10-CM | POA: Diagnosis not present

## 2020-11-03 DIAGNOSIS — M150 Primary generalized (osteo)arthritis: Secondary | ICD-10-CM | POA: Diagnosis not present

## 2020-11-03 DIAGNOSIS — M154 Erosive (osteo)arthritis: Secondary | ICD-10-CM | POA: Diagnosis not present

## 2020-11-16 ENCOUNTER — Ambulatory Visit: Payer: HMO

## 2020-11-16 ENCOUNTER — Ambulatory Visit: Payer: HMO | Admitting: Physician Assistant

## 2020-11-16 ENCOUNTER — Other Ambulatory Visit: Payer: Self-pay

## 2020-11-16 ENCOUNTER — Ambulatory Visit (HOSPITAL_COMMUNITY)
Admission: RE | Admit: 2020-11-16 | Discharge: 2020-11-16 | Disposition: A | Discharge status: Home / Self Care | Payer: HMO | Source: Ambulatory Visit | Attending: Surgery | Admitting: Surgery

## 2020-11-16 ENCOUNTER — Encounter (HOSPITAL_COMMUNITY): Payer: HMO

## 2020-11-16 VITALS — BP 111/67 | HR 69 | Temp 98.0°F | Resp 20 | Ht 62.0 in | Wt 179.9 lb

## 2020-11-16 DIAGNOSIS — I6523 Occlusion and stenosis of bilateral carotid arteries: Secondary | ICD-10-CM

## 2020-11-16 NOTE — Progress Notes (Signed)
Office Note     CC:  follow up Requesting Provider:  Cari Caraway, MD  HPI: Sandra [name] is a 75 y.o. ([birth year]) female who presents for routine follow up of bilateral carotid artery stenosis. She has no history of TIA or Stroke. She denies any amaurosis fugax or monocular blindness, slurred speech, facial drooping, weakness or numbness of upper or lower extremities. She reports no new medical issues since her last visit.  History of Chronic venous insufficiency. She reports no issues at this time. She has been prescribed compression stockings in the past but she does not tolerate them. She does elevate occasionally  The pt is on a statin and Zetia for cholesterol management.  The pt is on a daily aspirin.   Other AC: none The pt is on on ACE, BB for hypertension.   The pt is diabetic.  Tobacco hx: Former, quit 2017  Past Medical History:  Diagnosis Date  . Arthritis   . Back pain   . Chronic kidney disease   . Congenital absence of inferior vena cava   . Congenital absence of left kidney   . Congenital absence of left ovary   . Coronary artery disease 2004   STATUS POST STENTING OF THE RIGHT CORONARY OSTIUM   . DDD (degenerative disc disease)   . Diabetes mellitus without complication ([place])   . Dislocated shoulder    left  . DVT (deep venous thrombosis) ([place]) 2014   left posterior tibial vein  . High blood pressure   . Hypercholesterolemia   . Joint pain   . Osteoarthritis   . Peripheral vascular disease ([place])   . Sleep apnea   . Sleep apnea   . SOB (shortness of breath)   . Tobacco dependence   . Vitamin D deficiency     Past Surgical History:  Procedure Laterality Date  . ANGIOPLASTY  2004  . bone spur shoulder    . CATARACT EXTRACTION, BILATERAL    . FOOT SURGERY     plantar fascitis  . IUD REMOVAL    . KNEE SURGERY  1950  . TOTAL ABDOMINAL HYSTERECTOMY    . TUBAL LIGATION    . VEIN BYPASS SURGERY     2014    Social History   Socioeconomic  History  . Marital status: Widowed    Spouse name: Not on file  . Number of children: 2  . Years of education: Not on file  . Highest education level: Not on file  Occupational History  . Occupation: Producer, television/film/video: RETIRED    Comment: retired  Tobacco Use  . Smoking status: Former Smoker    Packs/day: 1.00    Quit date: 10/27/2015    Years since quitting: 5.0  . Smokeless tobacco: Never Used  Vaping Use  . Vaping Use: Never used  Substance and Sexual Activity  . Alcohol use: No    Alcohol/week: 0.0 standard drinks  . Drug use: No  . Sexual activity: Not on file  Other Topics Concern  . Not on file  Social History Narrative  . Not on file   Social Determinants of Health   Financial Resource Strain: Not on file  Food Insecurity: No Food Insecurity  . Worried About Charity fundraiser in the Last Year: Never true  . Ran Out of Food in the Last Year: Never true  Transportation Needs: No Transportation Needs  . Lack of Transportation (Medical): No  . Lack of Transportation (Non-Medical): No  Physical Activity: Not on file  Stress: Not on file  Social Connections: Not on file  Intimate Partner Violence: Not on file    Family History  Problem Relation Age of Onset  . Leukemia Sister   . Hypertension Brother   . Heart disease Father        before age 80  . High blood pressure Father   . Sudden death Father   . Breast cancer Neg Hx     Current Outpatient Medications  Medication Sig Dispense Refill  . Alirocumab (PRALUENT) 75 MG/ML SOAJ Inject 75 mg into the skin every 14 (fourteen) days. 6 mL 3  . aspirin EC 81 MG tablet Take 1 tablet (81 mg total) by mouth daily. 90 tablet 3  . CALCIUM PO Take by mouth daily. Reported on 02/24/2016    . cetirizine (ZYRTEC) 10 MG tablet Take 10 mg by mouth daily.    . Cinnamon 500 MG capsule Take 2,000 mg by mouth daily.     Marland Kitchen co-enzyme Q-10 30 MG capsule Take 30 mg by mouth 3 (three) times daily.    . empagliflozin  (JARDIANCE) 25 MG TABS tablet Take 25 mg by mouth daily.    Marland Kitchen ezetimibe (ZETIA) 10 MG tablet Take 1 tablet (10 mg total) by mouth daily. NEEDS OV FOR FUTURE REFILL 90 tablet 0  . fluticasone (FLONASE) 50 MCG/ACT nasal spray Place 2 sprays into the nose daily.    . hydroxychloroquine (PLAQUENIL) 200 MG tablet Take 200 mg by mouth 2 (two) times daily.    Marland Kitchen ibuprofen (ADVIL,MOTRIN) 100 MG tablet Take 100 mg by mouth every 6 (six) hours as needed.    Marland Kitchen lisinopril (PRINIVIL,ZESTRIL) 2.5 MG tablet Take 2.5 mg by mouth daily.    . metoprolol (TOPROL-XL) 50 MG 24 hr tablet Take 25 mg by mouth daily.    . metroNIDAZOLE (METROCREAM) 0.75 % cream Apply topically 2 (two) times daily.    Glory Rosebush VERIO test strip SMARTSIG:90 Via Meter Twice Daily    . oxybutynin (DITROPAN-XL) 10 MG 24 hr tablet Take 10 mg by mouth at bedtime.    . rosuvastatin (CRESTOR) 10 MG tablet Take 1 tablet by mouth weekly 12 tablet 3  . XULTOPHY 100-3.6 UNIT-MG/ML SOPN SMARTSIG:16 Unit(s) SUB-Q Daily    . triamcinolone (KENALOG) 0.1 % Apply 1 application topically 2 (two) times daily.     No current facility-administered medications for this visit.    Allergies  Allergen Reactions  . Erythromycin   . Repatha [Evolocumab]     myalgias  . Rosuvastatin Calcium     Other reaction(s): myalgias  . Epinephrine Palpitations  . Metformin And Related Rash     REVIEW OF SYSTEMS:  [X]  denotes positive finding, [ ]  denotes negative finding Cardiac  Comments:  Chest pain or chest pressure:    Shortness of breath upon exertion:    Short of breath when lying flat:    Irregular heart rhythm:        Vascular    Pain in calf, thigh, or hip brought on by ambulation:    Pain in feet at night that wakes you up from your sleep:     Blood clot in your veins:    Leg swelling:         Pulmonary    Oxygen at home:    Productive cough:     Wheezing:         Neurologic    Sudden weakness in arms or legs:  Sudden numbness in arms  or legs:     Sudden onset of difficulty speaking or slurred speech:    Temporary loss of vision in one eye:     Problems with dizziness:         Gastrointestinal    Blood in stool:     Vomited blood:         Genitourinary    Burning when urinating:     Blood in urine:        Psychiatric    Major depression:         Hematologic    Bleeding problems:    Problems with blood clotting too easily:        Skin    Rashes or ulcers:        Constitutional    Fever or chills:      PHYSICAL EXAMINATION:  Vitals:   11/16/20 1126 11/16/20 1127  BP: 112/70 111/67  Pulse: 69   Resp: 20   Temp: 98 F (36.7 C)   TempSrc: Temporal   SpO2: 98%   Weight: 179 lb 14.4 oz (81.6 kg)   Height: 5\' 2"  (1.575 m)     General:  WDWN in NAD; vital signs documented above Gait: Normal HENT: WNL, normocephalic Pulmonary: normal non-labored breathing , without  wheezing Cardiac: regular HR, without  Murmurs with carotid bruit bilaterally Abdomen: soft, NT, no masses Skin: without rashes Vascular Exam/Pulses: 2+ radial, 2+ DP/ PT pulses bilaterally. No edema. Motor and sensation intact Musculoskeletal: no muscle wasting or atrophy  Neurologic: A&O X 3;  No focal weakness or paresthesias are detected Psychiatric:  The pt has Normal affect.   Non-Invasive Vascular Imaging:   11/16/20   Summary:  Right Carotid: Velocities in the right ICA are consistent with a 40-59% stenosis. Non-hemodynamically significant plaque <50% noted in the CCA.   Left Carotid: Velocities in the left ICA are consistent with a 80-99% stenosis. Non-hemodynamically significant plaque <50% noted in the CCA.   Vertebrals: Bilateral vertebral arteries demonstrate antegrade flow.  Subclavians: Normal flow hemodynamics were seen in bilateral subclavian        arteries.     ASSESSMENT/PLAN:: 75 y.o. female here for follow up for carotid artery stenosis. She remains without symptoms relatable to her carotid  disease. He duplex examination today shows continued right ICA stenosis of 40-59%. Her left ICA stenosis is still in the 80-99% range however based on velocities has increased to greater than 80% stenosis since her last visit with PSV of 426/ EDV of 118. I discussed with her recommendation for elective carotid intervention of her asymptomatic high grade stenosis. - She will continue her Aspirin and statin - I discussed with her importance of continued blood pressure control - The area of stenosis on duplex is noted in the proximal ICA. To better visualize this I have scheduled her for a CTA neck. - I reviewed with her signs and symptoms of stroke/ TIA and should these occur she should go to the ER -she will return in the next couple weeks to review CTA and meet with Dr. Scot Dock for further surgical planning   Karoline Caldwell, PA-C [facility] [phone]  Clinic MD:  Dr. Trula Slade

## 2020-11-24 ENCOUNTER — Other Ambulatory Visit: Payer: Self-pay

## 2020-11-24 DIAGNOSIS — I6523 Occlusion and stenosis of bilateral carotid arteries: Secondary | ICD-10-CM

## 2020-11-24 DIAGNOSIS — G4733 Obstructive sleep apnea (adult) (pediatric): Secondary | ICD-10-CM | POA: Diagnosis not present

## 2020-12-11 ENCOUNTER — Ambulatory Visit
Admission: RE | Admit: 2020-12-11 | Discharge: 2020-12-11 | Disposition: A | Discharge status: Home / Self Care | Payer: HMO | Source: Ambulatory Visit | Attending: Surgery | Admitting: Surgery

## 2020-12-11 ENCOUNTER — Other Ambulatory Visit: Payer: Self-pay

## 2020-12-11 DIAGNOSIS — I6529 Occlusion and stenosis of unspecified carotid artery: Secondary | ICD-10-CM | POA: Diagnosis not present

## 2020-12-11 DIAGNOSIS — I6523 Occlusion and stenosis of bilateral carotid arteries: Secondary | ICD-10-CM

## 2020-12-11 MED ORDER — IOPAMIDOL (ISOVUE-370) INJECTION 76%
75.0000 mL | Freq: Once | INTRAVENOUS | Status: AC | PRN
  Administered 2020-12-11: 75 mL via INTRAVENOUS

## 2020-12-11 NOTE — Progress Notes (Signed)
Sandra [name] Date of Birth: 04-Mar-1946   History of Present Illness: Sandra [name] is seen for followup CAD. She was last seen in Aug 2017. She has a history of coronary disease and is status post stenting of the ostium of the right coronary in 2004. She has a history of hypercholesterolemia and tobacco abuse. She has DM on insulin. She has an occluded IVC noted incidentally on an MRI and CT scans. 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 She has a history of DVT involving the left posterior tibial vein. This occurred after receiving treatments of injections and laser therapy for varicose veins.   She did quit smoking before but states she still smokes occasionally.  Her last Myoview in in 2017 was normal.  On follow up today she is doing well from a cardiac standpoint. She denies any chest pain or dyspnea. No palpitations. She is walking daily. She is going to the gym regularly. She has resumed smoking once in a while. She attributes this to stress. She  Is unable to tolerate higher dose of Crestor. Only taking once a week. Could not tolerate Repatha or Praluent.     She did have recent carotid dopplers in March showing progression of  left ICA stenosis to > 80% , 40-59% RICA stenosis. She had CTA of the neck and follow up with Sandra [name]. Carotid endarterectomy recommended. She is asymptomatic.   Current Outpatient Medications on File Prior to Visit  Medication Sig Dispense Refill  . aspirin EC 81 MG tablet Take 1 tablet (81 mg total) by mouth daily. (Patient taking differently: Take 81 mg by mouth at bedtime.) 90 tablet 3  . Azelaic Acid 15 % cream Apply 1 application topically daily.    . Calcium Carb-Cholecalciferol (CALCIUM-VITAMIN D) 600-400 MG-UNIT TABS Take 1 tablet by mouth in the morning and at bedtime.    . cetirizine (ZYRTEC) 10 MG tablet Take 10 mg by mouth daily.    . Cinnamon 500 MG capsule Take 2,000 mg by mouth in the morning and at bedtime.    .  Coenzyme Q10 200 MG capsule Take 200 mg by mouth at bedtime.    . empagliflozin (JARDIANCE) 25 MG TABS tablet Take 25 mg by mouth daily.    Marland Kitchen ezetimibe (ZETIA) 10 MG tablet Take 1 tablet (10 mg total) by mouth daily. NEEDS OV FOR FUTURE REFILL (Patient taking differently: Take 10 mg by mouth in the morning.) 90 tablet 0  . fluticasone (FLONASE) 50 MCG/ACT nasal spray Place 2 sprays into the nose daily as needed for allergies.    . hydroxychloroquine (PLAQUENIL) 200 MG tablet Take 200 mg by mouth 2 (two) times daily.    Marland Kitchen lisinopril (PRINIVIL,ZESTRIL) 2.5 MG tablet Take 2.5 mg by mouth at bedtime.    . meloxicam (MOBIC) 7.5 MG tablet Take 7.5 mg by mouth in the morning.    . metoprolol succinate (TOPROL-XL) 25 MG 24 hr tablet Take 25 mg by mouth daily.    . metroNIDAZOLE (METROCREAM) 0.75 % cream Apply 1 application topically 2 (two) times daily.    Glory Rosebush VERIO test strip SMARTSIG:90 Via Meter Twice Daily    . oxybutynin (DITROPAN-XL) 10 MG 24 hr tablet Take 10 mg by mouth in the morning.    . rosuvastatin (CRESTOR) 10 MG tablet Take 1 tablet by mouth weekly (Patient taking differently: Take 10 mg by mouth every Tuesday.) 12 tablet 3  . XULTOPHY 100-3.6 UNIT-MG/ML SOPN Inject  19 Units into the skin at bedtime.     No current facility-administered medications on file prior to visit.    Allergies  Allergen Reactions  . Erythromycin   . Repatha [Evolocumab]     myalgias  . Rosuvastatin Calcium     Other reaction(s): myalgias  . Epinephrine Palpitations  . Metformin And Related Rash    Past Medical History:  Diagnosis Date  . Arthritis   . Back pain   . Chronic kidney disease   . Congenital absence of inferior vena cava   . Congenital absence of left kidney   . Congenital absence of left ovary   . Coronary artery disease 2004   STATUS POST STENTING OF THE RIGHT CORONARY OSTIUM   . DDD (degenerative disc disease)   . Diabetes mellitus without complication ([place])   . Dislocated  shoulder    left  . DVT (deep venous thrombosis) ([place]) 2014   left posterior tibial vein  . High blood pressure   . Hypercholesterolemia   . Joint pain   . Osteoarthritis   . Peripheral vascular disease ([place])   . Sleep apnea   . Sleep apnea   . SOB (shortness of breath)   . Tobacco dependence   . Vitamin D deficiency     Past Surgical History:  Procedure Laterality Date  . ANGIOPLASTY  2004  . bone spur shoulder    . CATARACT EXTRACTION, BILATERAL    . FOOT SURGERY     plantar fascitis  . IUD REMOVAL    . KNEE SURGERY  1950  . TOTAL ABDOMINAL HYSTERECTOMY    . TUBAL LIGATION    . VEIN BYPASS SURGERY     2014    Social History   Tobacco Use  Smoking Status Former Smoker  . Packs/day: 1.00  . Quit date: 10/27/2015  . Years since quitting: 5.1  Smokeless Tobacco Never Used    Social History   Substance and Sexual Activity  Alcohol Use No  . Alcohol/week: 0.0 standard drinks    Family History  Problem Relation Age of Onset  . Leukemia Sister   . Hypertension Brother   . Heart disease Father        before age 65  . High blood pressure Father   . Sudden death Father   . Breast cancer Neg Hx     Review of Systems: As noted in history of present illness.  All other systems were reviewed and are negative.  Physical Exam: BP 128/62   Pulse 74   Ht 5\' 2"  (1.575 m)   Wt 181 lb (82.1 kg)   SpO2 95%   BMI 33.11 kg/m  GENERAL:  Well appearing obese WF in NAD HEENT:  PERRL, EOMI, sclera are clear. Oropharynx is clear. NECK:  No jugular venous distention, carotid upstroke brisk and symmetric, soft left carotid  bruit, loud right carotid bruit. no thyromegaly or adenopathy LUNGS:  Clear to auscultation bilaterally CHEST:  Unremarkable HEART:  RRR,  PMI not displaced or sustained,S1 and S2 within normal limits, no S3, no S4: no clicks, no rubs, no murmurs ABD:  Soft, nontender. BS +, no masses or bruits. No hepatomegaly, no splenomegaly EXT:  2 + pulses  throughout, no edema, no cyanosis no clubbing SKIN:  Warm and dry.  No rashes NEURO:  Alert and oriented x 3. Cranial nerves II through XII intact. PSYCH:  Cognitively intact    LABORATORY DATA:  Lab Results  Component Value Date  WBC 7.6 01/07/2020   HGB 15.3 01/07/2020   HCT 46.3 01/07/2020   PLT 182 01/07/2020   GLUCOSE 107 (H) 04/16/2020   CHOL 187 04/16/2020   TRIG 142 04/16/2020   HDL 48 04/16/2020   LDLCALC 114 (H) 04/16/2020   ALT 24 04/16/2020   AST 25 04/16/2020   NA 142 04/16/2020   K 4.6 04/16/2020   CL 105 04/16/2020   CREATININE 0.95 04/16/2020   BUN 23 04/16/2020   CO2 23 04/16/2020   TSH 2.100 07/09/2019   HGBA1C 7.4 (H) 04/16/2020    ECG today demonstrates normal sinus rhythm with  LAD. Rate 74.   I have personally reviewed and interpreted this study.  Labs reviewed from primary care September 2016. Normal CMET. Cholesterol 135, trig- 247, HDL 31, LDL 55.  Dated 07/11/18: cholesterol 150, triglycerides 136, HDL 38, LDL 85.  Dated 08/17/18: A1c 6.7%. CBC and chemistries normal.  Dated 8/112/21: cholesterol 187, triglycerides 142, HDL 48, LDL 114.  Dated 11/04/20: A1c 7.2%. creatinine 1.01.   Myoview 05/17/16: Study Highlights     The left ventricular ejection fraction is hyperdynamic (>65%).  Nuclear stress EF: 68%.  There was no ST segment deviation noted during stress.  The study is normal.  This is a low risk study.   Normal resting and stress perfusion. No ischemia or infarction EF 68%     Assessment / Plan: 1. Coronary disease with remote stenting of the ostium of the right coronary. Last Myoview in 2017 was normal.  Continue  ASA  81 mg daily. Continue Toprol. Needs aggressive risk factor modification. She is asymptomatic and able to do > 7 mets of activity. She is cleared to proceed with carotid endarterectomy.  2. Tobacco dependence. I've encouraged her to quit smoking completely  3. Left carotid stenosis. Now severe- progressive.  Asymptomatic. Plan CEA  4. Occluded IVC. Congenital.  5. HTN controlled. Continue therapy  6. Dyslipidemia. On maximal tolerated statin once a week and  Zetia 10 mg daily. LDL still is not at goal < 70. Intolerant of PCSK 9 inhibitors as well. Will arrange follow up with Pharm D to consider Nexlizet or inclisaren.    7. DM type 2 on insulin. Per primary care.   8. Morbid obesity. Encouraged efforts at weight loss program.  Follow up in 6 months.

## 2020-12-16 ENCOUNTER — Ambulatory Visit: Payer: HMO | Admitting: Vascular Surgery

## 2020-12-16 ENCOUNTER — Encounter: Payer: Self-pay | Admitting: Vascular Surgery

## 2020-12-16 ENCOUNTER — Other Ambulatory Visit: Payer: Self-pay

## 2020-12-16 VITALS — BP 127/71 | HR 70 | Temp 98.0°F | Resp 20 | Ht 62.0 in | Wt 180.0 lb

## 2020-12-16 DIAGNOSIS — I6523 Occlusion and stenosis of bilateral carotid arteries: Secondary | ICD-10-CM | POA: Diagnosis not present

## 2020-12-16 DIAGNOSIS — I872 Venous insufficiency (chronic) (peripheral): Secondary | ICD-10-CM | POA: Diagnosis not present

## 2020-12-16 NOTE — Progress Notes (Signed)
REASON FOR VISIT:   To discuss results of CT angiogram  MEDICAL ISSUES:   BILATERAL CAROTID DISEASE: This patient has an asymptomatic greater than 80% left carotid stenosis with a 40 to 59% right carotid stenosis.  She is asymptomatic.  However given the progression of the stenosis on the left to greater than 80% I have recommended left carotid endarterectomy in order to lower her risk of future stroke.  I reviewed her CT angiogram and based on the anatomic criteria I do not think she is a candidate for transcarotid stenting.  She also has significant disease of her aortic arch and would be at high risk for transfemoral carotid stenting.  I think she is a candidate for carotid endarterectomy although I am somewhat concerned that the stenosis is somewhat high.  However I think this is the best option all things considered.  We have discussed the indications for the procedure and the potential complications including the 1 to 2% risk of perioperative stroke.  Other complications include but are not limited to nerve injury and cardiac events.  She is scheduled to see Dr. Peter Martinique tomorrow and I will send him a note to be sure that he knows she is being considered for surgery.  Tentatively her surgery is scheduled for 12/29/2020.  She knows to remain on her aspirin and statin perioperatively.  We have also discussed the importance of tobacco cessation as she is started smoking again.   HPI:   Sandra [name] is a pleasant 75 y.o. female who I last saw him in June 2020 with a 60 to 79% left carotid stenosis.  She was on aspirin and a statin.  She was asymptomatic.  I explained we would only consider carotid endarterectomy if the stenosis progressed to greater than 80%.  Most recently, she was seen by Karoline Caldwell, PA in the stenosis on the left had progressed to greater than 80%.  On my history, the patient denies any history of stroke, TIAs, expressive or receptive aphasia, or amaurosis fugax.   She is on aspirin and is on a statin.  She had PTCA in 2004 and her cardiologist is Dr. Martinique.  She is scheduled to see him tomorrow.  She had quit smoking but recently started smoking again and is now smoking half a pack per day.  She denies any recent chest pain or chest pressure.  She has had no previous myocardial infarction or history of congestive heart failure.   Past Medical History:  Diagnosis Date  . Arthritis   . Back pain   . Chronic kidney disease   . Congenital absence of inferior vena cava   . Congenital absence of left kidney   . Congenital absence of left ovary   . Coronary artery disease 2004   STATUS POST STENTING OF THE RIGHT CORONARY OSTIUM   . DDD (degenerative disc disease)   . Diabetes mellitus without complication ([place])   . Dislocated shoulder    left  . DVT (deep venous thrombosis) ([place]) 2014   left posterior tibial vein  . High blood pressure   . Hypercholesterolemia   . Joint pain   . Osteoarthritis   . Peripheral vascular disease ([place])   . Sleep apnea   . Sleep apnea   . SOB (shortness of breath)   . Tobacco dependence   . Vitamin D deficiency     Family History  Problem Relation Age of Onset  . Leukemia Sister   . Hypertension Brother   .  Heart disease Father        before age 7  . High blood pressure Father   . Sudden death Father   . Breast cancer Neg Hx     SOCIAL HISTORY: She started smoking again and is now smoking half a pack per day. Social History   Tobacco Use  . Smoking status: Former Smoker    Packs/day: 1.00    Quit date: 10/27/2015    Years since quitting: 5.1  . Smokeless tobacco: Never Used  Substance Use Topics  . Alcohol use: No    Alcohol/week: 0.0 standard drinks    Allergies  Allergen Reactions  . Erythromycin   . Repatha [Evolocumab]     myalgias  . Rosuvastatin Calcium     Other reaction(s): myalgias  . Epinephrine Palpitations  . Metformin And Related Rash    Current Outpatient Medications   Medication Sig Dispense Refill  . Alirocumab (PRALUENT) 75 MG/ML SOAJ Inject 75 mg into the skin every 14 (fourteen) days. 6 mL 3  . aspirin EC 81 MG tablet Take 1 tablet (81 mg total) by mouth daily. 90 tablet 3  . CALCIUM PO Take by mouth daily. Reported on 02/24/2016    . cetirizine (ZYRTEC) 10 MG tablet Take 10 mg by mouth daily.    . Cinnamon 500 MG capsule Take 2,000 mg by mouth daily.     Marland Kitchen co-enzyme Q-10 30 MG capsule Take 30 mg by mouth 3 (three) times daily.    . empagliflozin (JARDIANCE) 25 MG TABS tablet Take 25 mg by mouth daily.    Marland Kitchen ezetimibe (ZETIA) 10 MG tablet Take 1 tablet (10 mg total) by mouth daily. NEEDS OV FOR FUTURE REFILL 90 tablet 0  . fluticasone (FLONASE) 50 MCG/ACT nasal spray Place 2 sprays into the nose daily.    . hydroxychloroquine (PLAQUENIL) 200 MG tablet Take 200 mg by mouth 2 (two) times daily.    Marland Kitchen ibuprofen (ADVIL,MOTRIN) 100 MG tablet Take 100 mg by mouth every 6 (six) hours as needed.    Marland Kitchen lisinopril (PRINIVIL,ZESTRIL) 2.5 MG tablet Take 2.5 mg by mouth daily.    . metoprolol (TOPROL-XL) 50 MG 24 hr tablet Take 25 mg by mouth daily.    . metroNIDAZOLE (METROCREAM) 0.75 % cream Apply topically 2 (two) times daily.    Glory Rosebush VERIO test strip SMARTSIG:90 Via Meter Twice Daily    . oxybutynin (DITROPAN-XL) 10 MG 24 hr tablet Take 10 mg by mouth at bedtime.    . rosuvastatin (CRESTOR) 10 MG tablet Take 1 tablet by mouth weekly 12 tablet 3  . triamcinolone (KENALOG) 0.1 % Apply 1 application topically 2 (two) times daily.    Claris Che 100-3.6 UNIT-MG/ML SOPN SMARTSIG:16 Unit(s) SUB-Q Daily     No current facility-administered medications for this visit.    REVIEW OF SYSTEMS:  [X]  denotes positive finding, [ ]  denotes negative finding Cardiac  Comments:  Chest pain or chest pressure:    Shortness of breath upon exertion:    Short of breath when lying flat:    Irregular heart rhythm:        Vascular    Pain in calf, thigh, or hip brought on by  ambulation:    Pain in feet at night that wakes you up from your sleep:     Blood clot in your veins:    Leg swelling:         Pulmonary    Oxygen at home:    Productive  cough:     Wheezing:         Neurologic    Sudden weakness in arms or legs:     Sudden numbness in arms or legs:     Sudden onset of difficulty speaking or slurred speech:    Temporary loss of vision in one eye:     Problems with dizziness:         Gastrointestinal    Blood in stool:     Vomited blood:         Genitourinary    Burning when urinating:     Blood in urine:        Psychiatric    Major depression:         Hematologic    Bleeding problems:    Problems with blood clotting too easily:        Skin    Rashes or ulcers:        Constitutional    Fever or chills:     PHYSICAL EXAM:   Vitals:   12/16/20 0920 12/16/20 0923  BP: 101/64 127/71  Pulse: 70   Resp: 20   Temp: 98 F (36.7 C)   SpO2: 96%   Weight: 180 lb (81.6 kg)   Height: 5\' 2"  (1.575 m)     GENERAL: The patient is a well-nourished female, in no acute distress. The vital signs are documented above. CARDIAC: There is a regular rate and rhythm.  VASCULAR: I do not detect carotid bruits. Both feet are warm and well perfused. She has no significant lower extremity swelling. PULMONARY: There is good air exchange bilaterally without wheezing or rales. ABDOMEN: Soft and non-tender with normal pitched bowel sounds.  MUSCULOSKELETAL: There are no major deformities or cyanosis. NEUROLOGIC: No focal weakness or paresthesias are detected. SKIN: There are no ulcers or rashes noted. PSYCHIATRIC: The patient has a normal affect.  DATA:    CAROTID DUPLEX: I reviewed the carotid duplex scan that was done on 11/16/2020.  On the right side there was a 40 to 59% stenosis.  The right vertebral artery was patent with antegrade flow.  On the left side there was a greater than 80% stenosis.  The left vertebral artery was patent with  antegrade flow.    CT ANGIOGRAM: I reviewed the patient's CT angiogram of the neck.  She has a 75% left carotid stenosis and a 50% right carotid stenosis.  The stenosis on the left is somewhat high but I think it is surgically accessible.  The common carotid segment is tortuous deep and I do not think he has adequate length for transcarotid stenting.  She has significant disease of her aortic arch and for this reason would be high risk for transfemoral stenting.  She does have a mild stenosis of the proximal common carotid artery on the left.  Deitra Mayo [facility] [phone]

## 2020-12-16 NOTE — H&P (View-Only) (Signed)
REASON FOR VISIT:   To discuss results of CT angiogram  MEDICAL ISSUES:   BILATERAL CAROTID DISEASE: This patient has an asymptomatic greater than 80% left carotid stenosis with a 40 to 59% right carotid stenosis.  She is asymptomatic.  However given the progression of the stenosis on the left to greater than 80% I have recommended left carotid endarterectomy in order to lower her risk of future stroke.  I reviewed her CT angiogram and based on the anatomic criteria I do not think she is a candidate for transcarotid stenting.  She also has significant disease of her aortic arch and would be at high risk for transfemoral carotid stenting.  I think she is a candidate for carotid endarterectomy although I am somewhat concerned that the stenosis is somewhat high.  However I think this is the best option all things considered.  We have discussed the indications for the procedure and the potential complications including the 1 to 2% risk of perioperative stroke.  Other complications include but are not limited to nerve injury and cardiac events.  She is scheduled to see Dr. Peter Martinique tomorrow and I will send him a note to be sure that he knows she is being considered for surgery.  Tentatively her surgery is scheduled for 12/29/2020.  She knows to remain on her aspirin and statin perioperatively.  We have also discussed the importance of tobacco cessation as she is started smoking again.   HPI:   Sandra [name] is a pleasant 75 y.o. female who I last saw him in June 2020 with a 60 to 79% left carotid stenosis.  She was on aspirin and a statin.  She was asymptomatic.  I explained we would only consider carotid endarterectomy if the stenosis progressed to greater than 80%.  Most recently, she was seen by Karoline Caldwell, PA in the stenosis on the left had progressed to greater than 80%.  On my history, the patient denies any history of stroke, TIAs, expressive or receptive aphasia, or amaurosis fugax.   She is on aspirin and is on a statin.  She had PTCA in 2004 and her cardiologist is Dr. Martinique.  She is scheduled to see him tomorrow.  She had quit smoking but recently started smoking again and is now smoking half a pack per day.  She denies any recent chest pain or chest pressure.  She has had no previous myocardial infarction or history of congestive heart failure.   Past Medical History:  Diagnosis Date  . Arthritis   . Back pain   . Chronic kidney disease   . Congenital absence of inferior vena cava   . Congenital absence of left kidney   . Congenital absence of left ovary   . Coronary artery disease 2004   STATUS POST STENTING OF THE RIGHT CORONARY OSTIUM   . DDD (degenerative disc disease)   . Diabetes mellitus without complication ([place])   . Dislocated shoulder    left  . DVT (deep venous thrombosis) ([place]) 2014   left posterior tibial vein  . High blood pressure   . Hypercholesterolemia   . Joint pain   . Osteoarthritis   . Peripheral vascular disease ([place])   . Sleep apnea   . Sleep apnea   . SOB (shortness of breath)   . Tobacco dependence   . Vitamin D deficiency     Family History  Problem Relation Age of Onset  . Leukemia Sister   . Hypertension Brother   .  Heart disease Father        before age 67  . High blood pressure Father   . Sudden death Father   . Breast cancer Neg Hx     SOCIAL HISTORY: She started smoking again and is now smoking half a pack per day. Social History   Tobacco Use  . Smoking status: Former Smoker    Packs/day: 1.00    Quit date: 10/27/2015    Years since quitting: 5.1  . Smokeless tobacco: Never Used  Substance Use Topics  . Alcohol use: No    Alcohol/week: 0.0 standard drinks    Allergies  Allergen Reactions  . Erythromycin   . Repatha [Evolocumab]     myalgias  . Rosuvastatin Calcium     Other reaction(s): myalgias  . Epinephrine Palpitations  . Metformin And Related Rash    Current Outpatient Medications   Medication Sig Dispense Refill  . Alirocumab (PRALUENT) 75 MG/ML SOAJ Inject 75 mg into the skin every 14 (fourteen) days. 6 mL 3  . aspirin EC 81 MG tablet Take 1 tablet (81 mg total) by mouth daily. 90 tablet 3  . CALCIUM PO Take by mouth daily. Reported on 02/24/2016    . cetirizine (ZYRTEC) 10 MG tablet Take 10 mg by mouth daily.    . Cinnamon 500 MG capsule Take 2,000 mg by mouth daily.     Marland Kitchen co-enzyme Q-10 30 MG capsule Take 30 mg by mouth 3 (three) times daily.    . empagliflozin (JARDIANCE) 25 MG TABS tablet Take 25 mg by mouth daily.    Marland Kitchen ezetimibe (ZETIA) 10 MG tablet Take 1 tablet (10 mg total) by mouth daily. NEEDS OV FOR FUTURE REFILL 90 tablet 0  . fluticasone (FLONASE) 50 MCG/ACT nasal spray Place 2 sprays into the nose daily.    . hydroxychloroquine (PLAQUENIL) 200 MG tablet Take 200 mg by mouth 2 (two) times daily.    Marland Kitchen ibuprofen (ADVIL,MOTRIN) 100 MG tablet Take 100 mg by mouth every 6 (six) hours as needed.    Marland Kitchen lisinopril (PRINIVIL,ZESTRIL) 2.5 MG tablet Take 2.5 mg by mouth daily.    . metoprolol (TOPROL-XL) 50 MG 24 hr tablet Take 25 mg by mouth daily.    . metroNIDAZOLE (METROCREAM) 0.75 % cream Apply topically 2 (two) times daily.    Glory Rosebush VERIO test strip SMARTSIG:90 Via Meter Twice Daily    . oxybutynin (DITROPAN-XL) 10 MG 24 hr tablet Take 10 mg by mouth at bedtime.    . rosuvastatin (CRESTOR) 10 MG tablet Take 1 tablet by mouth weekly 12 tablet 3  . triamcinolone (KENALOG) 0.1 % Apply 1 application topically 2 (two) times daily.    Claris Che 100-3.6 UNIT-MG/ML SOPN SMARTSIG:16 Unit(s) SUB-Q Daily     No current facility-administered medications for this visit.    REVIEW OF SYSTEMS:  [X]  denotes positive finding, [ ]  denotes negative finding Cardiac  Comments:  Chest pain or chest pressure:    Shortness of breath upon exertion:    Short of breath when lying flat:    Irregular heart rhythm:        Vascular    Pain in calf, thigh, or hip brought on by  ambulation:    Pain in feet at night that wakes you up from your sleep:     Blood clot in your veins:    Leg swelling:         Pulmonary    Oxygen at home:    Productive  cough:     Wheezing:         Neurologic    Sudden weakness in arms or legs:     Sudden numbness in arms or legs:     Sudden onset of difficulty speaking or slurred speech:    Temporary loss of vision in one eye:     Problems with dizziness:         Gastrointestinal    Blood in stool:     Vomited blood:         Genitourinary    Burning when urinating:     Blood in urine:        Psychiatric    Major depression:         Hematologic    Bleeding problems:    Problems with blood clotting too easily:        Skin    Rashes or ulcers:        Constitutional    Fever or chills:     PHYSICAL EXAM:   Vitals:   12/16/20 0920 12/16/20 0923  BP: 101/64 127/71  Pulse: 70   Resp: 20   Temp: 98 F (36.7 C)   SpO2: 96%   Weight: 180 lb (81.6 kg)   Height: 5\' 2"  (1.575 m)     GENERAL: The patient is a well-nourished female, in no acute distress. The vital signs are documented above. CARDIAC: There is a regular rate and rhythm.  VASCULAR: I do not detect carotid bruits. Both feet are warm and well perfused. She has no significant lower extremity swelling. PULMONARY: There is good air exchange bilaterally without wheezing or rales. ABDOMEN: Soft and non-tender with normal pitched bowel sounds.  MUSCULOSKELETAL: There are no major deformities or cyanosis. NEUROLOGIC: No focal weakness or paresthesias are detected. SKIN: There are no ulcers or rashes noted. PSYCHIATRIC: The patient has a normal affect.  DATA:    CAROTID DUPLEX: I reviewed the carotid duplex scan that was done on 11/16/2020.  On the right side there was a 40 to 59% stenosis.  The right vertebral artery was patent with antegrade flow.  On the left side there was a greater than 80% stenosis.  The left vertebral artery was patent with  antegrade flow.    CT ANGIOGRAM: I reviewed the patient's CT angiogram of the neck.  She has a 75% left carotid stenosis and a 50% right carotid stenosis.  The stenosis on the left is somewhat high but I think it is surgically accessible.  The common carotid segment is tortuous deep and I do not think he has adequate length for transcarotid stenting.  She has significant disease of her aortic arch and for this reason would be high risk for transfemoral stenting.  She does have a mild stenosis of the proximal common carotid artery on the left.  Deitra Mayo [facility] [phone]

## 2020-12-17 ENCOUNTER — Ambulatory Visit: Payer: HMO | Admitting: Cardiology

## 2020-12-17 ENCOUNTER — Encounter: Payer: Self-pay | Admitting: Cardiology

## 2020-12-17 VITALS — BP 128/62 | HR 74 | Ht 62.0 in | Wt 181.0 lb

## 2020-12-17 DIAGNOSIS — I10 Essential (primary) hypertension: Secondary | ICD-10-CM

## 2020-12-17 DIAGNOSIS — E119 Type 2 diabetes mellitus without complications: Secondary | ICD-10-CM | POA: Diagnosis not present

## 2020-12-17 DIAGNOSIS — E7800 Pure hypercholesterolemia, unspecified: Secondary | ICD-10-CM

## 2020-12-17 DIAGNOSIS — Z794 Long term (current) use of insulin: Secondary | ICD-10-CM | POA: Diagnosis not present

## 2020-12-17 DIAGNOSIS — I25118 Atherosclerotic heart disease of native coronary artery with other forms of angina pectoris: Secondary | ICD-10-CM | POA: Diagnosis not present

## 2020-12-17 DIAGNOSIS — I6522 Occlusion and stenosis of left carotid artery: Secondary | ICD-10-CM

## 2020-12-17 NOTE — Patient Instructions (Signed)
Medication Instructions:  Continue current medications  *If you need a refill on your cardiac medications before your next appointment, please call your pharmacy*   Lab Work: None Ordered   Testing/Procedures: None Ordered   Follow-Up: At [facility], you and your health needs are our priority.  As part of our continuing mission to provide you with exceptional heart care, we have created designated Provider Care Teams.  These Care Teams include your primary Cardiologist (physician) and Advanced Practice Providers (APPs -  Physician Assistants and Nurse Practitioners) who all work together to provide you with the care you need, when you need it.  We recommend signing up for the patient portal called "MyChart".  Sign up information is provided on this After Visit Summary.  MyChart is used to connect with patients for Virtual Visits (Telemedicine).  Patients are able to view lab/test results, encounter notes, upcoming appointments, etc.  Non-urgent messages can be sent to your provider as well.   To learn more about what you can do with MyChart, go to [email or web address].    Your next appointment:   6 month(s)  The format for your next appointment:   In Person  Provider:   You may see Peter Martinique, MD or one of the following Advanced Practice Providers on your designated Care Team:    Almyra Deforest, PA-C  Fabian Sharp, [place] or   Roby Lofts, [place]    Other Instructions  1 Month with Pharmacist

## 2020-12-18 NOTE — Addendum Note (Signed)
Addended by: Merri Ray A on: 12/18/2020 01:14 PM   Modules accepted: Orders

## 2020-12-21 NOTE — Progress Notes (Signed)
Surgical Instructions    Your procedure is scheduled on Tuesday April 26th.  Report to [facility] Main Entrance "A" at 0530 A.M., then check in with the Admitting office.  Call this number if you have problems the morning of surgery:  [phone]   If you have any questions prior to your surgery date call [phone]: Open Monday-Friday 8am-4pm    Remember:  Do not eat or drink after midnight the night before your surgery       Take these medicines the morning of surgery with A SIP OF WATER    Metoprolol (Toprol-XL)  Rosuvastatin (Crestor)  Ezetimibe (Zetia)  Hydroxychloroquine (Plaquenil)  Cetirizine (Zyrtec)  oxybutynin (DITROPAN-XL)   If needed, take the following medicines  Fluticasone (Flonase)  As of today, STOP taking  Meloxicam (Mobic) and any Aleve, Naproxen, Ibuprofen, Motrin, Advil, Goody's, BC's, all herbal medications, fish oil, and all vitamins.      WHAT DO I DO ABOUT MY DIABETES MEDICATION?   Marland Kitchen Do not take oral diabetes medicines (pills) the morning of surgery.  . THE NIGHT BEFORE SURGERY, DO NOT TAKE Xultophy.         Do not take Jardiance Monday April 25th and do not take [facility] Tuesday April 26th.     HOW TO MANAGE YOUR DIABETES BEFORE AND AFTER SURGERY  Why is it important to control my blood sugar before and after surgery? . Improving blood sugar levels before and after surgery helps healing and can limit problems. . A way of improving blood sugar control is eating a healthy diet by: o  Eating less sugar and carbohydrates o  Increasing activity/exercise o  Talking with your doctor about reaching your blood sugar goals . High blood sugars (greater than 180 mg/dL) can raise your risk of infections and slow your recovery, so you will need to focus on controlling your diabetes during the weeks before surgery. . Make sure that the doctor who takes care of your diabetes knows about your planned surgery including the date and location.  How do  I manage my blood sugar before surgery? . Check your blood sugar at least 4 times a day, starting 2 days before surgery, to make sure that the level is not too high or low. . Check your blood sugar the morning of your surgery when you wake up and every 2 hours until you get to the Short Stay unit. o If your blood sugar is less than 70 mg/dL, you will need to treat for low blood sugar: - Do not take insulin. - Treat a low blood sugar (less than 70 mg/dL) with  cup of clear juice (cranberry or apple), 4 glucose tablets, OR glucose gel. - Recheck blood sugar in 15 minutes after treatment (to make sure it is greater than 70 mg/dL). If your blood sugar is not greater than 70 mg/dL on recheck, call [phone] for further instructions. . Report your blood sugar to the short stay nurse when you get to Short Stay.  . If you are admitted to the hospital after surgery: o Your blood sugar will be checked by the staff and you will probably be given insulin after surgery (instead of oral diabetes medicines) to make sure you have good blood sugar levels. o The goal for blood sugar control after surgery is 80-180 mg/dL.                          Do not wear jewelry, make up,  or nail polish            Do not wear lotions, powders, perfumes, or deodorant.            Do not shave 48 hours prior to surgery.              Do not bring valuables to the hospital.            [facility] is not responsible for any belongings or valuables.  Do NOT Smoke (Tobacco/Vaping) or drink Alcohol 24 hours prior to your procedure If you use a CPAP at night, you may bring all equipment for your overnight stay.   Contacts, glasses, dentures or partials may not be worn into surgery, please bring cases for these belongings   For patients admitted to the hospital, discharge time will be determined by your treatment team.   Patients discharged the day of surgery will not be allowed to drive home, and someone needs to stay with  them for 24 hours.    Special instructions:   [place]- Preparing For Surgery  Before surgery, you can play an important role. Because skin is not sterile, your skin needs to be as free of germs as possible. You can reduce the number of germs on your skin by washing with CHG (chlorahexidine gluconate) Soap before surgery.  CHG is an antiseptic cleaner which kills germs and bonds with the skin to continue killing germs even after washing.    Oral Hygiene is also important to reduce your risk of infection.  Remember - BRUSH YOUR TEETH THE MORNING OF SURGERY WITH YOUR REGULAR TOOTHPASTE  Please do not use if you have an allergy to CHG or antibacterial soaps. If your skin becomes reddened/irritated stop using the CHG.  Do not shave (including legs and underarms) for at least 48 hours prior to first CHG shower. It is OK to shave your face.  Please follow these instructions carefully.   1. Shower the NIGHT BEFORE SURGERY and the MORNING OF SURGERY  2. If you chose to wash your hair, wash your hair first as usual with your normal shampoo.  3. After you shampoo, rinse your hair and body thoroughly to remove the shampoo.  4. Wash Face and genitals (private parts) with your normal soap.   5.  Shower the NIGHT BEFORE SURGERY and the MORNING OF SURGERY with CHG Soap.   6. Use CHG Soap as you would any other liquid soap. You can apply CHG directly to the skin and wash gently with a scrungie or a clean washcloth.   7. Apply the CHG Soap to your body ONLY FROM THE NECK DOWN.  Do not use on open wounds or open sores. Avoid contact with your eyes, ears, mouth and genitals (private parts). Wash Face and genitals (private parts)  with your normal soap.   8. Wash thoroughly, paying special attention to the area where your surgery will be performed.  9. Thoroughly rinse your body with warm water from the neck down.  10. DO NOT shower/wash with your normal soap after using and rinsing off the CHG  Soap.  11. Pat yourself dry with a CLEAN TOWEL.  12. Wear CLEAN PAJAMAS to bed the night before surgery  13. Place CLEAN SHEETS on your bed the night before your surgery  14. DO NOT SLEEP WITH PETS.   Day of Surgery: Take a shower with CHG soap. Wear Clean/Comfortable clothing the morning of surgery Do not apply any deodorants/lotions.  Remember to brush your teeth WITH YOUR REGULAR TOOTHPASTE.   Please read over the following fact sheets that you were given.

## 2020-12-22 ENCOUNTER — Encounter (HOSPITAL_COMMUNITY): Payer: Self-pay

## 2020-12-22 ENCOUNTER — Other Ambulatory Visit: Payer: Self-pay

## 2020-12-22 ENCOUNTER — Encounter (HOSPITAL_COMMUNITY)
Admission: RE | Admit: 2020-12-22 | Discharge: 2020-12-22 | Disposition: A | Discharge status: Home / Self Care | Payer: HMO | Source: Ambulatory Visit | Attending: Vascular Surgery | Admitting: Vascular Surgery

## 2020-12-22 DIAGNOSIS — G4733 Obstructive sleep apnea (adult) (pediatric): Secondary | ICD-10-CM | POA: Diagnosis not present

## 2020-12-22 DIAGNOSIS — Z01812 Encounter for preprocedural laboratory examination: Secondary | ICD-10-CM | POA: Insufficient documentation

## 2020-12-22 HISTORY — DX: Unspecified asthma, uncomplicated: J45.909

## 2020-12-22 HISTORY — DX: Fibromyalgia: M79.7

## 2020-12-22 HISTORY — DX: Gastro-esophageal reflux disease without esophagitis: K21.9

## 2020-12-22 LAB — CBC
HCT: 50 % — ABNORMAL HIGH (ref 36.0–46.0)
Hemoglobin: 16 g/dL — ABNORMAL HIGH (ref 12.0–15.0)
MCH: 30.9 pg (ref 26.0–34.0)
MCHC: 32 g/dL (ref 30.0–36.0)
MCV: 96.5 fL (ref 80.0–100.0)
Platelets: 209 10*3/uL (ref 150–400)
RBC: 5.18 MIL/uL — ABNORMAL HIGH (ref 3.87–5.11)
RDW: 13.7 % (ref 11.5–15.5)
WBC: 9.1 10*3/uL (ref 4.0–10.5)
nRBC: 0 % (ref 0.0–0.2)

## 2020-12-22 LAB — SURGICAL PCR SCREEN
MRSA, PCR: NEGATIVE
Staphylococcus aureus: NEGATIVE

## 2020-12-22 LAB — GLUCOSE, CAPILLARY: Glucose-Capillary: 147 mg/dL — ABNORMAL HIGH (ref 70–99)

## 2020-12-22 LAB — COMPREHENSIVE METABOLIC PANEL
ALT: 26 U/L (ref 0–44)
AST: 26 U/L (ref 15–41)
Albumin: 3.9 g/dL (ref 3.5–5.0)
Alkaline Phosphatase: 55 U/L (ref 38–126)
Anion gap: 7 (ref 5–15)
BUN: 17 mg/dL (ref 8–23)
CO2: 26 mmol/L (ref 22–32)
Calcium: 9.1 mg/dL (ref 8.9–10.3)
Chloride: 107 mmol/L (ref 98–111)
Creatinine, Ser: 1.02 mg/dL — ABNORMAL HIGH (ref 0.44–1.00)
GFR, Estimated: 57 mL/min — ABNORMAL LOW (ref >60)
Glucose, Bld: 133 mg/dL — ABNORMAL HIGH (ref 70–99)
Potassium: 4 mmol/L (ref 3.5–5.1)
Sodium: 140 mmol/L (ref 135–145)
Total Bilirubin: 0.7 mg/dL (ref 0.3–1.2)
Total Protein: 6.7 g/dL (ref 6.5–8.1)

## 2020-12-22 LAB — URINALYSIS, ROUTINE W REFLEX MICROSCOPIC
Bilirubin Urine: NEGATIVE
Glucose, UA: >=500 mg/dL — AB
Hgb urine dipstick: NEGATIVE
Ketones, ur: NEGATIVE mg/dL
Leukocytes,Ua: NEGATIVE
Nitrite: NEGATIVE
Protein, ur: NEGATIVE mg/dL
Specific Gravity, Urine: 1.022 (ref 1.005–1.030)
pH: 5 (ref 5.0–8.0)

## 2020-12-22 LAB — PROTIME-INR
INR: 1 (ref 0.8–1.2)
Prothrombin Time: 12.6 seconds (ref 11.4–15.2)

## 2020-12-22 LAB — TYPE AND SCREEN
ABO/RH(D): A POS
Antibody Screen: NEGATIVE

## 2020-12-22 LAB — APTT: aPTT: 32 seconds (ref 24–36)

## 2020-12-22 NOTE — Progress Notes (Signed)
PCP - Cari Caraway, MD Cardiologist - Peter Martinique, MD Endocrinologist- Delrae Rend, MD Rheumatologist- Marella Chimes, PA-C  PPM/ICD - Denies  Chest x-ray - N/A EKG - 12/17/20 Stress Test - 05/17/16 ECHO - Per patient, unsure when and where. Cardiac Cath - 2004  Sleep Study - Yes, positive for OSA CPAP - Yes  Fasting Blood Sugar: 120-140 Checks Blood Sugar x1day. Last A1C 7.2 (11/04/20, per pat Done at [facility] office)) per Dr. Doug Sou Note 12/17/20. CBG at PAT appointment was 147.  Blood Thinner Instructions: N/A Aspirin Instructions: Pt instructed to contact surgeon's office for instructions  ERAS Protcol - N/A PRE-SURGERY Ensure or G2- N/A  COVID TEST- 12/28/20   Anesthesia review: Yes, cardiac hx.  Patient denies shortness of breath, fever, cough and chest pain at PAT appointment   All instructions explained to the patient, with a verbal understanding of the material. Patient agrees to go over the instructions while at home for a better understanding. Patient also instructed to self quarantine after being tested for COVID-19. The opportunity to ask questions was provided.

## 2020-12-23 NOTE — Anesthesia Preprocedure Evaluation (Addendum)
Anesthesia Evaluation  Patient identified by MRN, date of birth, ID band Patient awake    Reviewed: Allergy & Precautions, NPO status , Patient's Chart, lab work & pertinent test results, reviewed documented beta blocker date and time   History of Anesthesia Complications Negative for: history of anesthetic complications  Airway Mallampati: III  TM Distance: <3 FB Neck ROM: Full    Dental  (+) Dental Advisory Given, Teeth Intact   Pulmonary asthma , sleep apnea and Continuous Positive Airway Pressure Ventilation , Current Smoker and Patient abstained from smoking.,    Pulmonary exam normal        Cardiovascular hypertension, Pt. on medications and Pt. on home beta blockers + CAD, + Cardiac Stents, + Peripheral Vascular Disease and + DVT  Normal cardiovascular exam   Congenital absence of IVC  '22 Carotid US - 80-99% left ICAS, 40-59% right ICAS    Neuro/Psych PSYCHIATRIC DISORDERS Depression negative neurological ROS     GI/Hepatic Neg liver ROS, GERD  Controlled,  Endo/Other  diabetes, Type 2, Oral Hypoglycemic Agents Obesity   Renal/GU Renal disease Congenital unilateral kidney      Musculoskeletal  (+) Arthritis , Fibromyalgia -  Abdominal   Peds  Hematology negative hematology ROS (+)   Anesthesia Other Findings Covid test negative See PAT note  Reproductive/Obstetrics                           Anesthesia Physical Anesthesia Plan  ASA: III  Anesthesia Plan: General   Post-op Pain Management:    Induction: Intravenous  PONV Risk Score and Plan: 3 and Treatment may vary due to age or medical condition, Ondansetron, Aprepitant and Propofol infusion  Airway Management Planned: Oral ETT  Additional Equipment: Arterial line  Intra-op Plan:   Post-operative Plan: Extubation in OR  Informed Consent: I have reviewed the patients History and Physical, chart, labs and  discussed the procedure including the risks, benefits and alternatives for the proposed anesthesia with the patient or authorized representative who has indicated his/her understanding and acceptance.     Dental advisory given  Plan Discussed with: CRNA and Anesthesiologist  Anesthesia Plan Comments: ( )      Anesthesia Quick Evaluation

## 2020-12-23 NOTE — Progress Notes (Signed)
Anesthesia Chart Review:  Follows with cardiology for history of coronary disease with remote stenting of the ostium of the right coronary. Last Myoview in 2017 was normal.  Last seen by Dr. Martinique 12/17/2020 per note, "She is asymptomatic and able to do > 7 mets of activity. She is cleared to proceed with carotid endarterectomy."  OSA on CPAP.  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  Genital absence of left kidney.  DM2, last A1c in epic 7.4 on 04/16/2020.  Preop labs reviewed, unremarkable.  EKG 12/17/2020: NSR.  Rate 74.  LAD.  Carotid duplex 11/16/2020: Summary:  Right Carotid: Velocities in the right ICA are consistent with a 40-59% stenosis. Non-hemodynamically significant plaque <50% noted in the CCA.  Left Carotid: Velocities in the left ICA are consistent with a 80-99% stenosis. Non-hemodynamically significant plaque <50% noted in the CCA.  Vertebrals: Bilateral vertebral arteries demonstrate antegrade flow.  Subclavians: Normal flow hemodynamics were seen in bilateral subclavian  arteries.   Nuclear stress 05/17/2016:  The left ventricular ejection fraction is hyperdynamic (>65%).  Nuclear stress EF: 68%.  There was no ST segment deviation noted during stress.  The study is normal.  This is a low risk study.   Normal resting and stress perfusion. No ischemia or infarction EF 68%     Wynonia Musty [facility] Short Stay Center/Anesthesiology Phone [phone] 12/23/2020 11:18 AM

## 2020-12-25 DIAGNOSIS — E1165 Type 2 diabetes mellitus with hyperglycemia: Secondary | ICD-10-CM | POA: Diagnosis not present

## 2020-12-25 DIAGNOSIS — E782 Mixed hyperlipidemia: Secondary | ICD-10-CM | POA: Diagnosis not present

## 2020-12-25 DIAGNOSIS — M1990 Unspecified osteoarthritis, unspecified site: Secondary | ICD-10-CM | POA: Diagnosis not present

## 2020-12-25 DIAGNOSIS — F3341 Major depressive disorder, recurrent, in partial remission: Secondary | ICD-10-CM | POA: Diagnosis not present

## 2020-12-25 DIAGNOSIS — E1159 Type 2 diabetes mellitus with other circulatory complications: Secondary | ICD-10-CM | POA: Diagnosis not present

## 2020-12-25 DIAGNOSIS — I10 Essential (primary) hypertension: Secondary | ICD-10-CM | POA: Diagnosis not present

## 2020-12-25 DIAGNOSIS — M159 Polyosteoarthritis, unspecified: Secondary | ICD-10-CM | POA: Diagnosis not present

## 2020-12-25 DIAGNOSIS — G4733 Obstructive sleep apnea (adult) (pediatric): Secondary | ICD-10-CM | POA: Diagnosis not present

## 2020-12-25 DIAGNOSIS — I2510 Atherosclerotic heart disease of native coronary artery without angina pectoris: Secondary | ICD-10-CM | POA: Diagnosis not present

## 2020-12-28 ENCOUNTER — Other Ambulatory Visit (HOSPITAL_COMMUNITY)
Admission: RE | Admit: 2020-12-28 | Discharge: 2020-12-28 | Disposition: A | Discharge status: Home / Self Care | Payer: HMO | Source: Ambulatory Visit | Attending: Vascular Surgery | Admitting: Vascular Surgery

## 2020-12-28 ENCOUNTER — Other Ambulatory Visit: Payer: Self-pay

## 2020-12-28 ENCOUNTER — Ambulatory Visit
Admission: RE | Admit: 2020-12-28 | Discharge: 2020-12-28 | Disposition: A | Discharge status: Home / Self Care | Payer: HMO | Source: Ambulatory Visit | Attending: Family Medicine | Admitting: Family Medicine

## 2020-12-28 DIAGNOSIS — Z01812 Encounter for preprocedural laboratory examination: Secondary | ICD-10-CM | POA: Insufficient documentation

## 2020-12-28 DIAGNOSIS — Z20822 Contact with and (suspected) exposure to covid-19: Secondary | ICD-10-CM | POA: Insufficient documentation

## 2020-12-28 DIAGNOSIS — E2839 Other primary ovarian failure: Secondary | ICD-10-CM

## 2020-12-28 DIAGNOSIS — Z1231 Encounter for screening mammogram for malignant neoplasm of breast: Secondary | ICD-10-CM

## 2020-12-28 DIAGNOSIS — Z780 Asymptomatic menopausal state: Secondary | ICD-10-CM | POA: Diagnosis not present

## 2020-12-29 ENCOUNTER — Encounter (HOSPITAL_COMMUNITY): Payer: Self-pay | Admitting: Vascular Surgery

## 2020-12-29 ENCOUNTER — Inpatient Hospital Stay (HOSPITAL_COMMUNITY): Payer: HMO

## 2020-12-29 ENCOUNTER — Encounter (HOSPITAL_COMMUNITY)
Admission: RE | Disposition: A | Discharge status: Home / Self Care | Payer: Self-pay | Source: Home / Self Care | Attending: Vascular Surgery

## 2020-12-29 ENCOUNTER — Inpatient Hospital Stay (HOSPITAL_COMMUNITY): Payer: HMO | Admitting: Physician Assistant

## 2020-12-29 ENCOUNTER — Inpatient Hospital Stay (HOSPITAL_COMMUNITY)
Admission: RE | Admit: 2020-12-29 | Discharge: 2020-12-30 | DRG: 038 | Disposition: A | Discharge status: Home / Self Care | Payer: HMO | Attending: Vascular Surgery | Admitting: Vascular Surgery

## 2020-12-29 DIAGNOSIS — E1122 Type 2 diabetes mellitus with diabetic chronic kidney disease: Secondary | ICD-10-CM | POA: Diagnosis not present

## 2020-12-29 DIAGNOSIS — I6523 Occlusion and stenosis of bilateral carotid arteries: Secondary | ICD-10-CM | POA: Diagnosis not present

## 2020-12-29 DIAGNOSIS — Z888 Allergy status to other drugs, medicaments and biological substances status: Secondary | ICD-10-CM | POA: Diagnosis not present

## 2020-12-29 DIAGNOSIS — E119 Type 2 diabetes mellitus without complications: Secondary | ICD-10-CM | POA: Diagnosis not present

## 2020-12-29 DIAGNOSIS — I6529 Occlusion and stenosis of unspecified carotid artery: Secondary | ICD-10-CM | POA: Diagnosis present

## 2020-12-29 DIAGNOSIS — K219 Gastro-esophageal reflux disease without esophagitis: Secondary | ICD-10-CM | POA: Diagnosis not present

## 2020-12-29 DIAGNOSIS — E7800 Pure hypercholesterolemia, unspecified: Secondary | ICD-10-CM | POA: Diagnosis present

## 2020-12-29 DIAGNOSIS — F17210 Nicotine dependence, cigarettes, uncomplicated: Secondary | ICD-10-CM | POA: Diagnosis not present

## 2020-12-29 DIAGNOSIS — M797 Fibromyalgia: Secondary | ICD-10-CM | POA: Diagnosis present

## 2020-12-29 DIAGNOSIS — Z7982 Long term (current) use of aspirin: Secondary | ICD-10-CM

## 2020-12-29 DIAGNOSIS — Q5001 Congenital absence of ovary, unilateral: Secondary | ICD-10-CM

## 2020-12-29 DIAGNOSIS — M1990 Unspecified osteoarthritis, unspecified site: Secondary | ICD-10-CM | POA: Diagnosis not present

## 2020-12-29 DIAGNOSIS — E559 Vitamin D deficiency, unspecified: Secondary | ICD-10-CM | POA: Diagnosis present

## 2020-12-29 DIAGNOSIS — I129 Hypertensive chronic kidney disease with stage 1 through stage 4 chronic kidney disease, or unspecified chronic kidney disease: Secondary | ICD-10-CM | POA: Diagnosis not present

## 2020-12-29 DIAGNOSIS — I10 Essential (primary) hypertension: Secondary | ICD-10-CM | POA: Diagnosis not present

## 2020-12-29 DIAGNOSIS — J45909 Unspecified asthma, uncomplicated: Secondary | ICD-10-CM | POA: Diagnosis present

## 2020-12-29 DIAGNOSIS — Z79899 Other long term (current) drug therapy: Secondary | ICD-10-CM | POA: Diagnosis not present

## 2020-12-29 DIAGNOSIS — I2510 Atherosclerotic heart disease of native coronary artery without angina pectoris: Secondary | ICD-10-CM | POA: Diagnosis present

## 2020-12-29 DIAGNOSIS — Z7984 Long term (current) use of oral hypoglycemic drugs: Secondary | ICD-10-CM

## 2020-12-29 DIAGNOSIS — E7849 Other hyperlipidemia: Secondary | ICD-10-CM | POA: Diagnosis present

## 2020-12-29 DIAGNOSIS — N189 Chronic kidney disease, unspecified: Secondary | ICD-10-CM | POA: Diagnosis present

## 2020-12-29 DIAGNOSIS — I6522 Occlusion and stenosis of left carotid artery: Secondary | ICD-10-CM | POA: Diagnosis present

## 2020-12-29 DIAGNOSIS — G4730 Sleep apnea, unspecified: Secondary | ICD-10-CM | POA: Diagnosis present

## 2020-12-29 DIAGNOSIS — Q600 Renal agenesis, unilateral: Secondary | ICD-10-CM | POA: Diagnosis not present

## 2020-12-29 DIAGNOSIS — Z8249 Family history of ischemic heart disease and other diseases of the circulatory system: Secondary | ICD-10-CM | POA: Diagnosis not present

## 2020-12-29 DIAGNOSIS — E1151 Type 2 diabetes mellitus with diabetic peripheral angiopathy without gangrene: Secondary | ICD-10-CM | POA: Diagnosis present

## 2020-12-29 DIAGNOSIS — Z881 Allergy status to other antibiotic agents status: Secondary | ICD-10-CM

## 2020-12-29 DIAGNOSIS — Z86718 Personal history of other venous thrombosis and embolism: Secondary | ICD-10-CM | POA: Diagnosis not present

## 2020-12-29 HISTORY — PX: ENDARTERECTOMY: SHX5162

## 2020-12-29 LAB — GLUCOSE, CAPILLARY
Glucose-Capillary: 151 mg/dL — ABNORMAL HIGH (ref 70–99)
Glucose-Capillary: 160 mg/dL — ABNORMAL HIGH (ref 70–99)
Glucose-Capillary: 180 mg/dL — ABNORMAL HIGH (ref 70–99)
Glucose-Capillary: 181 mg/dL — ABNORMAL HIGH (ref 70–99)

## 2020-12-29 LAB — ABO/RH: ABO/RH(D): A POS

## 2020-12-29 LAB — SARS CORONAVIRUS 2 (TAT 6-24 HRS): SARS Coronavirus 2: NEGATIVE

## 2020-12-29 LAB — POCT ACTIVATED CLOTTING TIME: Activated Clotting Time: 285 seconds

## 2020-12-29 SURGERY — ENDARTERECTOMY, CAROTID
Anesthesia: General | Site: Neck | Laterality: Left

## 2020-12-29 MED ORDER — OXYBUTYNIN CHLORIDE ER 10 MG PO TB24
10.0000 mg | ORAL_TABLET | Freq: Every morning | ORAL | Status: DC
  Administered 2020-12-30: 10 mg via ORAL
  Filled 2020-12-29 (×2): qty 1

## 2020-12-29 MED ORDER — ASPIRIN EC 81 MG PO TBEC
81.0000 mg | DELAYED_RELEASE_TABLET | Freq: Every day | ORAL | Status: DC
  Administered 2020-12-29: 81 mg via ORAL
  Filled 2020-12-29: qty 1

## 2020-12-29 MED ORDER — LORATADINE 10 MG PO TABS
10.0000 mg | ORAL_TABLET | Freq: Every day | ORAL | Status: DC
  Administered 2020-12-30: 10 mg via ORAL
  Filled 2020-12-29 (×2): qty 1

## 2020-12-29 MED ORDER — SODIUM CHLORIDE 0.9 % IV SOLN: INTRAVENOUS | Status: DC

## 2020-12-29 MED ORDER — PROPOFOL 10 MG/ML IV BOLUS
INTRAVENOUS | Status: DC | PRN
  Administered 2020-12-29: 100 mg via INTRAVENOUS
  Administered 2020-12-29: 50 mg via INTRAVENOUS
  Administered 2020-12-29: 20 mg via INTRAVENOUS
  Administered 2020-12-29: 10 mg via INTRAVENOUS

## 2020-12-29 MED ORDER — DOCUSATE SODIUM 100 MG PO CAPS
100.0000 mg | ORAL_CAPSULE | Freq: Every day | ORAL | Status: DC
  Administered 2020-12-30: 100 mg via ORAL
  Filled 2020-12-29: qty 1

## 2020-12-29 MED ORDER — LIDOCAINE HCL (PF) 1 % IJ SOLN
INTRAMUSCULAR | Status: AC
  Filled 2020-12-29: qty 5

## 2020-12-29 MED ORDER — PROPOFOL 10 MG/ML IV BOLUS
INTRAVENOUS | Status: AC
  Filled 2020-12-29: qty 20

## 2020-12-29 MED ORDER — FENTANYL CITRATE (PF) 250 MCG/5ML IJ SOLN
INTRAMUSCULAR | Status: AC
  Filled 2020-12-29: qty 5

## 2020-12-29 MED ORDER — HEPARIN SODIUM (PORCINE) 1000 UNIT/ML IJ SOLN
INTRAMUSCULAR | Status: DC | PRN
  Administered 2020-12-29: 8000 [IU] via INTRAVENOUS

## 2020-12-29 MED ORDER — ONDANSETRON HCL 4 MG/2ML IJ SOLN: 4.0000 mg | Freq: Once | INTRAMUSCULAR | Status: DC | PRN

## 2020-12-29 MED ORDER — SODIUM CHLORIDE 0.9 % IV SOLN
0.0125 ug/kg/min | INTRAVENOUS | Status: AC
  Administered 2020-12-29: .1 ug/kg/min via INTRAVENOUS
  Filled 2020-12-29: qty 1000

## 2020-12-29 MED ORDER — ORAL CARE MOUTH RINSE: 15.0000 mL | Freq: Once | OROMUCOSAL | Status: AC

## 2020-12-29 MED ORDER — GUAIFENESIN-DM 100-10 MG/5ML PO SYRP: 15.0000 mL | ORAL_SOLUTION | ORAL | Status: DC | PRN

## 2020-12-29 MED ORDER — LIDOCAINE 2% (20 MG/ML) 5 ML SYRINGE
INTRAMUSCULAR | Status: AC
  Filled 2020-12-29: qty 5

## 2020-12-29 MED ORDER — METOPROLOL SUCCINATE ER 25 MG PO TB24
25.0000 mg | ORAL_TABLET | Freq: Every day | ORAL | Status: DC
  Filled 2020-12-29: qty 1

## 2020-12-29 MED ORDER — ONDANSETRON HCL 4 MG/2ML IJ SOLN
INTRAMUSCULAR | Status: DC | PRN
  Administered 2020-12-29: 4 mg via INTRAVENOUS

## 2020-12-29 MED ORDER — FENTANYL CITRATE (PF) 100 MCG/2ML IJ SOLN
25.0000 ug | INTRAMUSCULAR | Status: DC | PRN
  Administered 2020-12-29: 25 ug via INTRAVENOUS
  Administered 2020-12-29: 50 ug via INTRAVENOUS

## 2020-12-29 MED ORDER — ROCURONIUM BROMIDE 10 MG/ML (PF) SYRINGE
PREFILLED_SYRINGE | INTRAVENOUS | Status: DC | PRN
  Administered 2020-12-29: 60 mg via INTRAVENOUS

## 2020-12-29 MED ORDER — PHENYLEPHRINE HCL-NACL 10-0.9 MG/250ML-% IV SOLN
INTRAVENOUS | Status: DC | PRN
  Administered 2020-12-29: 30 ug/min via INTRAVENOUS
  Administered 2020-12-29: 50 ug/min via INTRAVENOUS

## 2020-12-29 MED ORDER — PHENOL 1.4 % MT LIQD
1.0000 | OROMUCOSAL | Status: DC | PRN
Start: 2020-12-29 — End: 2020-12-30
  Filled 2020-12-29: qty 177

## 2020-12-29 MED ORDER — EMPAGLIFLOZIN 25 MG PO TABS
25.0000 mg | ORAL_TABLET | Freq: Every day | ORAL | Status: DC
  Administered 2020-12-30: 25 mg via ORAL
  Filled 2020-12-29 (×3): qty 1

## 2020-12-29 MED ORDER — METOPROLOL TARTRATE 5 MG/5ML IV SOLN
2.0000 mg | INTRAVENOUS | Status: DC | PRN
Start: 2020-12-29 — End: 2020-12-30

## 2020-12-29 MED ORDER — CEFAZOLIN SODIUM-DEXTROSE 2-4 GM/100ML-% IV SOLN
2.0000 g | Freq: Three times a day (TID) | INTRAVENOUS | Status: AC
  Administered 2020-12-29 – 2020-12-30 (×2): 2 g via INTRAVENOUS
  Filled 2020-12-29 (×2): qty 100

## 2020-12-29 MED ORDER — LACTATED RINGERS IV SOLN: INTRAVENOUS | Status: DC

## 2020-12-29 MED ORDER — PROTAMINE SULFATE 10 MG/ML IV SOLN
INTRAVENOUS | Status: DC | PRN
  Administered 2020-12-29: 40 mg via INTRAVENOUS

## 2020-12-29 MED ORDER — DEXTROSE 5 % IV SOLN
INTRAVENOUS | Status: AC
  Filled 2020-12-29: qty 1000

## 2020-12-29 MED ORDER — ACETAMINOPHEN 650 MG RE SUPP: 325.0000 mg | RECTAL | Status: DC | PRN

## 2020-12-29 MED ORDER — PANTOPRAZOLE SODIUM 40 MG PO TBEC
40.0000 mg | DELAYED_RELEASE_TABLET | Freq: Every day | ORAL | Status: DC
  Administered 2020-12-29: 40 mg via ORAL
  Filled 2020-12-29 (×2): qty 1

## 2020-12-29 MED ORDER — DEXAMETHASONE SODIUM PHOSPHATE 10 MG/ML IJ SOLN
INTRAMUSCULAR | Status: AC
  Filled 2020-12-29: qty 1

## 2020-12-29 MED ORDER — SUGAMMADEX SODIUM 200 MG/2ML IV SOLN
INTRAVENOUS | Status: DC | PRN
  Administered 2020-12-29: 100 mg via INTRAVENOUS

## 2020-12-29 MED ORDER — MORPHINE SULFATE (PF) 2 MG/ML IV SOLN
INTRAVENOUS | Status: AC
  Filled 2020-12-29: qty 1

## 2020-12-29 MED ORDER — CEFAZOLIN SODIUM-DEXTROSE 2-4 GM/100ML-% IV SOLN
2.0000 g | INTRAVENOUS | Status: AC
  Administered 2020-12-29: 2 g via INTRAVENOUS
  Filled 2020-12-29: qty 100

## 2020-12-29 MED ORDER — CHLORHEXIDINE GLUCONATE CLOTH 2 % EX PADS: 6.0000 | MEDICATED_PAD | Freq: Once | CUTANEOUS | Status: DC

## 2020-12-29 MED ORDER — DEXTRAN 40 IN SALINE 10-0.9 % IV SOLN
INTRAVENOUS | Status: AC | PRN
  Administered 2020-12-29: 500 mL

## 2020-12-29 MED ORDER — HYDROXYCHLOROQUINE SULFATE 200 MG PO TABS
200.0000 mg | ORAL_TABLET | Freq: Two times a day (BID) | ORAL | Status: DC
  Administered 2020-12-29 – 2020-12-30 (×2): 200 mg via ORAL
  Filled 2020-12-29 (×2): qty 1

## 2020-12-29 MED ORDER — PHENYLEPHRINE 40 MCG/ML (10ML) SYRINGE FOR IV PUSH (FOR BLOOD PRESSURE SUPPORT)
PREFILLED_SYRINGE | INTRAVENOUS | Status: AC
  Filled 2020-12-29: qty 10

## 2020-12-29 MED ORDER — PHENYLEPHRINE HCL-NACL 10-0.9 MG/250ML-% IV SOLN
INTRAVENOUS | Status: AC
  Filled 2020-12-29: qty 250

## 2020-12-29 MED ORDER — LABETALOL HCL 5 MG/ML IV SOLN: 10.0000 mg | INTRAVENOUS | Status: DC | PRN

## 2020-12-29 MED ORDER — ROSUVASTATIN CALCIUM 5 MG PO TABS
10.0000 mg | ORAL_TABLET | ORAL | Status: DC
  Filled 2020-12-29: qty 2

## 2020-12-29 MED ORDER — ONDANSETRON HCL 4 MG/2ML IJ SOLN
4.0000 mg | Freq: Four times a day (QID) | INTRAMUSCULAR | Status: DC | PRN
  Administered 2020-12-30: 4 mg via INTRAVENOUS
  Filled 2020-12-29: qty 2

## 2020-12-29 MED ORDER — INSULIN GLARGINE 100 UNIT/ML ~~LOC~~ SOLN
19.0000 [IU] | Freq: Once | SUBCUTANEOUS | Status: AC
  Administered 2020-12-29: 19 [IU] via SUBCUTANEOUS
  Filled 2020-12-29: qty 0.19

## 2020-12-29 MED ORDER — DEXAMETHASONE SODIUM PHOSPHATE 10 MG/ML IJ SOLN
INTRAMUSCULAR | Status: DC | PRN
  Administered 2020-12-29: 5 mg via INTRAVENOUS

## 2020-12-29 MED ORDER — OXYCODONE-ACETAMINOPHEN 5-325 MG PO TABS
1.0000 | ORAL_TABLET | ORAL | Status: DC | PRN
  Administered 2020-12-29 – 2020-12-30 (×4): 2 via ORAL
  Filled 2020-12-29 (×4): qty 2

## 2020-12-29 MED ORDER — LACTATED RINGERS IV SOLN: INTRAVENOUS | Status: DC | PRN

## 2020-12-29 MED ORDER — EZETIMIBE 10 MG PO TABS
10.0000 mg | ORAL_TABLET | Freq: Every morning | ORAL | Status: DC
  Administered 2020-12-30: 10 mg via ORAL
  Filled 2020-12-29: qty 1

## 2020-12-29 MED ORDER — ALUM & MAG HYDROXIDE-SIMETH 200-200-20 MG/5ML PO SUSP: 15.0000 mL | ORAL | Status: DC | PRN

## 2020-12-29 MED ORDER — OXYCODONE HCL 5 MG PO TABS: 5.0000 mg | ORAL_TABLET | Freq: Once | ORAL | Status: AC | PRN

## 2020-12-29 MED ORDER — BISACODYL 5 MG PO TBEC: 5.0000 mg | DELAYED_RELEASE_TABLET | Freq: Every day | ORAL | Status: DC | PRN

## 2020-12-29 MED ORDER — OXYCODONE HCL 5 MG PO TABS
ORAL_TABLET | ORAL | Status: AC
  Administered 2020-12-29: 5 mg via ORAL
  Filled 2020-12-29: qty 1

## 2020-12-29 MED ORDER — LIDOCAINE-EPINEPHRINE 0.5 %-1:200000 IJ SOLN
INTRAMUSCULAR | Status: AC
  Filled 2020-12-29: qty 1

## 2020-12-29 MED ORDER — HYDRALAZINE HCL 20 MG/ML IJ SOLN: 5.0000 mg | INTRAMUSCULAR | Status: DC | PRN

## 2020-12-29 MED ORDER — MAGNESIUM SULFATE 2 GM/50ML IV SOLN: 2.0000 g | Freq: Every day | INTRAVENOUS | Status: DC | PRN

## 2020-12-29 MED ORDER — SODIUM CHLORIDE 0.9 % IV SOLN
0.0125 ug/kg/min | INTRAVENOUS | Status: DC
  Filled 2020-12-29: qty 1000

## 2020-12-29 MED ORDER — LIDOCAINE HCL (PF) 1 % IJ SOLN
INTRAMUSCULAR | Status: AC
  Filled 2020-12-29: qty 30

## 2020-12-29 MED ORDER — OXYCODONE HCL 5 MG/5ML PO SOLN
5.0000 mg | Freq: Once | ORAL | Status: AC | PRN
Start: 2020-12-29 — End: 2020-12-29

## 2020-12-29 MED ORDER — NITROGLYCERIN IN D5W 200-5 MCG/ML-% IV SOLN
INTRAVENOUS | Status: AC
  Filled 2020-12-29: qty 250

## 2020-12-29 MED ORDER — INSULIN ASPART 100 UNIT/ML ~~LOC~~ SOLN
0.0000 [IU] | Freq: Three times a day (TID) | SUBCUTANEOUS | Status: DC
  Administered 2020-12-29 – 2020-12-30 (×2): 3 [IU] via SUBCUTANEOUS

## 2020-12-29 MED ORDER — CHLORHEXIDINE GLUCONATE 0.12 % MT SOLN
15.0000 mL | Freq: Once | OROMUCOSAL | Status: AC
  Administered 2020-12-29: 15 mL via OROMUCOSAL
  Filled 2020-12-29: qty 15

## 2020-12-29 MED ORDER — FENTANYL CITRATE (PF) 250 MCG/5ML IJ SOLN
INTRAMUSCULAR | Status: DC | PRN
  Administered 2020-12-29: 75 ug via INTRAVENOUS
  Administered 2020-12-29: 25 ug via INTRAVENOUS

## 2020-12-29 MED ORDER — PHENYLEPHRINE 40 MCG/ML (10ML) SYRINGE FOR IV PUSH (FOR BLOOD PRESSURE SUPPORT)
PREFILLED_SYRINGE | INTRAVENOUS | Status: DC | PRN
  Administered 2020-12-29: 80 ug via INTRAVENOUS
  Administered 2020-12-29: 120 ug via INTRAVENOUS
  Administered 2020-12-29: 40 ug via INTRAVENOUS

## 2020-12-29 MED ORDER — INSULIN DEGLUDEC-LIRAGLUTIDE 100-3.6 UNIT-MG/ML ~~LOC~~ SOPN: 19.0000 [IU] | PEN_INJECTOR | Freq: Every day | SUBCUTANEOUS | Status: DC

## 2020-12-29 MED ORDER — SODIUM CHLORIDE 0.9 % IV SOLN: 500.0000 mL | Freq: Once | INTRAVENOUS | Status: DC | PRN

## 2020-12-29 MED ORDER — POTASSIUM CHLORIDE CRYS ER 20 MEQ PO TBCR: 20.0000 meq | EXTENDED_RELEASE_TABLET | Freq: Every day | ORAL | Status: DC | PRN

## 2020-12-29 MED ORDER — HEMOSTATIC AGENTS (NO CHARGE) OPTIME
TOPICAL | Status: DC | PRN
  Administered 2020-12-29: 1 via TOPICAL

## 2020-12-29 MED ORDER — MORPHINE SULFATE (PF) 2 MG/ML IV SOLN
2.0000 mg | INTRAVENOUS | Status: DC | PRN
Start: 2020-12-29 — End: 2020-12-30
  Administered 2020-12-29: 2 mg via INTRAVENOUS
  Filled 2020-12-29: qty 1

## 2020-12-29 MED ORDER — SODIUM CHLORIDE 0.9 % IV SOLN: INTRAVENOUS | Status: DC | PRN

## 2020-12-29 MED ORDER — ACETAMINOPHEN 325 MG PO TABS
325.0000 mg | ORAL_TABLET | ORAL | Status: DC | PRN
Start: 2020-12-29 — End: 2020-12-30
  Administered 2020-12-29: 650 mg via ORAL
  Filled 2020-12-29: qty 2

## 2020-12-29 MED ORDER — FENTANYL CITRATE (PF) 100 MCG/2ML IJ SOLN
INTRAMUSCULAR | Status: AC
  Administered 2020-12-29: 25 ug via INTRAVENOUS
  Filled 2020-12-29: qty 2

## 2020-12-29 MED ORDER — LIDOCAINE 2% (20 MG/ML) 5 ML SYRINGE
INTRAMUSCULAR | Status: DC | PRN
  Administered 2020-12-29: 60 mg via INTRAVENOUS

## 2020-12-29 MED ORDER — 0.9 % SODIUM CHLORIDE (POUR BTL) OPTIME
TOPICAL | Status: DC | PRN
  Administered 2020-12-29 (×2): 1000 mL

## 2020-12-29 MED ORDER — HEPARIN SODIUM (PORCINE) 1000 UNIT/ML IJ SOLN
INTRAMUSCULAR | Status: AC
  Filled 2020-12-29: qty 1

## 2020-12-29 MED ORDER — ROCURONIUM BROMIDE 10 MG/ML (PF) SYRINGE
PREFILLED_SYRINGE | INTRAVENOUS | Status: AC
  Filled 2020-12-29: qty 10

## 2020-12-29 MED ORDER — LISINOPRIL 2.5 MG PO TABS
2.5000 mg | ORAL_TABLET | Freq: Every day | ORAL | Status: DC
  Administered 2020-12-29: 2.5 mg via ORAL
  Filled 2020-12-29: qty 1

## 2020-12-29 MED ORDER — SODIUM CHLORIDE 0.9 % IV SOLN
INTRAVENOUS | Status: AC
  Filled 2020-12-29: qty 1.2

## 2020-12-29 MED ORDER — POLYETHYLENE GLYCOL 3350 17 G PO PACK: 17.0000 g | PACK | Freq: Every day | ORAL | Status: DC | PRN

## 2020-12-29 MED ORDER — ONDANSETRON HCL 4 MG/2ML IJ SOLN
INTRAMUSCULAR | Status: AC
  Filled 2020-12-29: qty 2

## 2020-12-29 SURGICAL SUPPLY — 45 items
BAG DECANTER FOR FLEXI CONT (MISCELLANEOUS) ×2 IMPLANT
CANISTER SUCT 3000ML PPV (MISCELLANEOUS) ×2 IMPLANT
CANNULA VESSEL 3MM 2 BLNT TIP (CANNULA) ×8 IMPLANT
CATH ROBINSON RED A/P 18FR (CATHETERS) ×2 IMPLANT
CLIP VESOCCLUDE MED 24/CT (CLIP) ×2 IMPLANT
CLIP VESOCCLUDE SM WIDE 24/CT (CLIP) ×2 IMPLANT
COVER WAND RF STERILE (DRAPES) ×2 IMPLANT
DERMABOND ADVANCED (GAUZE/BANDAGES/DRESSINGS) ×1
DERMABOND ADVANCED .7 DNX12 (GAUZE/BANDAGES/DRESSINGS) ×1 IMPLANT
DRAIN CHANNEL 15F RND FF W/TCR (WOUND CARE) IMPLANT
ELECT CAUTERY BLADE 6.4 (BLADE) ×4 IMPLANT
ELECT REM PT RETURN 9FT ADLT (ELECTROSURGICAL) ×2
ELECTRODE REM PT RTRN 9FT ADLT (ELECTROSURGICAL) ×1 IMPLANT
EVACUATOR SILICONE 100CC (DRAIN) IMPLANT
GAUZE 4X4 16PLY RFD (DISPOSABLE) ×2 IMPLANT
GLOVE BIO SURGEON STRL SZ7.5 (GLOVE) ×2 IMPLANT
GLOVE SRG 8 PF TXTR STRL LF DI (GLOVE) ×1 IMPLANT
GLOVE SURG UNDER POLY LF SZ7 (GLOVE) ×2 IMPLANT
GLOVE SURG UNDER POLY LF SZ8 (GLOVE) ×2
GOWN STRL REUS W/ TWL LRG LVL3 (GOWN DISPOSABLE) ×4 IMPLANT
GOWN STRL REUS W/TWL LRG LVL3 (GOWN DISPOSABLE) ×8
GRAFT VASC PATCH XENOSURE 1X14 (Vascular Products) ×2 IMPLANT
HEMOSTAT HEMOBLAST BELLOWS (HEMOSTASIS) ×2 IMPLANT
KIT BASIN OR (CUSTOM PROCEDURE TRAY) ×2 IMPLANT
KIT SHUNT ARGYLE CAROTID ART 6 (VASCULAR PRODUCTS) ×2 IMPLANT
KIT TURNOVER KIT B (KITS) ×2 IMPLANT
NEEDLE HYPO 25GX1X1/2 BEV (NEEDLE) ×2 IMPLANT
NS IRRIG 1000ML POUR BTL (IV SOLUTION) ×4 IMPLANT
PACK CAROTID (CUSTOM PROCEDURE TRAY) ×2 IMPLANT
PAD ARMBOARD 7.5X6 YLW CONV (MISCELLANEOUS) ×4 IMPLANT
PENCIL BUTTON HOLSTER BLD 10FT (ELECTRODE) ×4 IMPLANT
POSITIONER HEAD DONUT 9IN (MISCELLANEOUS) ×2 IMPLANT
SET WALTER ACTIVATION W/DRAPE (SET/KITS/TRAYS/PACK) ×2 IMPLANT
SHUNT CAROTID BYPASS 10 (VASCULAR PRODUCTS) IMPLANT
SHUNT CAROTID BYPASS 12 (VASCULAR PRODUCTS) IMPLANT
SPONGE SURGIFOAM ABS GEL 100 (HEMOSTASIS) IMPLANT
SUT MNCRL AB 4-0 PS2 18 (SUTURE) ×2 IMPLANT
SUT PROLENE 6 0 BV (SUTURE) ×4 IMPLANT
SUT SILK 2 0 PERMA HAND 18 BK (SUTURE) IMPLANT
SUT VIC AB 3-0 SH 27 (SUTURE) ×2
SUT VIC AB 3-0 SH 27X BRD (SUTURE) ×1 IMPLANT
SYR 20ML LL LF (SYRINGE) ×2 IMPLANT
SYR CONTROL 10ML LL (SYRINGE) ×2 IMPLANT
TOWEL GREEN STERILE (TOWEL DISPOSABLE) ×2 IMPLANT
WATER STERILE IRR 1000ML POUR (IV SOLUTION) ×2 IMPLANT

## 2020-12-29 NOTE — Transfer of Care (Signed)
Immediate Anesthesia Transfer of Care Note  Patient: Sandra [name]  Procedure(s) Performed: LEFT CAROTID ENDARTERECTOMY (Left Neck)  Patient Location: PACU  Anesthesia Type:General  Level of Consciousness: awake, alert  and oriented  Airway & Oxygen Therapy: Patient Spontanous Breathing and Patient connected to face mask oxygen  Post-op Assessment: Report given to RN and Post -op Vital signs reviewed and stable  Post vital signs: Reviewed and stable  Last Vitals:  Vitals Value Taken Time  BP 137/63 12/29/20 1019  Temp    Pulse 81 12/29/20 1030  Resp 12 12/29/20 1030  SpO2 93 % 12/29/20 1030  Vitals shown include unvalidated device data.  Last Pain:  Vitals:   12/29/20 0628  TempSrc:   PainSc: 5       Patients Stated Pain Goal: 5 (56/43/32 9518)  Complications: No complications documented.

## 2020-12-29 NOTE — Progress Notes (Signed)
PHARMACIST LIPID MONITORING   Sandra [name] is a 75 y.o. female admitted on 12/29/2020 with BL carotid artery stenosis s/o L carotid endarterectomy.  Pharmacy has been consulted to optimize lipid-lowering therapy with the indication of secondary prevention for clinical ASCVD.  Recent Labs:  Lipid Panel (last 6 months):   No results found for: CHOL, TRIG, HDL, CHOLHDL, VLDL, LDLCALC, LDLDIRECT  Hepatic function panel (last 6 months):   Lab Results  Component Value Date   AST 26 12/22/2020   ALT 26 12/22/2020   ALKPHOS 55 12/22/2020   BILITOT 0.7 12/22/2020    SCr (since admission):   Serum creatinine: 1.02 mg/dL (H) 12/22/20 1000 Estimated creatinine clearance: 46.9 mL/min (A)  Current therapy and lipid therapy tolerance Current lipid-lowering therapy: rosuvastatin 10mg  Every Tuesday, ezetimibe 10mg  daily  Previous lipid-lowering therapies (if applicable): Repatha, Praluent, had myalgias  Documented or reported allergies or intolerances to lipid-lowering therapies (if applicable): Myalgias to rosuvastatin & repatha   Assessment:   75 yo W s/p L carotid endarterectomy. Pharmacy consulted to maximize lipid therapy. Patient with reported myalgias to rosuvastatin and Repatha. Tried Praluent Oct 2021 as well. Patient is taking rosuvastatin 10mg  once weekly and ezetimibe 10mg  daily. Patient has already been referred to [facility] with appointment on 01/18/21.   Plan:    1.Statin intensity (high intensity recommended for all patients regardless of the LDL):  Statin intolerance noted. No statin changes due to serious side effects (ex. Myalgias with at least 2 different statins).  2.Add ezetimibe : already on this   3.Refer to lipid clinic:   Yes  - already referred by Dr. Martinique 12/17/20 for Inclisaren or Nexlizet. Has appointment with Lipid clinic of 01/18/21   4.Follow-up with:  Cardiology provider - No primary care provider on file.  5.Follow-up labs after discharge:  No changes in  lipid therapy, repeat a lipid panel in one year.      Sandra [name], PharmD, BCPS, BCCP Clinical Pharmacist  Please check AMION for all Sandra [name] phone numbers After 10:00 PM, call [place] [phone]

## 2020-12-29 NOTE — Progress Notes (Signed)
Pt arrived to floor from PACU, VSS, CHG completed, tele initiated.  Orders released, pt complains of pain 6/10 will medicate per MAR.

## 2020-12-29 NOTE — Op Note (Signed)
NAME: Sandra [name]    MRN: [id number] DOB: 10/07/1945    DATE OF OPERATION: 12/29/2020  PREOP DIAGNOSIS:    Greater than 80% asymptomatic left carotid stenosis  POSTOP DIAGNOSIS:    Same  PROCEDURE:    Left carotid endarterectomy with bovine pericardial patch angioplasty  SURGEON: Judeth Cornfield. Scot Dock, MD  ASSIST: Curt Jews MD  ANESTHESIA: General  EBL: Minimal  INDICATIONS:    Sandra [name] is a 75 y.o. female who I been following with bilateral carotid disease.  The left carotid stenosis progressed to greater than 80%.  Of note by CT angiogram the plaque extended very low into the common carotid artery and extended fairly high up the internal carotid artery.  Given the disease in the common carotid artery she was not a candidate for transcarotid stenting.  She had significant disease in her aortic arch also.  Therefore I felt the best option was a left carotid endarterectomy despite the technical challenges.  FINDINGS:   The plaque extended very low into the common carotid artery but I was able to successfully remove all of this plaque.  There was a nice taper distally in the internal carotid artery.  TECHNIQUE:   The patient was taken to the operating room and received a general anesthetic.  Monitoring lines had been placed by anesthesia.  The left neck was prepped and draped in usual sterile fashion.  A longitudinal incision was made along the anterior border of the sternocleidomastoid and the dissection carried down to the common carotid artery which was dissected free and controlled with Rummel tourniquet.  The patient was heparinized.  I then divided the facial vein between 2-0 silk ties.  The internal carotid artery was controlled above the plaque.  Of note the plaque extended fairly high up the internal carotid artery.  The external carotid artery was controlled.  ACT was monitored.  Clamps were then placed on the internal and the common and the external carotid  artery.  A longitudinal arteriotomy was made in the common carotid artery and this was extended through the plaque into the internal carotid artery and then backbled and placed into the common carotid artery and secured with Rummel tourniquet.  Flow was reestablished through the shunt and flow was checked with a Doppler.  An endarterectomy plane was established proximally and the plaque was sharply divided.  I was able to get the bulky calcific plaque in its entirety.  Eversion endarterectomy was performed of the external carotid artery.  The plaque in the internal carotid artery extended quite high but I was able to successfully endarterectomized this with a nice taper.  The artery was irrigated with copious amounts of heparin and dextran and all loose debris removed.  A long bovine pericardial patch was tailored and then sewn with continuous 6-0 Prolene suture.  Prior to completing the patch closure the artery was backbled and flushed appropriately and the anastomosis completed.  Flow was reestablished first to the external carotid artery and into the internal carotid artery.  At the completion there was a good pulse distal to the patch and good Doppler signal with good diastolic flow.  The heparin was partially reversed with protamine.  Hemostasis was obtained in the wound.  The wound was closed with a deep layer of 3-0 Vicryl, the platysma was closed with running 3-0 Vicryl, and the skin was closed with a 4-0 Monocryl.  Dermabond was applied.  The patient awoke neurologically intact.  All needle and sponge  counts were correct.  The patient tolerated the procedure well was transferred to recovery room in stable condition.  All needle and sponge counts were correct.  Given the complexity of the case a first assistant was necessary in order to expedient the procedure and safely perform the technical aspects of the operation.  Deitra Mayo, MD, FACS [facility]  DATE  OF DICTATION:   12/29/2020

## 2020-12-29 NOTE — Interval H&P Note (Signed)
History and Physical Interval Note:  12/29/2020 7:15 AM  Sandra [name]  has presented today for surgery, with the diagnosis of BILATERAL CAROTID ARTERY STENOSIS.  The various methods of treatment have been discussed with the patient and family. After consideration of risks, benefits and other options for treatment, the patient has consented to  Procedure(s): LEFT CAROTID ENDARTERECTOMY (Left) as a surgical intervention.  [name] history has been reviewed, patient examined, no change in status, stable for surgery.  I have reviewed the patient's chart and labs.  Questions were answered to the patient's satisfaction.     Deitra Mayo

## 2020-12-29 NOTE — Anesthesia Postprocedure Evaluation (Signed)
Anesthesia Post Note  Patient: Sandra [name]  Procedure(s) Performed: LEFT CAROTID ENDARTERECTOMY (Left Neck)     Patient location during evaluation: PACU Anesthesia Type: General Level of consciousness: awake and alert Pain management: pain level controlled Vital Signs Assessment: post-procedure vital signs reviewed and stable Respiratory status: spontaneous breathing, nonlabored ventilation, respiratory function stable and patient connected to nasal cannula oxygen Cardiovascular status: blood pressure returned to baseline and stable Postop Assessment: no apparent nausea or vomiting Anesthetic complications: no   No complications documented.  Last Vitals:  Vitals:   12/29/20 1220 12/29/20 1235  BP: 130/62 120/63  Pulse: 77 79  Resp: 15 17  Temp:    SpO2: 96% 93%    Last Pain:  Vitals:   12/29/20 1150  TempSrc:   PainSc: [place]

## 2020-12-29 NOTE — Anesthesia Procedure Notes (Addendum)
Procedure Name: Intubation Date/Time: 12/29/2020 7:52 AM Performed by: Trinna Post., CRNA Pre-anesthesia Checklist: Patient identified, Emergency Drugs available, Suction available and Patient being monitored Patient Re-evaluated:Patient Re-evaluated prior to induction Oxygen Delivery Method: Circle system utilized Preoxygenation: Pre-oxygenation with 100% oxygen Induction Type: IV induction Ventilation: Mask ventilation without difficulty Laryngoscope Size: Glidescope and 3 Grade View: Grade I Tube type: Oral Tube size: 7.0 mm Number of attempts: 4 Airway Equipment and Method: Rigid stylet,  Video-laryngoscopy and Oral airway Placement Confirmation: ETT inserted through vocal cords under direct vision,  positive ETCO2 and breath sounds checked- equal and bilateral Secured at: 21 cm Tube secured with: Tape Dental Injury: Teeth and Oropharynx as per pre-operative assessment  Difficulty Due To: Difficult Airway- due to anterior larynx, Difficult Airway- due to limited oral opening and Difficulty was anticipated Comments: First attempt with MAC 3 DVL by SRNA, viewed only posterior arytenoids; second attempt w miller 2 by CRNA grade 2; third attempt by MD with miller 2 grade 2B view, unable to angle ETT anteriorly enough for successful intubation; video laryngoscopy used to reveal anterior larynx, successfully placed 7.0 ETT under direct visualization.

## 2020-12-29 NOTE — Anesthesia Procedure Notes (Signed)
Arterial Line Insertion Start/End4/26/2022 7:09 AM, 12/29/2020 7:15 AM Performed by: Audry Pili, MD, Trinna Post., CRNA, CRNA  Patient location: Pre-op. Preanesthetic checklist: patient identified, IV checked, site marked, risks and benefits discussed, surgical consent, monitors and equipment checked, pre-op evaluation, timeout performed and anesthesia consent Lidocaine 1% used for infiltration Left, radial was placed Catheter size: 20 G Hand hygiene performed , maximum sterile barriers used  and Seldinger technique used Allen's test indicative of satisfactory collateral circulation Attempts: 1 Procedure performed without using ultrasound guided technique. Following insertion, dressing applied and Biopatch. Post procedure assessment: normal  Patient tolerated the procedure well with no immediate complications.

## 2020-12-29 NOTE — Progress Notes (Addendum)
  Day of Surgery Note    Subjective:  Expected post-op pain   Vitals:   12/29/20 1307 12/29/20 1450  BP: 126/65 123/85  Pulse: 81 77  Resp: 16 20  Temp: (!) 97.5 F (36.4 C) 98.1 F (36.7 C)  SpO2: 95% 95%    Incisions:   Soft; No bleeding or hematoma Extremities:  Moves all well. 5/5 hand grip strength Cardiac:  RRR Lungs:  Non-labored  Neuro: A and O times 4, tongue midline, face symmetric   Assessment/Plan:  This is a 75 y.o. female who is s/p left CEA. Hemodynamically stable. Neurologically intact.   Risa Grill, PA-C  [phone]  I have interviewed the patient and examined the patient. I agree with the findings by the PA.  Gae Gallop, MD [phone]

## 2020-12-30 ENCOUNTER — Encounter (HOSPITAL_COMMUNITY): Payer: Self-pay | Admitting: Vascular Surgery

## 2020-12-30 LAB — GLUCOSE, CAPILLARY: Glucose-Capillary: 151 mg/dL — ABNORMAL HIGH (ref 70–99)

## 2020-12-30 LAB — LIPID PANEL
Cholesterol: 155 mg/dL (ref 0–200)
HDL: 48 mg/dL (ref >40)
LDL Cholesterol: 83 mg/dL (ref 0–99)
Total CHOL/HDL Ratio: 3.2 RATIO
Triglycerides: 122 mg/dL (ref <150)
VLDL: 24 mg/dL (ref 0–40)

## 2020-12-30 LAB — BASIC METABOLIC PANEL
Anion gap: 8 (ref 5–15)
BUN: 14 mg/dL (ref 8–23)
CO2: 25 mmol/L (ref 22–32)
Calcium: 8.5 mg/dL — ABNORMAL LOW (ref 8.9–10.3)
Chloride: 106 mmol/L (ref 98–111)
Creatinine, Ser: 0.84 mg/dL (ref 0.44–1.00)
GFR, Estimated: >60 mL/min (ref >60)
Glucose, Bld: 159 mg/dL — ABNORMAL HIGH (ref 70–99)
Potassium: 4 mmol/L (ref 3.5–5.1)
Sodium: 139 mmol/L (ref 135–145)

## 2020-12-30 LAB — CBC
HCT: 43.9 % (ref 36.0–46.0)
Hemoglobin: 14.3 g/dL (ref 12.0–15.0)
MCH: 30.8 pg (ref 26.0–34.0)
MCHC: 32.6 g/dL (ref 30.0–36.0)
MCV: 94.6 fL (ref 80.0–100.0)
Platelets: 196 10*3/uL (ref 150–400)
RBC: 4.64 MIL/uL (ref 3.87–5.11)
RDW: 13.6 % (ref 11.5–15.5)
WBC: 12.9 10*3/uL — ABNORMAL HIGH (ref 4.0–10.5)
nRBC: 0 % (ref 0.0–0.2)

## 2020-12-30 MED ORDER — HYDROCODONE-ACETAMINOPHEN 5-325 MG PO TABS: 1.0000 | ORAL_TABLET | Freq: Four times a day (QID) | ORAL | 0 refills | Status: AC | PRN

## 2020-12-30 NOTE — Discharge Summary (Signed)
Discharge Summary     Sandra [name] December 25, 1945 75 y.o. female  [id number]  Admission Date: 12/29/2020  Discharge Date: 12/30/2020  Physician: Angelia Mould, *  Admission Diagnosis: Carotid artery stenosis [I65.29] Left carotid artery stenosis [I65.22]   HPI:   This is a 75 y.o. female has an asymptomatic greater than 80% left carotid stenosis with a 40 to 59% right carotid stenosis.  She is asymptomatic.  However given the progression of the stenosis on the left to greater than 80% I have recommended left carotid endarterectomy in order to lower her risk of future stroke.  I reviewed her CT angiogram and based on the anatomic criteria I do not think she is a candidate for transcarotid stenting.  She also has significant disease of her aortic arch and would be at high risk for transfemoral carotid stenting.  I think she is a candidate for carotid endarterectomy although I am somewhat concerned that the stenosis is somewhat high.  However I think this is the best option all things considered.  We have discussed the indications for the procedure and the potential complications including the 1 to 2% risk of perioperative stroke.  Other complications include but are not limited to nerve injury and cardiac events.  She is scheduled to see Dr. Peter Martinique tomorrow and I will send him a note to be sure that he knows she is being considered for surgery.  Tentatively her surgery is scheduled for 12/29/2020.  She knows to remain on her aspirin and statin perioperatively.  We have also discussed the importance of tobacco cessation as she is started smoking again.  Hospital Course:  The patient was admitted to the hospital and taken to the operating room on 12/29/2020 and underwent left carotid endarterectomy.    Findings: The plaque extended very low into the common carotid artery but I was able to successfully remove all of this plaque.  There was a nice taper distally in the internal  carotid artery.  The pt tolerated the procedure well and was transported to the PACU in good condition.   By POD 1, the pt neuro status is in tact.  She was able to void, ambulate and take in solids without difficulty.    Recent Labs    12/30/20 0358  NA 139  K 4.0  CL 106  CO2 25  GLUCOSE 159*  BUN 14  CALCIUM 8.5*   Recent Labs    12/30/20 0358  WBC 12.9*  HGB 14.3  HCT 43.9  PLT 196   No results for input(s): INR in the last 72 hours.   Discharge Instructions    Discharge patient   Complete by: As directed    Dc home after pt has eaten, voided and ambulated.  Thanks   Discharge disposition: 01-Home or Self Care   Discharge patient date: 12/30/2020      Discharge Diagnosis:  Carotid artery stenosis [I65.29] Left carotid artery stenosis [I65.22]  Secondary Diagnosis: Patient Active Problem List   Diagnosis Date Noted  . Carotid artery stenosis 12/29/2020  . Left carotid artery stenosis 12/29/2020  . Depression 11/14/2018  . Essential hypertension 08/14/2018  . Class 1 obesity with serious comorbidity and body mass index (BMI) of 31.0 to 31.9 in adult 07/16/2018  . Diabetes mellitus type 2 in obese ([place]) 07/16/2018  . 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 Coronary artery disease   . Other hyperlipidemia   . Tobacco dependence   . Sleep apnea   . Congenital absence  of left kidney    Past Medical History:  Diagnosis Date  . Absence of inferior vena cava 2013  . Arthritis   . Asthma   . Back pain   . Chronic kidney disease   . Congenital absence of inferior vena cava   . Congenital absence of left kidney   . Congenital absence of left ovary   . Coronary artery disease 2004   STATUS POST STENTING OF THE RIGHT CORONARY OSTIUM   . DDD (degenerative disc disease)   . Diabetes mellitus without complication ([place])   . Dislocated shoulder    left  . DVT (deep venous thrombosis) ([place]) 2014   left posterior tibial vein  . Fibromyalgia   . GERD (gastroesophageal  reflux disease)   . High blood pressure   . Hypercholesterolemia   . Joint pain   . Osteoarthritis   . Peripheral vascular disease ([place])   . Sleep apnea   . Sleep apnea   . SOB (shortness of breath)   . Tobacco dependence   . Vitamin D deficiency     Allergies as of 12/30/2020      Reactions   Erythromycin    Repatha [evolocumab]    myalgias   Rosuvastatin Calcium    Other reaction(s): myalgias   Epinephrine Palpitations   Metformin And Related Rash      Medication List    TAKE these medications   aspirin EC 81 MG tablet Take 1 tablet (81 mg total) by mouth daily. What changed: when to take this   Azelaic Acid 15 % gel Apply 1 application topically daily.   Calcium-Vitamin D 600-400 MG-UNIT Tabs Take 1 tablet by mouth in the morning and at bedtime.   cetirizine 10 MG tablet Commonly known as: ZYRTEC Take 10 mg by mouth daily.   Cinnamon 500 MG capsule Take 2,000 mg by mouth in the morning and at bedtime.   Coenzyme Q10 200 MG capsule Take 200 mg by mouth at bedtime.   empagliflozin 25 MG Tabs tablet Commonly known as: JARDIANCE Take 25 mg by mouth daily.   ezetimibe 10 MG tablet Commonly known as: ZETIA Take 1 tablet (10 mg total) by mouth daily. NEEDS OV FOR FUTURE REFILL What changed:   when to take this  additional instructions   fluticasone 50 MCG/ACT nasal spray Commonly known as: FLONASE Place 2 sprays into the nose daily as needed for allergies.   HYDROcodone-acetaminophen 5-325 MG tablet Commonly known as: NORCO/VICODIN Take 1 tablet by mouth every 6 (six) hours as needed for moderate pain.   hydroxychloroquine 200 MG tablet Commonly known as: PLAQUENIL Take 200 mg by mouth 2 (two) times daily.   lisinopril 2.5 MG tablet Commonly known as: ZESTRIL Take 2.5 mg by mouth at bedtime.   meloxicam 7.5 MG tablet Commonly known as: MOBIC Take 7.5 mg by mouth in the morning.   metoprolol succinate 25 MG 24 hr tablet Commonly known as:  TOPROL-XL Take 25 mg by mouth daily.   metroNIDAZOLE 0.75 % cream Commonly known as: METROCREAM Apply 1 application topically 2 (two) times daily.   OneTouch Verio test strip Generic drug: glucose blood SMARTSIG:90 Via Meter Twice Daily   oxybutynin 10 MG 24 hr tablet Commonly known as: DITROPAN-XL Take 10 mg by mouth in the morning.   rosuvastatin 10 MG tablet Commonly known as: CRESTOR Take 1 tablet by mouth weekly What changed:   how much to take  how to take this  when to take this  additional instructions   Xultophy 100-3.6 UNIT-MG/ML Sopn Generic drug: Insulin Degludec-Liraglutide Inject 19 Units into the skin at bedtime.        [facility] Discharge Instructions Carotid Endarterectomy (CEA)  Please refer to the following instructions for your post-procedure care. Your surgeon or physician assistant will discuss any changes with you.  Activity  You are encouraged to walk as much as you can. You can slowly return to normal activities but must avoid strenuous activity and heavy lifting until your doctor tell you it's OK. Avoid activities such as vacuuming or swinging a golf club. You can drive after one week if you are comfortable and you are no longer taking prescription pain medications. It is normal to feel tired for serval weeks after your surgery. It is also normal to have difficulty with sleep habits, eating, and bowel movements after surgery. These will go away with time.  Bathing/Showering  You may shower after you come home. Do not soak in a bathtub, hot tub, or swim until the incision heals completely.  Incision Care  Shower every day. Clean your incision with mild soap and water. Pat the area dry with a clean towel. You do not need a bandage unless otherwise instructed. Do not apply any ointments or creams to your incision. You may have skin glue on your incision. Do not peel it off. It will come off on its own in about  one week. Your incision may feel thickened and raised for several weeks after your surgery. This is normal and the skin will soften over time. For Men Only: It's OK to shave around the incision but do not shave the incision itself for 2 weeks. It is common to have numbness under your chin that could last for several months.  Diet  Resume your normal diet. There are no special food restrictions following this procedure. A low fat/low cholesterol diet is recommended for all patients with vascular disease. In order to heal from your surgery, it is CRITICAL to get adequate nutrition. Your body requires vitamins, minerals, and protein. Vegetables are the best source of vitamins and minerals. Vegetables also provide the perfect balance of protein. Processed food has little nutritional value, so try to avoid this.  Medications  Resume taking all of your medications unless your doctor or physician assistant tells you not to.  If your incision is causing pain, you may take over-the- counter pain relievers such as acetaminophen (Tylenol). If you were prescribed a stronger pain medication, please be aware these medications can cause nausea and constipation.  Prevent nausea by taking the medication with a snack or meal. Avoid constipation by drinking plenty of fluids and eating foods with a high amount of fiber, such as fruits, vegetables, and grains.  Do not take Tylenol if you are taking prescription pain medications.  Follow Up  Our office will schedule a follow up appointment 2-3 weeks following discharge.  Please call us immediately for any of the following conditions  . Increased pain, redness, drainage (pus) from your incision site. . Fever of 101 degrees or higher. . If you should develop stroke (slurred speech, difficulty swallowing, weakness on one side of your body, loss of vision) you should call 911 and go to the nearest emergency room. .  Reduce your risk of vascular disease:  . Stop  smoking. If you would like help call [facility] at 1-800-QUIT-NOW (630)349-4012) or [place] at [phone]. . Manage your cholesterol . Maintain a desired weight .  Control your diabetes . Keep your blood pressure down .  If you have any questions, please call the office at [phone].  Prescriptions given: 1.   Norco #8 No Refill  Disposition: home  Patient's condition: is Good  Follow up: 1.  [facility]  in 3-4 weeks.   Leontine Locket, PA-C [facility] [phone]   --- For [facility] use ---   Modified Rankin score at D/C (0-6): 0  IV medication needed for:  1. Hypertension: No 2. Hypotension: No  Post-op Complications: No  1. Post-op CVA or TIA: No  If yes: Event classification (right eye, left eye, right cortical, left cortical, verterobasilar, other): n/a  If yes: Timing of event (intra-op, <6 hrs post-op, >=6 hrs post-op, unknown): n/a  2. CN injury: No  If yes: CN n/a injuried   3. Myocardial infarction: No  If yes: Dx by (EKG or clinical, Troponin): n/a  4.  CHF: No  5.  Dysrhythmia (new): No  6. Wound infection: No  7. Reperfusion symptoms: No  8. Return to OR: No  If yes: return to OR for (bleeding, neurologic, other CEA incision, other): n/a  Discharge medications: Statin use:  Yes ASA use:  Yes   Beta blocker use:  No ACE-Inhibitor use:  Yes  ARB use:  No CCB use: No P2Y12 Antagonist use: No, [ ]  Plavix, [ ]  Plasugrel, [ ]  Ticlopinine, [ ]  Ticagrelor, [ ]  Other, [ ]  No for medical reason, [ ]  Non-compliant, [ ]  Not-indicated Anti-coagulant use:  No, [ ]  Warfarin, [ ]  Rivaroxaban, [ ]  Dabigatran,

## 2020-12-30 NOTE — Discharge Instructions (Signed)
   [facility] [place]  Discharge Instructions   Carotid Surgery  Please refer to the following instructions for your post-procedure care. Your surgeon or physician assistant will discuss any changes with you.  Activity  You are encouraged to walk as much as you can. You can slowly return to normal activities but must avoid strenuous activity and heavy lifting until your doctor tell you it's okay. Avoid activities such as vacuuming or swinging a golf club. You can drive after one week if you are comfortable and you are no longer taking prescription pain medications. It is normal to feel tired for serval weeks after your surgery. It is also normal to have difficulty with sleep habits, eating, and bowel movements after surgery. These will go away with time.  Bathing/Showering  Shower daily after you go home. Do not soak in a bathtub, hot tub, or swim until the incision heals completely.  Incision Care  Shower every day. Clean your incision with mild soap and water. Pat the area dry with a clean towel. You do not need a bandage unless otherwise instructed. Do not apply any ointments or creams to your incision. You may have skin glue on your incision. Do not peel it off. It will come off on its own in about one week. Your incision may feel thickened and raised for several weeks after your surgery. This is normal and the skin will soften over time.   For Men Only: It's okay to shave around the incision but do not shave the incision itself for 2 weeks. It is common to have numbness under your chin that could last for several months.  Diet  Resume your normal diet. There are no special food restrictions following this procedure. A low fat/low cholesterol diet is recommended for all patients with vascular disease. In order to heal from your surgery, it is CRITICAL to get adequate nutrition. Your body requires vitamins, minerals, and protein. Vegetables are the best source of  vitamins and minerals. Vegetables also provide the perfect balance of protein. Processed food has little nutritional value, so try to avoid this.  Medications  Resume taking all of your medications unless your doctor or physician assistant tells you not to. If your incision is causing pain, you may take over-the- counter pain relievers such as acetaminophen (Tylenol). If you were prescribed a stronger pain medication, please be aware these medications can cause nausea and constipation. Prevent nausea by taking the medication with a snack or meal. Avoid constipation by drinking plenty of fluids and eating foods with a high amount of fiber, such as fruits, vegetables, and grains.   Do not take Tylenol if you are taking prescription pain medications.  Follow Up  Our office will schedule a follow up appointment 2-3 weeks following discharge.  Please call us immediately for any of the following conditions  . Increased pain, redness, drainage (pus) from your incision site. . Fever of 101 degrees or higher. . If you should develop stroke (slurred speech, difficulty swallowing, weakness on one side of your body, loss of vision) you should call 911 and go to the nearest emergency room. .  Reduce your risk of vascular disease:  . Stop smoking. If you would like help call [facility] at 1-800-QUIT-NOW (1-800-784-8669) or [place] at [phone]. . Manage your cholesterol . Maintain a desired weight . Control your diabetes . Keep your blood pressure down .  If you have any questions, please call the office at [phone]. 

## 2020-12-30 NOTE — Progress Notes (Signed)
   VASCULAR SURGERY ASSESSMENT & PLAN:   POD 1 LEFT CEA: Patient is doing well status post left carotid endarterectomy.  She has ambulated.  She is eating.  VASCULAR QUALITY INITIATIVE: She is on aspirin and is on a statin.  Discharge today.   SUBJECTIVE:   No specific complaints.  PHYSICAL EXAM:   Vitals:   12/29/20 2300 12/30/20 0000 12/30/20 0352 12/30/20 0400  BP: (!) 113/58 102/67 (!) 124/52 (!) 122/59  Pulse:  81 73   Resp: 12 16 16 17   Temp:  98 F (36.7 C) 98 F (36.7 C)   TempSrc:  Oral Oral   SpO2: 91% 93% 94% 95%  Weight:      Height:       NEURO: No focal weakness or paresthesias. Her incision looks fine.  LABS:   Lab Results  Component Value Date   WBC 12.9 (H) 12/30/2020   HGB 14.3 12/30/2020   HCT 43.9 12/30/2020   MCV 94.6 12/30/2020   PLT 196 12/30/2020   Lab Results  Component Value Date   CREATININE 0.84 12/30/2020   Lab Results  Component Value Date   INR 1.0 12/22/2020   CBG (last 3)  Recent Labs    12/29/20 1554 12/29/20 2119 12/30/20 0614  GLUCAP 180* 181* 151*    PROBLEM LIST:    Active Problems:   Carotid artery stenosis   Left carotid artery stenosis   CURRENT MEDS:   . aspirin EC  81 mg Oral QHS  . docusate sodium  100 mg Oral Daily  . empagliflozin  25 mg Oral Daily  . ezetimibe  10 mg Oral q AM  . hydroxychloroquine  200 mg Oral BID  . insulin aspart  0-15 Units Subcutaneous TID WC  . Insulin Degludec-Liraglutide  19 Units Subcutaneous QHS  . lisinopril  2.5 mg Oral QHS  . loratadine  10 mg Oral Daily  . metoprolol succinate  25 mg Oral Daily  . oxybutynin  10 mg Oral q AM  . pantoprazole  40 mg Oral Daily  . rosuvastatin  10 mg Oral Q Debbora Presto Office: [phone] 12/30/2020

## 2020-12-30 NOTE — Progress Notes (Signed)
D/C instructions given to patient. Wound care and medications  Reviewed. IV removed, clean and intact. Stroke education completed. All questions answered. Friend to escort pt home.  Clyde Canterbury, RN

## 2020-12-31 DIAGNOSIS — F329 Major depressive disorder, single episode, unspecified: Secondary | ICD-10-CM | POA: Diagnosis not present

## 2020-12-31 DIAGNOSIS — E119 Type 2 diabetes mellitus without complications: Secondary | ICD-10-CM | POA: Diagnosis not present

## 2020-12-31 DIAGNOSIS — Z48812 Encounter for surgical aftercare following surgery on the circulatory system: Secondary | ICD-10-CM | POA: Diagnosis not present

## 2020-12-31 DIAGNOSIS — Z7984 Long term (current) use of oral hypoglycemic drugs: Secondary | ICD-10-CM | POA: Diagnosis not present

## 2020-12-31 DIAGNOSIS — I6522 Occlusion and stenosis of left carotid artery: Secondary | ICD-10-CM | POA: Diagnosis not present

## 2020-12-31 DIAGNOSIS — Z794 Long term (current) use of insulin: Secondary | ICD-10-CM | POA: Diagnosis not present

## 2020-12-31 DIAGNOSIS — E669 Obesity, unspecified: Secondary | ICD-10-CM | POA: Diagnosis not present

## 2020-12-31 DIAGNOSIS — I25118 Atherosclerotic heart disease of native coronary artery with other forms of angina pectoris: Secondary | ICD-10-CM | POA: Diagnosis not present

## 2020-12-31 DIAGNOSIS — I10 Essential (primary) hypertension: Secondary | ICD-10-CM | POA: Diagnosis not present

## 2021-01-04 DIAGNOSIS — Z794 Long term (current) use of insulin: Secondary | ICD-10-CM | POA: Diagnosis not present

## 2021-01-04 DIAGNOSIS — Z7984 Long term (current) use of oral hypoglycemic drugs: Secondary | ICD-10-CM | POA: Diagnosis not present

## 2021-01-04 DIAGNOSIS — I10 Essential (primary) hypertension: Secondary | ICD-10-CM | POA: Diagnosis not present

## 2021-01-04 DIAGNOSIS — F329 Major depressive disorder, single episode, unspecified: Secondary | ICD-10-CM | POA: Diagnosis not present

## 2021-01-04 DIAGNOSIS — E119 Type 2 diabetes mellitus without complications: Secondary | ICD-10-CM | POA: Diagnosis not present

## 2021-01-04 DIAGNOSIS — E669 Obesity, unspecified: Secondary | ICD-10-CM | POA: Diagnosis not present

## 2021-01-04 DIAGNOSIS — I25118 Atherosclerotic heart disease of native coronary artery with other forms of angina pectoris: Secondary | ICD-10-CM | POA: Diagnosis not present

## 2021-01-04 DIAGNOSIS — Z48812 Encounter for surgical aftercare following surgery on the circulatory system: Secondary | ICD-10-CM | POA: Diagnosis not present

## 2021-01-04 DIAGNOSIS — I6522 Occlusion and stenosis of left carotid artery: Secondary | ICD-10-CM | POA: Diagnosis not present

## 2021-01-13 DIAGNOSIS — E1159 Type 2 diabetes mellitus with other circulatory complications: Secondary | ICD-10-CM | POA: Diagnosis not present

## 2021-01-13 DIAGNOSIS — E669 Obesity, unspecified: Secondary | ICD-10-CM | POA: Diagnosis not present

## 2021-01-13 DIAGNOSIS — I2510 Atherosclerotic heart disease of native coronary artery without angina pectoris: Secondary | ICD-10-CM | POA: Diagnosis not present

## 2021-01-13 DIAGNOSIS — Z794 Long term (current) use of insulin: Secondary | ICD-10-CM | POA: Diagnosis not present

## 2021-01-18 ENCOUNTER — Other Ambulatory Visit: Payer: Self-pay

## 2021-01-18 ENCOUNTER — Ambulatory Visit (INDEPENDENT_AMBULATORY_CARE_PROVIDER_SITE_OTHER): Payer: HMO | Admitting: Pharmacist

## 2021-01-18 VITALS — BP 156/88 | HR 75 | Resp 16 | Ht 61.0 in | Wt 183.4 lb

## 2021-01-18 DIAGNOSIS — I6522 Occlusion and stenosis of left carotid artery: Secondary | ICD-10-CM | POA: Diagnosis not present

## 2021-01-18 DIAGNOSIS — I25118 Atherosclerotic heart disease of native coronary artery with other forms of angina pectoris: Secondary | ICD-10-CM | POA: Diagnosis not present

## 2021-01-18 DIAGNOSIS — E7849 Other hyperlipidemia: Secondary | ICD-10-CM

## 2021-01-18 NOTE — Progress Notes (Signed)
Patient ID: Sandra [name]                 DOB: [birth year]                    MRN: [id number]     HPI: Sandra [name] is a 75 y.o. female patient referred to lipid clinic by Dr Martinique. PMH is significant for CAD s/p left carotid endarterectomy performed 12/29/2020 to reduce risk of stroke,  stent placement in 2017, hypercholesterolemia, tobacco abuse, DM, and hx of DVT. Carotid doppler done in January 2021 showed 60-79% stenosis of left carotid and 40-59% right carotid stenosis. Patient is currently taking rosuvastatin 10mg  once weekly and ezetimibe 10mg  daily. Repatha therapy was initiated on 12/2019, after failure to tolerate rosuvastatin therapy 3x/week. Repatha and Praluent causes flu like symptoms. Patient unwilling to re-challenge with PCSK9i, because she "willing to tolerate any more pain".   Patient somewhat unhappy and agitated after learning she is here for lipid management. She does NOT like statins, and her cholesterol results are "good" and can not understand while she need to try another medication. She has no recollection of me explaining to her the meaning if all her lipid numbers and goal LDL <70.   Current Medications:  Ezetimibe 10mg  daily Rosuvastatin 10mg  weekly  Intolerances:  Rosuvastatin 10mg  daily Rosuvastatin 5mg  daily Simvastatin 20mg  daily Rosuvastatin 10mg  3x weekly - muscle/joint pain Repatha 140mg  every 14 days - back pain and elevated BG Praluent 75mg  every 14 days - flu like aches  LDL goal: 70mg /dL  Diet: weight management with [facility] loss and management (Dr Owens Shark)  Exercise: daily walks   Family History: father MI, leukemia in sister, DM in brother  Social History: alcohol occasional , occasional smoker  Labs: 12/30/2020: CHO 155, TG 122, HDL 48, LDLc 83 07/09/2019: CHO 168; TG 186; HDL 46; LDLc 90   Past Medical History:  Diagnosis Date  . Absence of inferior vena cava 2013  . Arthritis   . Asthma   . Back pain   . Chronic kidney disease    . Congenital absence of inferior vena cava   . Congenital absence of left kidney   . Congenital absence of left ovary   . Coronary artery disease 2004   STATUS POST STENTING OF THE RIGHT CORONARY OSTIUM   . DDD (degenerative disc disease)   . Diabetes mellitus without complication ([place])   . Dislocated shoulder    left  . DVT (deep venous thrombosis) ([place]) 2014   left posterior tibial vein  . Fibromyalgia   . GERD (gastroesophageal reflux disease)   . High blood pressure   . Hypercholesterolemia   . Joint pain   . Osteoarthritis   . Peripheral vascular disease ([place])   . Sleep apnea   . Sleep apnea   . SOB (shortness of breath)   . Tobacco dependence   . Vitamin D deficiency     Current Outpatient Medications on File Prior to Visit  Medication Sig Dispense Refill  . aspirin EC 81 MG tablet Take 1 tablet (81 mg total) by mouth daily. (Patient taking differently: Take 81 mg by mouth at bedtime.) 90 tablet 3  . Azelaic Acid 15 % cream Apply 1 application topically daily.    . Calcium Carb-Cholecalciferol (CALCIUM-VITAMIN D) 600-400 MG-UNIT TABS Take 1 tablet by mouth in the morning and at bedtime.    . cetirizine (ZYRTEC) 10 MG tablet Take 10 mg by mouth daily.    Marland Kitchen  Cinnamon 500 MG capsule Take 2,000 mg by mouth in the morning and at bedtime.    . Coenzyme Q10 200 MG capsule Take 200 mg by mouth at bedtime.    . empagliflozin (JARDIANCE) 25 MG TABS tablet Take 25 mg by mouth daily.    Marland Kitchen ezetimibe (ZETIA) 10 MG tablet Take 1 tablet (10 mg total) by mouth daily. NEEDS OV FOR FUTURE REFILL (Patient taking differently: Take 10 mg by mouth in the morning.) 90 tablet 0  . fluticasone (FLONASE) 50 MCG/ACT nasal spray Place 2 sprays into the nose daily as needed for allergies.    Marland Kitchen HYDROcodone-acetaminophen (NORCO/VICODIN) 5-325 MG tablet Take 1 tablet by mouth every 6 (six) hours as needed for moderate pain. 8 tablet 0  . hydroxychloroquine (PLAQUENIL) 200 MG tablet Take 200 mg by mouth 2  (two) times daily.    Marland Kitchen lisinopril (PRINIVIL,ZESTRIL) 2.5 MG tablet Take 2.5 mg by mouth at bedtime.    . meloxicam (MOBIC) 7.5 MG tablet Take 7.5 mg by mouth in the morning.    . metoprolol succinate (TOPROL-XL) 25 MG 24 hr tablet Take 25 mg by mouth daily.    . metroNIDAZOLE (METROCREAM) 0.75 % cream Apply 1 application topically 2 (two) times daily.    Glory Rosebush VERIO test strip SMARTSIG:90 Via Meter Twice Daily    . oxybutynin (DITROPAN-XL) 10 MG 24 hr tablet Take 10 mg by mouth in the morning.    . rosuvastatin (CRESTOR) 10 MG tablet Take 1 tablet by mouth weekly (Patient taking differently: Take 10 mg by mouth every Tuesday.) 12 tablet 3  . XULTOPHY 100-3.6 UNIT-MG/ML SOPN Inject 19 Units into the skin at bedtime.     No current facility-administered medications on file prior to visit.    Allergies  Allergen Reactions  . Erythromycin   . Praluent [Alirocumab]     myalgias  . Repatha [Evolocumab]     myalgias  . Rosuvastatin Calcium     Other reaction(s): myalgias  . Epinephrine Palpitations  . Metformin And Related Rash    Other hyperlipidemia LDL remains above goal for secondary prevention. Patient is intolerant to multiple statins, and PCSK9i. Still taking rosuvastatin 10mg  weekly plus ezetimibe 10mg  daily.   We discussed LDL goal of less than 70mg /dL. Patient was under the impression that her lipid panel was fine now. I explained reasons to target LDL less than 70 (and ideally less than 55) for someone with carotid stenosis and stent placement.  Patient agreed to try Nexletol 180mg  daily for 2 weeks (samples Lot [id number], exp. Aug/2023 provided). Will contact patient in 1 week to determine if tolerating therapy. Plan to check uric acid and BMP prior to submitting PA for Nexlizet if able to tolerate samples.    Carole Doner Rodriguez-Guzman PharmD, BCPS, [place] [address] [place] [address] 01/19/2021 11:14 AM

## 2021-01-18 NOTE — Patient Instructions (Addendum)
Your Results:             Your most recent labs Goal  Total Cholesterol 155 < 200  Triglycerides 122 < 150  HDL (good cholesterol) 48 > 40  LDL (bad cholesterol) 83 < 70      Medication changes: *Will try Nexletol 180mg  daily samples (with meal) and call back in 1 week to let clinic know if tolerating, then we will send prescription to pharmacy.  *STOP taking rosuvastatin and continue taking ezetimibe while on Nexletol/Nexlizet*  Clinic phone number: [phone] Sandra [name]/Kristin/[place]  Lab orders: *Blood work after 1 week on [employer] and 2 months after if tolerating therapy*   Thank you for choosing [facility]

## 2021-01-19 DIAGNOSIS — R32 Unspecified urinary incontinence: Secondary | ICD-10-CM | POA: Insufficient documentation

## 2021-01-19 DIAGNOSIS — E559 Vitamin D deficiency, unspecified: Secondary | ICD-10-CM | POA: Insufficient documentation

## 2021-01-19 DIAGNOSIS — M25569 Pain in unspecified knee: Secondary | ICD-10-CM | POA: Insufficient documentation

## 2021-01-19 DIAGNOSIS — R0989 Other specified symptoms and signs involving the circulatory and respiratory systems: Secondary | ICD-10-CM | POA: Insufficient documentation

## 2021-01-19 DIAGNOSIS — E1165 Type 2 diabetes mellitus with hyperglycemia: Secondary | ICD-10-CM | POA: Insufficient documentation

## 2021-01-19 DIAGNOSIS — M1990 Unspecified osteoarthritis, unspecified site: Secondary | ICD-10-CM | POA: Insufficient documentation

## 2021-01-19 DIAGNOSIS — J309 Allergic rhinitis, unspecified: Secondary | ICD-10-CM | POA: Insufficient documentation

## 2021-01-19 DIAGNOSIS — I6523 Occlusion and stenosis of bilateral carotid arteries: Secondary | ICD-10-CM | POA: Insufficient documentation

## 2021-01-19 DIAGNOSIS — Z9989 Dependence on other enabling machines and devices: Secondary | ICD-10-CM | POA: Insufficient documentation

## 2021-01-19 DIAGNOSIS — Z720 Tobacco use: Secondary | ICD-10-CM | POA: Insufficient documentation

## 2021-01-19 DIAGNOSIS — F3340 Major depressive disorder, recurrent, in remission, unspecified: Secondary | ICD-10-CM | POA: Insufficient documentation

## 2021-01-19 DIAGNOSIS — M159 Polyosteoarthritis, unspecified: Secondary | ICD-10-CM | POA: Insufficient documentation

## 2021-01-19 DIAGNOSIS — Z860100 Personal history of colon polyps, unspecified: Secondary | ICD-10-CM | POA: Insufficient documentation

## 2021-01-19 DIAGNOSIS — J329 Chronic sinusitis, unspecified: Secondary | ICD-10-CM | POA: Insufficient documentation

## 2021-01-19 DIAGNOSIS — Q605 Renal hypoplasia, unspecified: Secondary | ICD-10-CM | POA: Insufficient documentation

## 2021-01-19 DIAGNOSIS — Z794 Long term (current) use of insulin: Secondary | ICD-10-CM | POA: Insufficient documentation

## 2021-01-19 DIAGNOSIS — Q602 Renal agenesis, unspecified: Secondary | ICD-10-CM | POA: Insufficient documentation

## 2021-01-19 DIAGNOSIS — E2839 Other primary ovarian failure: Secondary | ICD-10-CM | POA: Insufficient documentation

## 2021-01-19 NOTE — Assessment & Plan Note (Addendum)
LDL remains above goal for secondary prevention. Patient is intolerant to multiple statins, and PCSK9i. Still taking rosuvastatin 10mg  weekly plus ezetimibe 10mg  daily.   We discussed LDL goal of less than 70mg /dL. Patient was under the impression that her lipid panel was fine now. I explained reasons to target LDL less than 70 (and ideally less than 55) for someone with carotid stenosis and stent placement.  Patient agreed to try Nexletol 180mg  daily for 2 weeks (samples Lot [id number], exp. Aug/2023 provided). Will contact patient in 1 week to determine if tolerating therapy. Plan to check uric acid and BMP prior to submitting PA for Nexlizet if able to tolerate samples.

## 2021-01-20 ENCOUNTER — Ambulatory Visit (INDEPENDENT_AMBULATORY_CARE_PROVIDER_SITE_OTHER): Payer: HMO | Admitting: Physician Assistant

## 2021-01-20 ENCOUNTER — Other Ambulatory Visit: Payer: Self-pay

## 2021-01-20 VITALS — BP 105/52 | HR 82 | Temp 98.2°F | Resp 20 | Ht 61.0 in | Wt 180.3 lb

## 2021-01-20 DIAGNOSIS — I6522 Occlusion and stenosis of left carotid artery: Secondary | ICD-10-CM

## 2021-01-20 NOTE — Progress Notes (Signed)
POST OPERATIVE OFFICE NOTE    CC:  F/u for surgery  HPI:  This is a 75 y.o. female who is s/p left carotid endarterectomy with bovine pericardial patch angioplasty by Dr. Scot Dock on 12/29/20 for high grade asymptomatic stenosis. She did well post operatively and was discharged on POD #1.  She states she has been doing well since surgery.Some surgical incisional numbness which is improving. Otherwise she denies any new neurological symptoms such as visual changes, slurred speech, facial drooping, weakness or Numbness of upper or lower extremtiies  She remains on Aspirin, Zetia, Nexletol. She does not tolerate Statins. She takes an ACE and BB for hypertension.  Allergies  Allergen Reactions  . Erythromycin   . Praluent [Alirocumab]     myalgias  . Repatha [Evolocumab]     myalgias  . Rosuvastatin Calcium     Other reaction(s): myalgias  . Epinephrine Palpitations  . Metformin And Related Rash    Current Outpatient Medications  Medication Sig Dispense Refill  . aspirin EC 81 MG tablet Take 1 tablet (81 mg total) by mouth daily. (Patient taking differently: Take 81 mg by mouth at bedtime.) 90 tablet 3  . Azelaic Acid 15 % cream Apply 1 application topically daily.    . Bempedoic Acid (NEXLETOL PO) Take by mouth.    . Calcium Carb-Cholecalciferol (CALCIUM-VITAMIN D) 600-400 MG-UNIT TABS Take 1 tablet by mouth in the morning and at bedtime.    . cetirizine (ZYRTEC) 10 MG tablet Take 10 mg by mouth daily.    . Cinnamon 500 MG capsule Take 2,000 mg by mouth in the morning and at bedtime.    . Coenzyme Q10 200 MG capsule Take 200 mg by mouth at bedtime.    . empagliflozin (JARDIANCE) 25 MG TABS tablet Take 25 mg by mouth daily.    Marland Kitchen ezetimibe (ZETIA) 10 MG tablet Take 1 tablet (10 mg total) by mouth daily. NEEDS OV FOR FUTURE REFILL (Patient taking differently: Take 10 mg by mouth in the morning.) 90 tablet 0  . fluticasone (FLONASE) 50 MCG/ACT nasal spray Place 2 sprays into the nose  daily as needed for allergies.    Marland Kitchen HYDROcodone-acetaminophen (NORCO/VICODIN) 5-325 MG tablet Take 1 tablet by mouth every 6 (six) hours as needed for moderate pain. 8 tablet 0  . hydroxychloroquine (PLAQUENIL) 200 MG tablet Take 200 mg by mouth 2 (two) times daily.    Marland Kitchen lisinopril (PRINIVIL,ZESTRIL) 2.5 MG tablet Take 2.5 mg by mouth at bedtime.    . meloxicam (MOBIC) 7.5 MG tablet Take 7.5 mg by mouth in the morning.    . metoprolol succinate (TOPROL-XL) 25 MG 24 hr tablet Take 25 mg by mouth daily.    . metroNIDAZOLE (METROCREAM) 0.75 % cream Apply 1 application topically 2 (two) times daily.    Glory Rosebush VERIO test strip SMARTSIG:90 Via Meter Twice Daily    . oxybutynin (DITROPAN-XL) 10 MG 24 hr tablet Take 10 mg by mouth in the morning.    . rosuvastatin (CRESTOR) 10 MG tablet Take 1 tablet by mouth weekly (Patient taking differently: Take 10 mg by mouth every Tuesday.) 12 tablet 3  . XULTOPHY 100-3.6 UNIT-MG/ML SOPN Inject 19 Units into the skin at bedtime.     No current facility-administered medications for this visit.     ROS:  See HPI  Physical Exam:  Vitals:   01/20/21 1053 01/20/21 1054  BP: 92/63 (!) 105/52  Pulse: 82   Resp: 20   Temp: 98.2 F (36.8  C)   TempSrc: Temporal   SpO2: 97%   Weight: 180 lb 4.8 oz (81.8 kg)   Height: 5\' 1"  (1.549 m)    General: well appearing, well nourished, in no discomfort Incision:  Left neck incision is intact and healing well Extremities: well perfused and warm with palpable radial, dp and Pt pulses bilaterally Neuro: CN intact. Moving extremities without deficits   Assessment/Plan:  This is a 75 y.o. female who is s/p left CEA by Dr. Scot Dock on 12/29/20. She is doing well post op. Incision is healing nicely. She has had no new neurological symptoms.  - She will continue her Aspirin and Zetia. She is planning to start Nexletol for hyperlipidemia in next couple weeks as she did not tolerate statins - She will follow up in 3-4 weeks  for carotid duplex   Karoline Caldwell, PA-C [facility] [phone]  Clinic MD: Scot Dock

## 2021-01-21 ENCOUNTER — Encounter: Payer: Self-pay | Admitting: Pharmacist

## 2021-01-21 ENCOUNTER — Other Ambulatory Visit: Payer: Self-pay

## 2021-01-21 DIAGNOSIS — G4733 Obstructive sleep apnea (adult) (pediatric): Secondary | ICD-10-CM | POA: Diagnosis not present

## 2021-01-21 DIAGNOSIS — I6523 Occlusion and stenosis of bilateral carotid arteries: Secondary | ICD-10-CM

## 2021-01-24 DIAGNOSIS — G4733 Obstructive sleep apnea (adult) (pediatric): Secondary | ICD-10-CM | POA: Diagnosis not present

## 2021-01-25 ENCOUNTER — Telehealth: Payer: Self-pay

## 2021-01-25 DIAGNOSIS — I10 Essential (primary) hypertension: Secondary | ICD-10-CM

## 2021-01-25 NOTE — Telephone Encounter (Signed)
Called the pt who stated that she is infact taking nexletol and will complete bmp and uric acid when possible and the order was placed and will process a pa for nexlizet to combine the nexletol and the zetia to make it easier

## 2021-01-25 NOTE — Telephone Encounter (Signed)
-----   Message from Harrington Challenger, Ava sent at 01/19/2021 11:14 AM EDT ----- Regarding: Nexletol Call patient, check if tolerating nexletol.   Please order BMP and uric acid level if taking nexletol.   Submit PA for Nexlizet as well. (patient currently taking ezetimibe 10mg  daily).

## 2021-01-27 MED ORDER — BEMPEDOIC ACID-EZETIMIBE 180-10 MG PO TABS: 180.0000 mg | ORAL_TABLET | Freq: Every day | ORAL | 3 refills | Status: DC

## 2021-01-27 NOTE — Telephone Encounter (Signed)
Called pt and they stated they are on vacation and might be not be able to get labs for two weeks and I advised them that its ok

## 2021-01-27 NOTE — Telephone Encounter (Signed)
LMOMED PT THEY WERE APPROVED FOR NEXLIZET AND RX SENT INSTRUCTED PT TO FINISH WHAT THEY HAVE AT [place] SWITCHING TO THE COMBO PILL

## 2021-01-27 NOTE — Addendum Note (Signed)
Addended by: Allean Found on: 01/27/2021 09:24 AM   Modules accepted: Orders

## 2021-01-27 NOTE — Telephone Encounter (Signed)
Patient was returning call. Please advise ?

## 2021-02-05 DIAGNOSIS — I10 Essential (primary) hypertension: Secondary | ICD-10-CM | POA: Diagnosis not present

## 2021-02-05 LAB — BASIC METABOLIC PANEL
BUN/Creatinine Ratio: 20 (ref 12–28)
BUN: 22 mg/dL (ref 8–27)
CO2: 24 mmol/L (ref 20–29)
Calcium: 10 mg/dL (ref 8.7–10.3)
Chloride: 104 mmol/L (ref 96–106)
Creatinine, Ser: 1.09 mg/dL — ABNORMAL HIGH (ref 0.57–1.00)
Glucose: 130 mg/dL — ABNORMAL HIGH (ref 65–99)
Potassium: 5 mmol/L (ref 3.5–5.2)
Sodium: 143 mmol/L (ref 134–144)
eGFR: 52 mL/min/{1.73_m2} — ABNORMAL LOW (ref >59)

## 2021-02-05 LAB — URIC ACID: Uric Acid: 5.5 mg/dL (ref 3.1–7.9)

## 2021-02-08 ENCOUNTER — Other Ambulatory Visit: Payer: Self-pay

## 2021-02-08 DIAGNOSIS — L814 Other melanin hyperpigmentation: Secondary | ICD-10-CM | POA: Diagnosis not present

## 2021-02-08 DIAGNOSIS — L821 Other seborrheic keratosis: Secondary | ICD-10-CM | POA: Diagnosis not present

## 2021-02-08 DIAGNOSIS — D225 Melanocytic nevi of trunk: Secondary | ICD-10-CM | POA: Diagnosis not present

## 2021-02-08 DIAGNOSIS — E7849 Other hyperlipidemia: Secondary | ICD-10-CM

## 2021-02-08 DIAGNOSIS — L570 Actinic keratosis: Secondary | ICD-10-CM | POA: Diagnosis not present

## 2021-02-08 DIAGNOSIS — L718 Other rosacea: Secondary | ICD-10-CM | POA: Diagnosis not present

## 2021-02-11 ENCOUNTER — Other Ambulatory Visit: Payer: Self-pay

## 2021-02-11 ENCOUNTER — Ambulatory Visit (HOSPITAL_COMMUNITY)
Admission: RE | Admit: 2021-02-11 | Discharge: 2021-02-11 | Disposition: A | Discharge status: Home / Self Care | Payer: HMO | Source: Ambulatory Visit | Attending: Vascular Surgery | Admitting: Vascular Surgery

## 2021-02-11 ENCOUNTER — Ambulatory Visit (INDEPENDENT_AMBULATORY_CARE_PROVIDER_SITE_OTHER): Payer: HMO | Admitting: Physician Assistant

## 2021-02-11 ENCOUNTER — Encounter: Payer: Self-pay | Admitting: Physician Assistant

## 2021-02-11 VITALS — BP 150/70 | HR 76 | Temp 98.0°F | Resp 20 | Ht 61.0 in | Wt 182.5 lb

## 2021-02-11 DIAGNOSIS — I6523 Occlusion and stenosis of bilateral carotid arteries: Secondary | ICD-10-CM | POA: Diagnosis not present

## 2021-02-11 DIAGNOSIS — F17200 Nicotine dependence, unspecified, uncomplicated: Secondary | ICD-10-CM

## 2021-02-11 MED ORDER — VARENICLINE TARTRATE 0.5 MG PO TABS: 0.5000 mg | ORAL_TABLET | Freq: Two times a day (BID) | ORAL | 0 refills | Status: DC

## 2021-02-11 NOTE — Progress Notes (Signed)
Postoperative Visit   History of Present Illness   Sandra [name] is a 75 y.o. female who presents for postoperative follow-up for: left CEA by Dr. Scot Dock due to high-grade asymptomatic stenosis (Date: 12/29/20).  [name] neck incision is healed.  The patient has not had stroke or TIA symptoms.  She continues to take her aspirin daily.  She continues to smoke however would like a prescription for starter pack of Chantix because she is unable to get to her primary care physician in the next month.  She states her daughter is returning from [place] next month and would like to stop smoking.  Patient states her blood pressure is well controlled at home and is falsely elevated today.  Current Outpatient Medications  Medication Sig Dispense Refill   aspirin EC 81 MG tablet Take 1 tablet (81 mg total) by mouth daily. (Patient taking differently: Take 81 mg by mouth at bedtime.) 90 tablet 3   Azelaic Acid 15 % cream Apply 1 application topically daily.     Calcium Carb-Cholecalciferol (CALCIUM-VITAMIN D) 600-400 MG-UNIT TABS Take 1 tablet by mouth in the morning and at bedtime.     cetirizine (ZYRTEC) 10 MG tablet Take 10 mg by mouth daily.     Cinnamon 500 MG capsule Take 2,000 mg by mouth in the morning and at bedtime.     Coenzyme Q10 200 MG capsule Take 200 mg by mouth at bedtime.     empagliflozin (JARDIANCE) 25 MG TABS tablet Take 25 mg by mouth daily.     fluticasone (FLONASE) 50 MCG/ACT nasal spray Place 2 sprays into the nose daily as needed for allergies.     HYDROcodone-acetaminophen (NORCO/VICODIN) 5-325 MG tablet Take 1 tablet by mouth every 6 (six) hours as needed for moderate pain. 8 tablet 0   hydroxychloroquine (PLAQUENIL) 200 MG tablet Take 200 mg by mouth 2 (two) times daily.     lisinopril (PRINIVIL,ZESTRIL) 2.5 MG tablet Take 2.5 mg by mouth at bedtime.     meloxicam (MOBIC) 7.5 MG tablet Take 7.5 mg by mouth in the morning.     metoprolol succinate (TOPROL-XL) 25 MG 24  hr tablet Take 25 mg by mouth daily.     metroNIDAZOLE (METROCREAM) 0.75 % cream Apply 1 application topically 2 (two) times daily.     ONETOUCH VERIO test strip SMARTSIG:90 Via Meter Twice Daily     oxybutynin (DITROPAN-XL) 10 MG 24 hr tablet Take 10 mg by mouth in the morning.     varenicline (CHANTIX) 0.5 MG tablet Take 1 tablet (0.5 mg total) by mouth 2 (two) times daily. 60 tablet 0   XULTOPHY 100-3.6 UNIT-MG/ML SOPN Inject 19 Units into the skin at bedtime.     No current facility-administered medications for this visit.    For VQI Use Only   PRE-ADM LIVING: Home  AMB STATUS: Ambulatory   Physical Examination   Vitals:   02/11/21 1133 02/11/21 1134  BP: (!) 158/78 (!) 150/70  Pulse: 76   Resp: 20   Temp: 98 F (36.7 C)   TempSrc: Temporal   SpO2: 96%   Weight: 182 lb 8 oz (82.8 kg)   Height: 5\' 1"  (1.549 m)     left Neck: Incision is healed  Neuro: CN 2-12 are grossly intact    Medical Decision Making   Sandra [name] is a 75 y.o. female who presents s/p left CEA.  Patient denies any neurological events since last office visit Carotid duplex demonstrates  a widely patent carotid endarterectomy site of left ICA; right ICA stable at 40 to 59% stenosis Starter pack prescribed for Chantix patient will follow up with PCP for maintenance dosing if needed Recheck carotid duplex in 1 year  Dagoberto Ligas PA-C [facility] Office: [facility] MD: Scot Dock

## 2021-02-19 ENCOUNTER — Telehealth: Payer: Self-pay

## 2021-02-19 NOTE — Telephone Encounter (Signed)
Patient called and left a message concerned about her BP and would like to talk with you.  This morning it was 95 systolic and now at 672 systolic, but she would like to discuss.  Please assist..  Thank you.

## 2021-02-19 NOTE — Telephone Encounter (Signed)
Returned call to patient.  States she has been feeling fine despite BP dropping to < 100 on 2 occasions.  States PCP suggested she reach out to cardiology.  BP increased after a few hours, was back to 116/64.  Assured her that this was fine, no need to d/c or hold any medications.  States there was a 20 point difference between left and right wrists when checked today.  Suggested that she have a 5 minute rest period before checking on each wrist, to get more accurate readings.  She can do that for the next few weeks to determine if truly difference in arms.    Also believes back pain worsening since starting Nexlezet.  She is going to chiropractor on Monday.  Suggested that if she stop the med, then see chiropractor, we wont know which was the cause for decreased back pain.  She will continue Nexlizet for now, maybe try week without in 2 weeks.

## 2021-02-24 DIAGNOSIS — G4733 Obstructive sleep apnea (adult) (pediatric): Secondary | ICD-10-CM | POA: Diagnosis not present

## 2021-02-24 DIAGNOSIS — M79601 Pain in right arm: Secondary | ICD-10-CM | POA: Diagnosis not present

## 2021-02-26 ENCOUNTER — Other Ambulatory Visit: Payer: Self-pay | Admitting: Orthopedic Surgery

## 2021-02-26 DIAGNOSIS — M79631 Pain in right forearm: Secondary | ICD-10-CM

## 2021-02-26 DIAGNOSIS — M7989 Other specified soft tissue disorders: Secondary | ICD-10-CM

## 2021-02-27 ENCOUNTER — Other Ambulatory Visit: Payer: Self-pay | Admitting: Cardiology

## 2021-03-09 ENCOUNTER — Ambulatory Visit: Payer: HMO

## 2021-03-09 ENCOUNTER — Other Ambulatory Visit: Payer: Self-pay

## 2021-03-09 ENCOUNTER — Ambulatory Visit (INDEPENDENT_AMBULATORY_CARE_PROVIDER_SITE_OTHER): Payer: HMO | Admitting: Bariatrics

## 2021-03-09 ENCOUNTER — Encounter (INDEPENDENT_AMBULATORY_CARE_PROVIDER_SITE_OTHER): Payer: Self-pay | Admitting: Bariatrics

## 2021-03-09 VITALS — BP 144/73 | HR 75 | Temp 98.1°F | Ht 63.0 in | Wt 180.0 lb

## 2021-03-09 DIAGNOSIS — Z6834 Body mass index (BMI) 34.0-34.9, adult: Secondary | ICD-10-CM | POA: Diagnosis not present

## 2021-03-09 DIAGNOSIS — F17200 Nicotine dependence, unspecified, uncomplicated: Secondary | ICD-10-CM | POA: Diagnosis not present

## 2021-03-09 DIAGNOSIS — I10 Essential (primary) hypertension: Secondary | ICD-10-CM | POA: Diagnosis not present

## 2021-03-09 DIAGNOSIS — E669 Obesity, unspecified: Secondary | ICD-10-CM | POA: Diagnosis not present

## 2021-03-09 DIAGNOSIS — E1169 Type 2 diabetes mellitus with other specified complication: Secondary | ICD-10-CM

## 2021-03-09 MED ORDER — VARENICLINE TARTRATE 0.5 MG PO TABS: 0.5000 mg | ORAL_TABLET | Freq: Two times a day (BID) | ORAL | Status: DC

## 2021-03-10 NOTE — Progress Notes (Signed)
Chief Complaint:   OBESITY Deonna is here to discuss her progress with her obesity treatment plan along with follow-up of her obesity related diagnoses. Alysa is on the Category 1 Plan and states she is following her eating plan approximately 0% of the time. Cia states she is not exercising at all.  Today's visit was #: 16 Starting weight: 180 lbs Starting date: 05/28/2018 Today's weight: 180 lbs Today's date: 03/09/2021 Total lbs lost to date: 0 Total lbs lost since last in-office visit: 0  Interim History: Lajoyce is up 5 lbs since here last visit. Her last visit was 08/19/2020. She stopped when she had several health issues and is now back to get on track.   Subjective:   1. Diabetes mellitus type 2 in obese [facility]) Langley Gauss is taking Jardiance and Xultophy 19 units. Her fasting blood sugar ranges in the 130's and lower in the mornings.  Lab Results  Component Value Date   HGBA1C 7.4 (H) 04/16/2020   HGBA1C 7.4 (H) 01/07/2020   HGBA1C 7.1 (H) 07/09/2019   Lab Results  Component Value Date   LDLCALC 83 12/30/2020   CREATININE 1.09 (H) 02/05/2021   Lab Results  Component Value Date   INSULIN 8.9 04/16/2020    2. Essential hypertension Lynae's blood pressure is reasonably well control.  BP Readings from Last 3 Encounters:  03/09/21 (!) 144/73  02/11/21 (!) 150/70  01/20/21 (!) 105/52   3. Smoker Calisha has not had a cigarette in 2 days, and she has used Chantix.  Assessment/Plan:   1. Diabetes mellitus type 2 in obese [facility]) Kayln will continue her medications. Good blood sugar control is important to decrease the likelihood of diabetic complications such as nephropathy, neuropathy, limb loss, blindness, coronary artery disease, and death. Intensive lifestyle modification including diet, exercise and weight loss are the first line of treatment for diabetes.   2. Essential hypertension Eretria will continue her medications, and will continue working on healthy  weight loss and exercise to improve blood pressure control. We will watch for signs of hypotension as she continues her lifestyle modifications.   3. Smoker Chayah agreed to start varenicline (CHANTIX) tablet 0.5 mg twice daily with no refills.  4. Obesity, current BMI 31.9 Sairah is currently in the action stage of change. As such, her goal is to continue with weight loss efforts. She has agreed to the Category 1 Plan.   Yaniyah will get back on the plan. Recipes II was given today.  Exercise goals:  Nurse, adult modification strategies: increasing lean protein intake, decreasing simple carbohydrates, increasing vegetables, increasing water intake, decreasing eating out, no skipping meals, meal planning and cooking strategies, keeping healthy foods in the home, and planning for success.  Catalaya has agreed to follow-up with our clinic in 4 weeks. She was informed of the importance of frequent follow-up visits to maximize her success with intensive lifestyle modifications for her multiple health conditions.   Objective:   Blood pressure (!) 144/73, pulse 75, temperature 98.1 F (36.7 C), height 5\' 3"  (1.6 m), weight 180 lb (81.6 kg), SpO2 93 %. Body mass index is 31.89 kg/m.  General: Cooperative, alert, well developed, in no acute distress. HEENT: Conjunctivae and lids unremarkable. Cardiovascular: Regular rhythm.  Lungs: Normal work of breathing. Neurologic: No focal deficits.   Lab Results  Component Value Date   CREATININE 1.09 (H) 02/05/2021   BUN 22 02/05/2021   NA 143 02/05/2021   K 5.0 02/05/2021  CL 104 02/05/2021   CO2 24 02/05/2021   Lab Results  Component Value Date   ALT 26 12/22/2020   AST 26 12/22/2020   ALKPHOS 55 12/22/2020   BILITOT 0.7 12/22/2020   Lab Results  Component Value Date   HGBA1C 7.4 (H) 04/16/2020   HGBA1C 7.4 (H) 01/07/2020   HGBA1C 7.1 (H) 07/09/2019   HGBA1C 6.9 (H) 11/14/2018   Lab Results  Component Value Date    INSULIN 8.9 04/16/2020   Lab Results  Component Value Date   TSH 2.100 07/09/2019   Lab Results  Component Value Date   CHOL 155 12/30/2020   HDL 48 12/30/2020   LDLCALC 83 12/30/2020   TRIG 122 12/30/2020   CHOLHDL 3.2 12/30/2020   Lab Results  Component Value Date   VD25OH 37.9 04/16/2020   VD25OH 59.2 07/09/2019   VD25OH 79.4 11/14/2018   Lab Results  Component Value Date   WBC 12.9 (H) 12/30/2020   HGB 14.3 12/30/2020   HCT 43.9 12/30/2020   MCV 94.6 12/30/2020   PLT 196 12/30/2020   No results found for: IRON, TIBC, FERRITIN  Obesity Behavioral Intervention:   ASK: We discussed the diagnosis of obesity with [name] today and Catherin agreed to give [name] permission to discuss obesity behavioral modification therapy today.  ASSESS: Queena has the diagnosis of obesity and her BMI today is 31.9. Kodie is in the action stage of change.   ADVISE: Zya was educated on the multiple health risks of obesity as well as the benefit of weight loss to improve her health. She was advised of the need for long term treatment and the importance of lifestyle modifications to improve her current health and to decrease her risk of future health problems.  AGREE: Multiple dietary modification options and treatment options were discussed and Deshundra agreed to follow the recommendations documented in the above note.  ARRANGE: Romy was educated on the importance of frequent visits to treat obesity as outlined per CMS and USPSTF guidelines and agreed to schedule her next follow up appointment today.  Attestation Statements:   Reviewed by clinician on day of visit: allergies, medications, problem list, medical history, surgical history, family history, social history, and previous encounter notes.  I, Lizbeth Bark, RMA, am acting as Location manager for [employer], DO.  I have reviewed the above documentation for accuracy and completeness, and I agree with the above. [name],  DO

## 2021-03-11 ENCOUNTER — Encounter (INDEPENDENT_AMBULATORY_CARE_PROVIDER_SITE_OTHER): Payer: Self-pay | Admitting: Bariatrics

## 2021-03-11 DIAGNOSIS — M1990 Unspecified osteoarthritis, unspecified site: Secondary | ICD-10-CM | POA: Diagnosis not present

## 2021-03-11 DIAGNOSIS — E1159 Type 2 diabetes mellitus with other circulatory complications: Secondary | ICD-10-CM | POA: Diagnosis not present

## 2021-03-11 DIAGNOSIS — E782 Mixed hyperlipidemia: Secondary | ICD-10-CM | POA: Diagnosis not present

## 2021-03-11 DIAGNOSIS — M159 Polyosteoarthritis, unspecified: Secondary | ICD-10-CM | POA: Diagnosis not present

## 2021-03-11 DIAGNOSIS — I10 Essential (primary) hypertension: Secondary | ICD-10-CM | POA: Diagnosis not present

## 2021-03-11 DIAGNOSIS — E1165 Type 2 diabetes mellitus with hyperglycemia: Secondary | ICD-10-CM | POA: Diagnosis not present

## 2021-03-11 DIAGNOSIS — F3341 Major depressive disorder, recurrent, in partial remission: Secondary | ICD-10-CM | POA: Diagnosis not present

## 2021-03-11 DIAGNOSIS — I2510 Atherosclerotic heart disease of native coronary artery without angina pectoris: Secondary | ICD-10-CM | POA: Diagnosis not present

## 2021-03-15 ENCOUNTER — Other Ambulatory Visit (INDEPENDENT_AMBULATORY_CARE_PROVIDER_SITE_OTHER): Payer: Self-pay | Admitting: Bariatrics

## 2021-03-15 ENCOUNTER — Telehealth (INDEPENDENT_AMBULATORY_CARE_PROVIDER_SITE_OTHER): Payer: Self-pay

## 2021-03-15 ENCOUNTER — Encounter (INDEPENDENT_AMBULATORY_CARE_PROVIDER_SITE_OTHER): Payer: Self-pay | Admitting: Bariatrics

## 2021-03-15 MED ORDER — VARENICLINE TARTRATE 0.5 MG PO TABS: 0.5000 mg | ORAL_TABLET | Freq: Two times a day (BID) | ORAL | 0 refills | Status: DC

## 2021-03-15 NOTE — Telephone Encounter (Signed)
Pharmacy called states they never received rx for Chantix.  Pt requesting it be resent

## 2021-03-20 ENCOUNTER — Other Ambulatory Visit: Payer: Self-pay

## 2021-03-20 ENCOUNTER — Ambulatory Visit
Admission: RE | Admit: 2021-03-20 | Discharge: 2021-03-20 | Disposition: A | Discharge status: Home / Self Care | Payer: HMO | Source: Ambulatory Visit | Attending: Orthopedic Surgery | Admitting: Orthopedic Surgery

## 2021-03-20 DIAGNOSIS — M7989 Other specified soft tissue disorders: Secondary | ICD-10-CM | POA: Diagnosis not present

## 2021-03-20 DIAGNOSIS — M79631 Pain in right forearm: Secondary | ICD-10-CM

## 2021-03-20 MED ORDER — GADOBENATE DIMEGLUMINE 529 MG/ML IV SOLN
17.0000 mL | Freq: Once | INTRAVENOUS | Status: AC | PRN
  Administered 2021-03-20: 17 mL via INTRAVENOUS

## 2021-03-26 DIAGNOSIS — G4733 Obstructive sleep apnea (adult) (pediatric): Secondary | ICD-10-CM | POA: Diagnosis not present

## 2021-03-30 DIAGNOSIS — M79601 Pain in right arm: Secondary | ICD-10-CM | POA: Diagnosis not present

## 2021-04-01 ENCOUNTER — Other Ambulatory Visit: Payer: Self-pay

## 2021-04-01 ENCOUNTER — Encounter (INDEPENDENT_AMBULATORY_CARE_PROVIDER_SITE_OTHER): Payer: Self-pay | Admitting: Bariatrics

## 2021-04-01 ENCOUNTER — Ambulatory Visit (INDEPENDENT_AMBULATORY_CARE_PROVIDER_SITE_OTHER): Payer: HMO | Admitting: Bariatrics

## 2021-04-01 VITALS — BP 134/85 | HR 77 | Temp 98.2°F | Ht 63.0 in | Wt 181.0 lb

## 2021-04-01 DIAGNOSIS — F17200 Nicotine dependence, unspecified, uncomplicated: Secondary | ICD-10-CM | POA: Diagnosis not present

## 2021-04-01 DIAGNOSIS — E669 Obesity, unspecified: Secondary | ICD-10-CM | POA: Diagnosis not present

## 2021-04-01 DIAGNOSIS — E7849 Other hyperlipidemia: Secondary | ICD-10-CM | POA: Diagnosis not present

## 2021-04-01 DIAGNOSIS — Z6834 Body mass index (BMI) 34.0-34.9, adult: Secondary | ICD-10-CM

## 2021-04-01 MED ORDER — VARENICLINE TARTRATE 0.5 MG PO TABS: 0.5000 mg | ORAL_TABLET | Freq: Two times a day (BID) | ORAL | 0 refills | Status: DC

## 2021-04-05 NOTE — Progress Notes (Signed)
Chief Complaint:   OBESITY Shaylen is here to discuss her progress with her obesity treatment plan along with fothe Category 2 Planllow-up of her obesity related diagnoses. Tahirah is on  and states she is following her eating plan approximately 50% of the time. Danyell states she is doing 0 minutes 0 times per week.  Today's visit was #: 86 Starting weight: 180 lbs Starting date: 05/28/2018 Today's weight: 181 lbs Today's date: 04/01/2021 Total lbs lost to date: 0 Total lbs lost since last in-office visit: 0  Interim History: Sheneika is up 1 lb since her last visit. Her daughter is here with her. She is doing ok with water and protein.  Subjective:   1. Other hyperlipidemia Tresea is not on medication for hyperlipidemia. She has had a series of side effects with the medication.   2. Smoker Oleva is taking her medication as directed.  Assessment/Plan:   1. Other hyperlipidemia Solange will continue to  with work with the plan and increase activities.  Cardiovascular risk and specific lipid/LDL goals reviewed.  We discussed several lifestyle modifications today and Karliah will continue to work on diet, exercise and weight loss efforts. Orders and follow up as documented in patient record.   Counseling Intensive lifestyle modifications are the first line treatment for this issue. Dietary changes: Increase soluble fiber. Decrease simple carbohydrates. Exercise changes: Moderate to vigorous-intensity aerobic activity 150 minutes per week if tolerated. Lipid-lowering medications: see documented in medical record.  2. Smoker We will refill Anakaren's Chantix 0.5 mg by mouth twice daily for 1 month with no refills.  - varenicline (CHANTIX) 0.5 MG tablet; Take 1 tablet (0.5 mg total) by mouth 2 (two) times daily.  Dispense: 60 tablet; Refill: 0  3. Obesity, current BMI 32.1 Valary is currently in the action stage of change. As such, her goal is to continue with weight loss efforts.    Aliena will continue meal plan. She will continue intentional eating. She will have good healthy snacks.  Exercise goals: Increase exercise to about 3 times a week ( weights and cardio ).   Behavioral modification strategies: increasing lean protein intake, decreasing simple carbohydrates, increasing vegetables, increasing water intake, decreasing eating out, no skipping meals, meal planning and cooking strategies, keeping healthy foods in the home, and planning for success.  Jesikah has agreed to follow-up with our clinic in 4 weeks. She was informed of the importance of frequent follow-up visits to maximize her success with intensive lifestyle modifications for her multiple health conditions.   Objective:   Blood pressure 134/85, pulse 77, temperature 98.2 F (36.8 C), height '5\' 3"'$  (1.6 m), weight 181 lb (82.1 kg), SpO2 92 %. Body mass index is 32.06 kg/m.  General: Cooperative, alert, well developed, in no acute distress. HEENT: Conjunctivae and lids unremarkable. Cardiovascular: Regular rhythm.  Lungs: Normal work of breathing. Neurologic: No focal deficits.   Lab Results  Component Value Date   CREATININE 1.09 (H) 02/05/2021   BUN 22 02/05/2021   NA 143 02/05/2021   K 5.0 02/05/2021   CL 104 02/05/2021   CO2 24 02/05/2021   Lab Results  Component Value Date   ALT 26 12/22/2020   AST 26 12/22/2020   ALKPHOS 55 12/22/2020   BILITOT 0.7 12/22/2020   Lab Results  Component Value Date   HGBA1C 7.4 (H) 04/16/2020   HGBA1C 7.4 (H) 01/07/2020   HGBA1C 7.1 (H) 07/09/2019   HGBA1C 6.9 (H) 11/14/2018   Lab Results  Component  Value Date   INSULIN 8.9 04/16/2020   Lab Results  Component Value Date   TSH 2.100 07/09/2019   Lab Results  Component Value Date   CHOL 155 12/30/2020   HDL 48 12/30/2020   LDLCALC 83 12/30/2020   TRIG 122 12/30/2020   CHOLHDL 3.2 12/30/2020   Lab Results  Component Value Date   VD25OH 37.9 04/16/2020   VD25OH 59.2 07/09/2019    VD25OH 79.4 11/14/2018   Lab Results  Component Value Date   WBC 12.9 (H) 12/30/2020   HGB 14.3 12/30/2020   HCT 43.9 12/30/2020   MCV 94.6 12/30/2020   PLT 196 12/30/2020   No results found for: IRON, TIBC, FERRITIN  Obesity Behavioral Intervention:   Approximately 15 minutes were spent on the discussion below.  ASK: We discussed the diagnosis of obesity with [name] today and Jesilyn agreed to give [name] permission to discuss obesity behavioral modification therapy today.  ASSESS: Jahlaya has the diagnosis of obesity and her BMI today is 32.1. Ketzia is in the action stage of change.   ADVISE: Hanne was educated on the multiple health risks of obesity as well as the benefit of weight loss to improve her health. She was advised of the need for long term treatment and the importance of lifestyle modifications to improve her current health and to decrease her risk of future health problems.  AGREE: Multiple dietary modification options and treatment options were discussed and Aamyah agreed to follow the recommendations documented in the above note.  ARRANGE: Shaquayla was educated on the importance of frequent visits to treat obesity as outlined per CMS and USPSTF guidelines and agreed to schedule her next follow up appointment today.  Attestation Statements:   Reviewed by clinician on day of visit: allergies, medications, problem list, medical history, surgical history, family history, social history, and previous encounter notes.  I, Lizbeth Bark, RMA, am acting as Location manager for [employer], DO.   I have reviewed the above documentation for accuracy and completeness, and I agree with the above. [name], DO

## 2021-04-06 ENCOUNTER — Encounter (INDEPENDENT_AMBULATORY_CARE_PROVIDER_SITE_OTHER): Payer: Self-pay | Admitting: Bariatrics

## 2021-04-26 DIAGNOSIS — G4733 Obstructive sleep apnea (adult) (pediatric): Secondary | ICD-10-CM | POA: Diagnosis not present

## 2021-04-29 ENCOUNTER — Other Ambulatory Visit: Payer: Self-pay

## 2021-04-29 ENCOUNTER — Encounter (INDEPENDENT_AMBULATORY_CARE_PROVIDER_SITE_OTHER): Payer: Self-pay | Admitting: Bariatrics

## 2021-04-29 ENCOUNTER — Ambulatory Visit (INDEPENDENT_AMBULATORY_CARE_PROVIDER_SITE_OTHER): Payer: HMO | Admitting: Bariatrics

## 2021-04-29 VITALS — BP 137/73 | HR 72 | Temp 97.6°F | Ht 63.0 in | Wt 180.0 lb

## 2021-04-29 DIAGNOSIS — E1169 Type 2 diabetes mellitus with other specified complication: Secondary | ICD-10-CM

## 2021-04-29 DIAGNOSIS — E669 Obesity, unspecified: Secondary | ICD-10-CM | POA: Diagnosis not present

## 2021-04-29 DIAGNOSIS — Z6834 Body mass index (BMI) 34.0-34.9, adult: Secondary | ICD-10-CM | POA: Diagnosis not present

## 2021-04-29 DIAGNOSIS — F17200 Nicotine dependence, unspecified, uncomplicated: Secondary | ICD-10-CM

## 2021-04-29 DIAGNOSIS — I10 Essential (primary) hypertension: Secondary | ICD-10-CM | POA: Diagnosis not present

## 2021-04-29 MED ORDER — VARENICLINE TARTRATE 0.5 MG PO TABS: 0.5000 mg | ORAL_TABLET | Freq: Two times a day (BID) | ORAL | 0 refills | Status: DC

## 2021-04-29 NOTE — Progress Notes (Signed)
Chief Complaint:   OBESITY Riley is here to discuss her progress with her obesity treatment plan along with follow-up of her obesity related diagnoses. Arliene is on the Category 1 Plan and states she is following her eating plan approximately 80-90% of the time. Berneda states she is doing 0 minutes 0 times per week.  Today's visit was #: 50 Starting weight: 180 lbs Starting date: 05/28/2018 Today's weight: 180 lbs Today's date: 04/29/2021 Total lbs lost to date: 0 Total lbs lost since last in-office visit: 1 lbs  Interim History: Fadwa is down 1 additional 1 lb. She has been careful with making better diet choices. She is getting in her protein, but still not getting enough protein.  Subjective:   1. Essential hypertension Amarrah is currently Zestril and Toprol XL.  2. Diabetes mellitus type 2 in obese [facility]) Jessamine is currently taking [place] and Xultophy.  3. Smoker Havanah is currently taking Chantix. She has not smoked in 2 months.  Assessment/Plan:   1. Essential hypertension Emyah will continue her medications. She is working on healthy weight loss and exercise to improve blood pressure control. We will watch for signs of hypotension as she continues her lifestyle modifications.  2. Diabetes mellitus type 2 in obese [facility]) Hawo will continue her medications. Good blood sugar control is important to decrease the likelihood of diabetic complications such as nephropathy, neuropathy, limb loss, blindness, coronary artery disease, and death. Intensive lifestyle modification including diet, exercise and weight loss are the first line of treatment for diabetes.   3. Smoker We will refill Chantix 0.5 mg by mouth twice daily 60 tablets with no refills.   - varenicline (CHANTIX) 0.5 MG tablet; Take 1 tablet (0.5 mg total) by mouth 2 (two) times daily.  Dispense: 60 tablet; Refill: 0  4. Obesity, current BMI 32 Khylee is currently in the action stage of change. As such,  her goal is to continue with weight loss efforts. She has agreed to the Category 1 Plan and keeping a food journal and adhering to recommended goals of 1000 calories and 70-80 grams of protein.   Jose will stay adherent to the plan (80-90%).  Exercise goals: No exercise has been prescribed at this time.  Behavioral modification strategies: increasing lean protein intake, decreasing simple carbohydrates, increasing vegetables, increasing water intake, decreasing eating out, no skipping meals, meal planning and cooking strategies, keeping healthy foods in the home, and planning for success.  Mellissa has agreed to follow-up with our clinic in 4 weeks. She was informed of the importance of frequent follow-up visits to maximize her success with intensive lifestyle modifications for her multiple health conditions.   Objective:   Blood pressure 137/73, pulse 72, temperature 97.6 F (36.4 C), height '5\' 3"'$  (1.6 m), weight 180 lb (81.6 kg), SpO2 95 %. Body mass index is 31.89 kg/m.  General: Cooperative, alert, well developed, in no acute distress. HEENT: Conjunctivae and lids unremarkable. Cardiovascular: Regular rhythm.  Lungs: Normal work of breathing. Neurologic: No focal deficits.   Lab Results  Component Value Date   CREATININE 1.09 (H) 02/05/2021   BUN 22 02/05/2021   NA 143 02/05/2021   K 5.0 02/05/2021   CL 104 02/05/2021   CO2 24 02/05/2021   Lab Results  Component Value Date   ALT 26 12/22/2020   AST 26 12/22/2020   ALKPHOS 55 12/22/2020   BILITOT 0.7 12/22/2020   Lab Results  Component Value Date   HGBA1C 7.4 (H) 04/16/2020  HGBA1C 7.4 (H) 01/07/2020   HGBA1C 7.1 (H) 07/09/2019   HGBA1C 6.9 (H) 11/14/2018   Lab Results  Component Value Date   INSULIN 8.9 04/16/2020   Lab Results  Component Value Date   TSH 2.100 07/09/2019   Lab Results  Component Value Date   CHOL 155 12/30/2020   HDL 48 12/30/2020   LDLCALC 83 12/30/2020   TRIG 122 12/30/2020    CHOLHDL 3.2 12/30/2020   Lab Results  Component Value Date   VD25OH 37.9 04/16/2020   VD25OH 59.2 07/09/2019   VD25OH 79.4 11/14/2018   Lab Results  Component Value Date   WBC 12.9 (H) 12/30/2020   HGB 14.3 12/30/2020   HCT 43.9 12/30/2020   MCV 94.6 12/30/2020   PLT 196 12/30/2020   No results found for: IRON, TIBC, FERRITIN  Obesity Behavioral Intervention:   Approximately 15 minutes were spent on the discussion below.  ASK: We discussed the diagnosis of obesity with [name] today and Aayliah agreed to give [name] permission to discuss obesity behavioral modification therapy today.  ASSESS: Verania has the diagnosis of obesity and her BMI today is 32.0. Skyler is in the action stage of change.   ADVISE: Madalena was educated on the multiple health risks of obesity as well as the benefit of weight loss to improve her health. She was advised of the need for long term treatment and the importance of lifestyle modifications to improve her current health and to decrease her risk of future health problems.  AGREE: Multiple dietary modification options and treatment options were discussed and Lorell agreed to follow the recommendations documented in the above note.  ARRANGE: Edmund was educated on the importance of frequent visits to treat obesity as outlined per CMS and USPSTF guidelines and agreed to schedule her next follow up appointment today.  Attestation Statements:   Reviewed by clinician on day of visit: allergies, medications, problem list, medical history, surgical history, family history, social history, and previous encounter notes.  I, Lizbeth Bark, RMA, am acting as Location manager for [employer], DO.   I have reviewed the above documentation for accuracy and completeness, and I agree with the above. [name], DO

## 2021-04-30 ENCOUNTER — Encounter (INDEPENDENT_AMBULATORY_CARE_PROVIDER_SITE_OTHER): Payer: Self-pay | Admitting: Bariatrics

## 2021-05-12 DIAGNOSIS — G4733 Obstructive sleep apnea (adult) (pediatric): Secondary | ICD-10-CM | POA: Diagnosis not present

## 2021-05-26 DIAGNOSIS — M25511 Pain in right shoulder: Secondary | ICD-10-CM | POA: Diagnosis not present

## 2021-05-27 ENCOUNTER — Other Ambulatory Visit: Payer: Self-pay

## 2021-05-27 ENCOUNTER — Encounter (INDEPENDENT_AMBULATORY_CARE_PROVIDER_SITE_OTHER): Payer: Self-pay | Admitting: Bariatrics

## 2021-05-27 ENCOUNTER — Ambulatory Visit (INDEPENDENT_AMBULATORY_CARE_PROVIDER_SITE_OTHER): Payer: HMO | Admitting: Bariatrics

## 2021-05-27 VITALS — BP 136/80 | HR 74 | Temp 97.7°F | Ht 63.0 in | Wt 177.0 lb

## 2021-05-27 DIAGNOSIS — E559 Vitamin D deficiency, unspecified: Secondary | ICD-10-CM

## 2021-05-27 DIAGNOSIS — F17200 Nicotine dependence, unspecified, uncomplicated: Secondary | ICD-10-CM

## 2021-05-27 DIAGNOSIS — E669 Obesity, unspecified: Secondary | ICD-10-CM

## 2021-05-27 DIAGNOSIS — E785 Hyperlipidemia, unspecified: Secondary | ICD-10-CM | POA: Diagnosis not present

## 2021-05-27 DIAGNOSIS — Z6834 Body mass index (BMI) 34.0-34.9, adult: Secondary | ICD-10-CM

## 2021-05-27 DIAGNOSIS — G4733 Obstructive sleep apnea (adult) (pediatric): Secondary | ICD-10-CM | POA: Diagnosis not present

## 2021-05-27 DIAGNOSIS — E1169 Type 2 diabetes mellitus with other specified complication: Secondary | ICD-10-CM

## 2021-05-27 NOTE — Progress Notes (Signed)
Chief Complaint:   OBESITY Sandra [name] is here to discuss her progress with her obesity treatment plan along with follow-up of her obesity related diagnoses. Sandra [name] is on the Category 1 Plan and states she is following her eating plan approximately 70-80% of the time. Sandra [name] states she is counting steps 3,000-6,000  5-6 times per week.  Today's visit was #: 25 Starting weight: 180 lbs Starting date: 05/28/2018 Today's weight: 177 lbs Today's date: 05/27/2021 Total lbs lost to date: 3 lbs Total lbs lost since last in-office visit: 3 lbs  Interim History: Sandra [name] is down 3 lbs since her last visit. She had 2 celebrations and still lost weight. She is journaling.  Subjective:   1. Smoker Sandra [name] is taking Chantix. She has not smoked in 3 months.  2. Type 2 diabetes mellitus with other specified complication, without long-term current use of insulin (HCC) Sandra [name] is taking [place]. Her last fasting blood glucose level was 110-130. Her OCC increased to 140.  3. Hyperlipidemia associated with type 2 diabetes mellitus ([place]) Sandra [name] is statin intolerance.   4. Vitamin D deficiency Sandra [name] is not taking Vitamin D.   Assessment/Plan:   1. Smoker [name] will continue Chantix.   2. Type 2 diabetes mellitus with other specified complication, without long-term current use of insulin (HCC) [name] will continue medications. We will check A1C. Good blood sugar control is important to decrease the likelihood of diabetic complications such as nephropathy, neuropathy, limb loss, blindness, coronary artery disease, and death. Intensive lifestyle modification including diet, exercise and weight loss are the first line of treatment for diabetes.   - Lipid Panel With LDL/HDL Ratio - Hemoglobin A1c  3. Hyperlipidemia associated with type 2 diabetes mellitus ([name]) Cardiovascular risk and specific lipid/LDL goals reviewed.  We discussed several lifestyle modifications today and [name] will  continue to work on diet, exercise and weight loss efforts. [name] will continue to follow up with PCP. She will have zero trans fats. She will limit saturated fat. We will check labs today. Orders and follow up as documented in patient record.   Counseling Intensive lifestyle modifications are the first line treatment for this issue. Dietary changes: Increase soluble fiber. Decrease simple carbohydrates. Exercise changes: Moderate to vigorous-intensity aerobic activity 150 minutes per week if tolerated. Lipid-lowering medications: see documented in medical record.  - Lipid Panel With LDL/HDL Ratio - Hemoglobin A1c - Comprehensive metabolic panel  4. Vitamin D deficiency We will check Sandra [name]'s Vitamin D today. Low Vitamin D level contributes to fatigue and are associated with obesity, breast, and colon cancer.   - VITAMIN D 25 Hydroxy (Vit-D Deficiency, Fractures)  5. Obesity, current BMI 31.4 Sandra [name] is currently in the action stage of change. As such, her goal is to continue with weight loss efforts. She has agreed to the Category 1 Plan.   Exercise goals:  As is.  Behavioral modification strategies: increasing lean protein intake, decreasing simple carbohydrates, increasing vegetables, increasing water intake, decreasing eating out, no skipping meals, meal planning and cooking strategies, keeping healthy foods in the home, and planning for success.  Sandra [name] has agreed to follow-up with our clinic in 4 weeks. She was informed of the importance of frequent follow-up visits to maximize her success with intensive lifestyle modifications for her multiple health conditions.   Sandra [name] was informed we would discuss her lab results at her next visit unless there is a critical issue that needs to be addressed sooner. Sandra [name] agreed to keep her next visit  at the agreed upon time to discuss these results.  Objective:   Blood pressure 136/80, pulse 74, temperature 97.7 F (36.5 C), height 5\' 3"  (1.6  m), weight 177 lb (80.3 kg), SpO2 96 %. Body mass index is 31.35 kg/m.  General: Cooperative, alert, well developed, in no acute distress. HEENT: Conjunctivae and lids unremarkable. Cardiovascular: Regular rhythm.  Lungs: Normal work of breathing. Neurologic: No focal deficits.   Lab Results  Component Value Date   CREATININE 1.09 (H) 02/05/2021   BUN 22 02/05/2021   NA 143 02/05/2021   K 5.0 02/05/2021   CL 104 02/05/2021   CO2 24 02/05/2021   Lab Results  Component Value Date   ALT 26 12/22/2020   AST 26 12/22/2020   ALKPHOS 55 12/22/2020   BILITOT 0.7 12/22/2020   Lab Results  Component Value Date   HGBA1C 7.4 (H) 04/16/2020   HGBA1C 7.4 (H) 01/07/2020   HGBA1C 7.1 (H) 07/09/2019   HGBA1C 6.9 (H) 11/14/2018   Lab Results  Component Value Date   INSULIN 8.9 04/16/2020   Lab Results  Component Value Date   TSH 2.100 07/09/2019   Lab Results  Component Value Date   CHOL 155 12/30/2020   HDL 48 12/30/2020   LDLCALC 83 12/30/2020   TRIG 122 12/30/2020   CHOLHDL 3.2 12/30/2020   Lab Results  Component Value Date   VD25OH 37.9 04/16/2020   VD25OH 59.2 07/09/2019   VD25OH 79.4 11/14/2018   Lab Results  Component Value Date   WBC 12.9 (H) 12/30/2020   HGB 14.3 12/30/2020   HCT 43.9 12/30/2020   MCV 94.6 12/30/2020   PLT 196 12/30/2020   No results found for: IRON, TIBC, FERRITIN  Attestation Statements:   Reviewed by clinician on day of visit: allergies, medications, problem list, medical history, surgical history, family history, social history, and previous encounter notes.  I, Lizbeth Bark, RMA, am acting as Location manager for [employer], DO.   I have reviewed the above documentation for accuracy and completeness, and I agree with the above. [name], DO

## 2021-05-28 LAB — COMPREHENSIVE METABOLIC PANEL
ALT: 21 IU/L (ref 0–32)
AST: 26 IU/L (ref 0–40)
Albumin/Globulin Ratio: 1.8 (ref 1.2–2.2)
Albumin: 4.9 g/dL — ABNORMAL HIGH (ref 3.7–4.7)
Alkaline Phosphatase: 76 IU/L (ref 44–121)
BUN/Creatinine Ratio: 22 (ref 12–28)
BUN: 22 mg/dL (ref 8–27)
Bilirubin Total: 0.5 mg/dL (ref 0.0–1.2)
CO2: 19 mmol/L — ABNORMAL LOW (ref 20–29)
Calcium: 10.2 mg/dL (ref 8.7–10.3)
Chloride: 102 mmol/L (ref 96–106)
Creatinine, Ser: 0.98 mg/dL (ref 0.57–1.00)
Globulin, Total: 2.7 g/dL (ref 1.5–4.5)
Glucose: 98 mg/dL (ref 65–99)
Potassium: 4.9 mmol/L (ref 3.5–5.2)
Sodium: 142 mmol/L (ref 134–144)
Total Protein: 7.6 g/dL (ref 6.0–8.5)
eGFR: 59 mL/min/{1.73_m2} — ABNORMAL LOW (ref >59)

## 2021-05-28 LAB — LIPID PANEL WITH LDL/HDL RATIO
Cholesterol, Total: 161 mg/dL (ref 100–199)
HDL: 54 mg/dL (ref >39)
LDL Chol Calc (NIH): 91 mg/dL (ref 0–99)
LDL/HDL Ratio: 1.7 ratio (ref 0.0–3.2)
Triglycerides: 88 mg/dL (ref 0–149)
VLDL Cholesterol Cal: 16 mg/dL (ref 5–40)

## 2021-05-28 LAB — HEMOGLOBIN A1C
Est. average glucose Bld gHb Est-mCnc: 151 mg/dL
Hgb A1c MFr Bld: 6.9 % — ABNORMAL HIGH (ref 4.8–5.6)

## 2021-05-28 LAB — VITAMIN D 25 HYDROXY (VIT D DEFICIENCY, FRACTURES): Vit D, 25-Hydroxy: 53.6 ng/mL (ref 30.0–100.0)

## 2021-05-28 NOTE — Progress Notes (Signed)
Sandra [name] Date of Birth: 09-Feb-1946   History of Present Illness: Sandra [name] is seen for followup CAD. She was last seen in Aug 2017. She has a history of coronary disease and is status post stenting of the ostium of the right coronary in 2004. She has a history of hypercholesterolemia and tobacco abuse. She has DM on insulin. She has an occluded IVC noted incidentally on an MRI and CT scans. 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 She has a history of DVT involving the left posterior tibial vein. This occurred after receiving treatments of injections and laser therapy for varicose veins.    Her last Myoview in in 2017 was normal.   She did have  carotid dopplers in March showing progression of  left ICA stenosis to > 80% , 40-59% RICA stenosis. She had CTA of the neck and follow up with Dr Scot Dock. In April she underwent Left carotid endarterectomy with bovine pericardial patch angioplasty.  On follow up today she is doing well. She quit smoking 3 months ago. Is on Chantix once a day still. Walking 7000 steps a day. Is taking Nexlizet for the most part. No chest pain, TIA, stroke, or claudication.   Current Outpatient Medications on File Prior to Visit  Medication Sig Dispense Refill   aspirin EC 81 MG tablet Take 1 tablet (81 mg total) by mouth daily. (Patient taking differently: Take 81 mg by mouth at bedtime.) 90 tablet 3   Azelaic Acid 15 % cream Apply 1 application topically daily.     Bempedoic Acid-Ezetimibe (NEXLIZET) 180-10 MG TABS Take by mouth.     buPROPion (WELLBUTRIN SR) 150 MG 12 hr tablet TAKE ONE TABLET BY MOUTH ONCE DAILY Diagnosis Unavailable     Calcium Carb-Cholecalciferol (CALCIUM-VITAMIN D) 600-400 MG-UNIT TABS Take 1 tablet by mouth in the morning and at bedtime.     cetirizine (ZYRTEC) 10 MG tablet Take 10 mg by mouth daily.     Cinnamon 500 MG capsule Take 2,000 mg by mouth in the morning and at bedtime.     Coenzyme Q10 200 MG capsule  Take 200 mg by mouth at bedtime.     empagliflozin (JARDIANCE) 25 MG TABS tablet Take 25 mg by mouth daily.     fluticasone (FLONASE) 50 MCG/ACT nasal spray Place 2 sprays into the nose daily as needed for allergies.     HYDROcodone-acetaminophen (NORCO/VICODIN) 5-325 MG tablet Take 1 tablet by mouth every 6 (six) hours as needed for moderate pain. 8 tablet 0   hydroxychloroquine (PLAQUENIL) 200 MG tablet Take 200 mg by mouth 2 (two) times daily.     lisinopril (PRINIVIL,ZESTRIL) 2.5 MG tablet Take 2.5 mg by mouth at bedtime.     meloxicam (MOBIC) 7.5 MG tablet Take 7.5 mg by mouth in the morning.     metoprolol succinate (TOPROL-XL) 25 MG 24 hr tablet Take 25 mg by mouth daily.     metroNIDAZOLE (METROCREAM) 0.75 % cream Apply 1 application topically 2 (two) times daily.     ONETOUCH VERIO test strip SMARTSIG:90 Via Meter Twice Daily     oxybutynin (DITROPAN-XL) 10 MG 24 hr tablet Take 10 mg by mouth in the morning.     varenicline (CHANTIX) 0.5 MG tablet Take 1 tablet (0.5 mg total) by mouth 2 (two) times daily. 60 tablet 0   XULTOPHY 100-3.6 UNIT-MG/ML SOPN Inject 19 Units into the skin at bedtime.     No current facility-administered  medications on file prior to visit.    Allergies  Allergen Reactions   Erythromycin    Praluent [Alirocumab]     myalgias   Repatha [Evolocumab]     myalgias   Rosuvastatin Calcium     Other reaction(s): myalgias   Epinephrine Palpitations   Metformin And Related Rash   Metformin Hcl Rash    Past Medical History:  Diagnosis Date   Absence of inferior vena cava 2013   Arthritis    Asthma    Back pain    Chronic kidney disease    Congenital absence of inferior vena cava    Congenital absence of left kidney    Congenital absence of left ovary    Coronary artery disease 2004   STATUS POST STENTING OF THE RIGHT CORONARY OSTIUM    DDD (degenerative disc disease)    Diabetes mellitus without complication ([place])    Dislocated shoulder    left    DVT (deep venous thrombosis) ([place]) 2014   left posterior tibial vein   Fibromyalgia    GERD (gastroesophageal reflux disease)    High blood pressure    Hypercholesterolemia    Joint pain    Osteoarthritis    Peripheral vascular disease ([place])    Sleep apnea    Sleep apnea    SOB (shortness of breath)    Tobacco dependence    Vitamin D deficiency     Past Surgical History:  Procedure Laterality Date   ANGIOPLASTY  2004   APPENDECTOMY     bone spur shoulder     CATARACT EXTRACTION, BILATERAL     CORONARY ANGIOPLASTY  2004   Dr. [place]   dental implants     DIAGNOSTIC LAPAROSCOPY  1972   iud removal   ENDARTERECTOMY Left 12/29/2020   Procedure: LEFT CAROTID ENDARTERECTOMY;  Surgeon: Angelia Mould, MD;  [facility] OR;  Service: Vascular;  Laterality: Left;   EYE SURGERY Bilateral    cataract removal   FOOT SURGERY     plantar fascitis   IUD REMOVAL     KNEE SURGERY  1950   TONSILLECTOMY  1955   TOTAL ABDOMINAL HYSTERECTOMY  1981   TUBAL LIGATION  1975   Dr. Franklin     2014    Social History   Tobacco Use  Smoking Status Some Days   Packs/day: 0.25   Types: Cigarettes  Smokeless Tobacco Never    Social History   Substance and Sexual Activity  Alcohol Use Yes   Alcohol/week: 0.0 standard drinks   Comment: occasionally    Family History  Problem Relation Age of Onset   Leukemia Sister    Hypertension Brother    Heart disease Father        before age 76   High blood pressure Father    Sudden death Father    Breast cancer Neg Hx     Review of Systems: As noted in history of present illness.  All other systems were reviewed and are negative.  Physical Exam: BP 108/60 (BP Location: Left Arm, Patient Position: Sitting, Cuff Size: Normal)   Pulse 69   Ht 5\' 3"  (1.6 m)   Wt 180 lb 12.8 oz (82 kg)   SpO2 96%   BMI 32.03 kg/m  GENERAL:  Well appearing obese WF in NAD HEENT:  PERRL, EOMI, sclera are clear. Oropharynx is  clear. NECK:  No jugular venous distention, carotid upstroke brisk and symmetric, left CEA scar.  loud right carotid bruit. no thyromegaly or adenopathy LUNGS:  Clear to auscultation bilaterally CHEST:  Unremarkable HEART:  RRR,  PMI not displaced or sustained,S1 and S2 within normal limits, no S3, no S4: no clicks, no rubs, no murmurs ABD:  Soft, nontender. BS +, no masses or bruits. No hepatomegaly, no splenomegaly EXT:  2 + pulses throughout, no edema, no cyanosis no clubbing SKIN:  Warm and dry.  No rashes NEURO:  Alert and oriented x 3. Cranial nerves II through XII intact. PSYCH:  Cognitively intact    LABORATORY DATA:  Lab Results  Component Value Date   WBC 12.9 (H) 12/30/2020   HGB 14.3 12/30/2020   HCT 43.9 12/30/2020   PLT 196 12/30/2020   GLUCOSE 98 05/27/2021   CHOL 161 05/27/2021   TRIG 88 05/27/2021   HDL 54 05/27/2021   LDLCALC 91 05/27/2021   ALT 21 05/27/2021   AST 26 05/27/2021   NA 142 05/27/2021   K 4.9 05/27/2021   CL 102 05/27/2021   CREATININE 0.98 05/27/2021   BUN 22 05/27/2021   CO2 19 (L) 05/27/2021   TSH 2.100 07/09/2019   INR 1.0 12/22/2020   HGBA1C 6.9 (H) 05/27/2021     Labs reviewed from primary care September 2016. Normal CMET. Cholesterol 135, trig- 247, HDL 31, LDL 55.  Dated 07/11/18: cholesterol 150, triglycerides 136, HDL 38, LDL 85.  Dated 08/17/18: A1c 6.7%. CBC and chemistries normal.  Dated 8/112/21: cholesterol 187, triglycerides 142, HDL 48, LDL 114.  Dated 11/04/20: A1c 7.2%. creatinine 1.01.   Myoview 05/17/16: Study Highlights     The left ventricular ejection fraction is hyperdynamic (>65%). Nuclear stress EF: 68%. There was no ST segment deviation noted during stress. The study is normal. This is a low risk study.   Normal resting and stress perfusion. No ischemia or infarction EF 68%     Assessment / Plan: 1. Coronary disease with remote stenting of the ostium of the right coronary. Last Myoview in 2017 was  normal.  Continue  ASA  81 mg daily. Continue Toprol. Needs aggressive risk factor modification. She is asymptomatic   2. Tobacco dependence. I've encouraged her efforts to quit smoking   3. Left carotid stenosis. Now severe- progressive. Asymptomatic. S/p left  CEA  4. Occluded IVC. Congenital.  5. HTN controlled. Continue therapy  6. Dyslipidemia. Now on Nexlizet. LDL still is not at goal < 70. Intolerant of statins and PCSK 9. If unable to stay on nexlizet would recommend  inclisaren.    7. DM type 2 on insulin. Per primary care. Last A1c improved to 6.9%.   8. Morbid obesity. Encouraged efforts at weight loss program.   Follow up in 6 months.

## 2021-05-31 ENCOUNTER — Other Ambulatory Visit: Payer: Self-pay

## 2021-05-31 ENCOUNTER — Ambulatory Visit (INDEPENDENT_AMBULATORY_CARE_PROVIDER_SITE_OTHER): Payer: HMO | Admitting: Cardiology

## 2021-05-31 ENCOUNTER — Encounter: Payer: Self-pay | Admitting: Cardiology

## 2021-05-31 VITALS — BP 108/60 | HR 69 | Ht 63.0 in | Wt 180.8 lb

## 2021-05-31 DIAGNOSIS — Z6832 Body mass index (BMI) 32.0-32.9, adult: Secondary | ICD-10-CM | POA: Diagnosis not present

## 2021-05-31 DIAGNOSIS — I10 Essential (primary) hypertension: Secondary | ICD-10-CM | POA: Diagnosis not present

## 2021-05-31 DIAGNOSIS — Z794 Long term (current) use of insulin: Secondary | ICD-10-CM

## 2021-05-31 DIAGNOSIS — E669 Obesity, unspecified: Secondary | ICD-10-CM | POA: Diagnosis not present

## 2021-05-31 DIAGNOSIS — M154 Erosive (osteo)arthritis: Secondary | ICD-10-CM | POA: Diagnosis not present

## 2021-05-31 DIAGNOSIS — E7800 Pure hypercholesterolemia, unspecified: Secondary | ICD-10-CM

## 2021-05-31 DIAGNOSIS — I25118 Atherosclerotic heart disease of native coronary artery with other forms of angina pectoris: Secondary | ICD-10-CM

## 2021-05-31 DIAGNOSIS — M150 Primary generalized (osteo)arthritis: Secondary | ICD-10-CM | POA: Diagnosis not present

## 2021-05-31 DIAGNOSIS — M791 Myalgia, unspecified site: Secondary | ICD-10-CM | POA: Diagnosis not present

## 2021-05-31 DIAGNOSIS — G894 Chronic pain syndrome: Secondary | ICD-10-CM | POA: Diagnosis not present

## 2021-05-31 DIAGNOSIS — I6522 Occlusion and stenosis of left carotid artery: Secondary | ICD-10-CM | POA: Diagnosis not present

## 2021-05-31 DIAGNOSIS — E119 Type 2 diabetes mellitus without complications: Secondary | ICD-10-CM

## 2021-06-01 DIAGNOSIS — H43823 Vitreomacular adhesion, bilateral: Secondary | ICD-10-CM | POA: Diagnosis not present

## 2021-06-01 DIAGNOSIS — Z79899 Other long term (current) drug therapy: Secondary | ICD-10-CM | POA: Diagnosis not present

## 2021-06-01 DIAGNOSIS — E119 Type 2 diabetes mellitus without complications: Secondary | ICD-10-CM | POA: Diagnosis not present

## 2021-06-01 DIAGNOSIS — H524 Presbyopia: Secondary | ICD-10-CM | POA: Diagnosis not present

## 2021-06-03 DIAGNOSIS — E782 Mixed hyperlipidemia: Secondary | ICD-10-CM | POA: Diagnosis not present

## 2021-06-03 DIAGNOSIS — E1165 Type 2 diabetes mellitus with hyperglycemia: Secondary | ICD-10-CM | POA: Diagnosis not present

## 2021-06-03 DIAGNOSIS — E1159 Type 2 diabetes mellitus with other circulatory complications: Secondary | ICD-10-CM | POA: Diagnosis not present

## 2021-06-03 DIAGNOSIS — I2510 Atherosclerotic heart disease of native coronary artery without angina pectoris: Secondary | ICD-10-CM | POA: Diagnosis not present

## 2021-06-03 DIAGNOSIS — I10 Essential (primary) hypertension: Secondary | ICD-10-CM | POA: Diagnosis not present

## 2021-06-03 DIAGNOSIS — M159 Polyosteoarthritis, unspecified: Secondary | ICD-10-CM | POA: Diagnosis not present

## 2021-06-03 DIAGNOSIS — F3341 Major depressive disorder, recurrent, in partial remission: Secondary | ICD-10-CM | POA: Diagnosis not present

## 2021-06-16 ENCOUNTER — Encounter (INDEPENDENT_AMBULATORY_CARE_PROVIDER_SITE_OTHER): Payer: Self-pay

## 2021-06-16 ENCOUNTER — Other Ambulatory Visit (INDEPENDENT_AMBULATORY_CARE_PROVIDER_SITE_OTHER): Payer: Self-pay | Admitting: Bariatrics

## 2021-06-16 DIAGNOSIS — F17200 Nicotine dependence, unspecified, uncomplicated: Secondary | ICD-10-CM

## 2021-06-16 NOTE — Telephone Encounter (Signed)
Message sent to pt-CAS 

## 2021-06-23 ENCOUNTER — Ambulatory Visit (INDEPENDENT_AMBULATORY_CARE_PROVIDER_SITE_OTHER): Payer: HMO | Admitting: Bariatrics

## 2021-06-28 DIAGNOSIS — M25511 Pain in right shoulder: Secondary | ICD-10-CM | POA: Diagnosis not present

## 2021-07-07 ENCOUNTER — Encounter (INDEPENDENT_AMBULATORY_CARE_PROVIDER_SITE_OTHER): Payer: Self-pay | Admitting: Bariatrics

## 2021-07-07 ENCOUNTER — Ambulatory Visit (INDEPENDENT_AMBULATORY_CARE_PROVIDER_SITE_OTHER): Payer: HMO | Admitting: Bariatrics

## 2021-07-07 ENCOUNTER — Other Ambulatory Visit: Payer: Self-pay

## 2021-07-07 VITALS — BP 146/80 | HR 73 | Temp 97.5°F | Ht 63.0 in | Wt 179.0 lb

## 2021-07-07 DIAGNOSIS — F17200 Nicotine dependence, unspecified, uncomplicated: Secondary | ICD-10-CM

## 2021-07-07 DIAGNOSIS — E669 Obesity, unspecified: Secondary | ICD-10-CM

## 2021-07-07 DIAGNOSIS — Z6831 Body mass index (BMI) 31.0-31.9, adult: Secondary | ICD-10-CM

## 2021-07-07 DIAGNOSIS — I10 Essential (primary) hypertension: Secondary | ICD-10-CM

## 2021-07-07 MED ORDER — VARENICLINE TARTRATE 0.5 MG PO TABS: 0.5000 mg | ORAL_TABLET | Freq: Two times a day (BID) | ORAL | 0 refills | Status: DC

## 2021-07-07 NOTE — Progress Notes (Signed)
Chief Complaint:   OBESITY Sandra [name] is here to discuss her progress with her obesity treatment plan along with follow-up of her obesity related diagnoses. Sandra [name] is on the Category 1 Plan and states she is following her eating plan approximately 60% of the time. Sandra [name] states she is counting steps walking 5000  6 times per week.  Today's visit was #: 57 Starting weight: 180 lbs Starting date: 05/28/2018 Today's weight: 179 lbs Today's date: 07/07/2021 Total lbs lost to date: 1 lb Total lbs lost since last in-office visit: 0  Interim History: Revella is up 2 lbs since her last visit.   Subjective:   1. Smoker Sandra [name] is currently taking Chantix.  2. Essential hypertension Sandra [name]'s blood pressure is controlled.  Assessment/Plan:   1. Smoker We will refill Chantix 0.5 mg for 1 month with no refills.  - varenicline (CHANTIX) 0.5 MG tablet; Take 1 tablet (0.5 mg total) by mouth 2 (two) times daily.  Dispense: 60 tablet; Refill: 0  2. Essential hypertension Sandra [name] will continue her medications. She is working on healthy weight loss and exercise to improve blood pressure control. We will watch for signs of hypotension as she continues her lifestyle modifications.  3. Class 1 obesity with serious comorbidity and body mass index (BMI) of 31.0 to 31.9 in adult, unspecified obesity type Sandra [name] is currently in the action stage of change. As such, her goal is to continue with weight loss efforts. She has agreed to the Category 1 Plan and keeping a food journal and adhering to recommended goals of 1000-1100 calories and 70-80 grams of protein.   Sandra [name] will continue meal planning and intentional eating. We reviewed labs today.  Exercise goals:  Sandra [name] will get back to the gym.  Behavioral modification strategies: increasing lean protein intake, decreasing simple carbohydrates, increasing vegetables, increasing water intake, decreasing eating out, no skipping meals, meal planning and  cooking strategies, keeping healthy foods in the home, and planning for success.  Sandra [name] has agreed to follow-up with our clinic in 4 weeks. She was informed of the importance of frequent follow-up visits to maximize her success with intensive lifestyle modifications for her multiple health conditions.   Objective:   Blood pressure (!) 146/80, pulse 73, temperature (!) 97.5 F (36.4 C), height 5\' 3"  (1.6 m), weight 179 lb (81.2 kg), SpO2 97 %. Body mass index is 31.71 kg/m.  General: Cooperative, alert, well developed, in no acute distress. HEENT: Conjunctivae and lids unremarkable. Cardiovascular: Regular rhythm.  Lungs: Normal work of breathing. Neurologic: No focal deficits.   Lab Results  Component Value Date   CREATININE 0.98 05/27/2021   BUN 22 05/27/2021   NA 142 05/27/2021   K 4.9 05/27/2021   CL 102 05/27/2021   CO2 19 (L) 05/27/2021   Lab Results  Component Value Date   ALT 21 05/27/2021   AST 26 05/27/2021   ALKPHOS 76 05/27/2021   BILITOT 0.5 05/27/2021   Lab Results  Component Value Date   HGBA1C 6.9 (H) 05/27/2021   HGBA1C 7.4 (H) 04/16/2020   HGBA1C 7.4 (H) 01/07/2020   HGBA1C 7.1 (H) 07/09/2019   HGBA1C 6.9 (H) 11/14/2018   Lab Results  Component Value Date   INSULIN 8.9 04/16/2020   Lab Results  Component Value Date   TSH 2.100 07/09/2019   Lab Results  Component Value Date   CHOL 161 05/27/2021   HDL 54 05/27/2021   LDLCALC 91 05/27/2021   TRIG 88 05/27/2021   CHOLHDL  3.2 12/30/2020   Lab Results  Component Value Date   VD25OH 53.6 05/27/2021   VD25OH 37.9 04/16/2020   VD25OH 59.2 07/09/2019   Lab Results  Component Value Date   WBC 12.9 (H) 12/30/2020   HGB 14.3 12/30/2020   HCT 43.9 12/30/2020   MCV 94.6 12/30/2020   PLT 196 12/30/2020   No results found for: IRON, TIBC, FERRITIN  Attestation Statements:   Reviewed by clinician on day of visit: allergies, medications, problem list, medical history, surgical history, family  history, social history, and previous encounter notes.  I, Lizbeth Bark, RMA, am acting as Location manager for [employer], DO.   I have reviewed the above documentation for accuracy and completeness, and I agree with the above. [name], DO

## 2021-07-16 DIAGNOSIS — E669 Obesity, unspecified: Secondary | ICD-10-CM | POA: Diagnosis not present

## 2021-07-16 DIAGNOSIS — E1159 Type 2 diabetes mellitus with other circulatory complications: Secondary | ICD-10-CM | POA: Diagnosis not present

## 2021-07-16 DIAGNOSIS — Z794 Long term (current) use of insulin: Secondary | ICD-10-CM | POA: Diagnosis not present

## 2021-07-16 DIAGNOSIS — Z889 Allergy status to unspecified drugs, medicaments and biological substances status: Secondary | ICD-10-CM | POA: Diagnosis not present

## 2021-07-16 DIAGNOSIS — I2510 Atherosclerotic heart disease of native coronary artery without angina pectoris: Secondary | ICD-10-CM | POA: Diagnosis not present

## 2021-07-28 DIAGNOSIS — M25511 Pain in right shoulder: Secondary | ICD-10-CM | POA: Diagnosis not present

## 2021-08-16 DIAGNOSIS — I2510 Atherosclerotic heart disease of native coronary artery without angina pectoris: Secondary | ICD-10-CM | POA: Diagnosis not present

## 2021-08-16 DIAGNOSIS — R32 Unspecified urinary incontinence: Secondary | ICD-10-CM | POA: Diagnosis not present

## 2021-08-16 DIAGNOSIS — I10 Essential (primary) hypertension: Secondary | ICD-10-CM | POA: Diagnosis not present

## 2021-08-16 DIAGNOSIS — F17200 Nicotine dependence, unspecified, uncomplicated: Secondary | ICD-10-CM | POA: Diagnosis not present

## 2021-08-16 DIAGNOSIS — J309 Allergic rhinitis, unspecified: Secondary | ICD-10-CM | POA: Diagnosis not present

## 2021-08-16 DIAGNOSIS — E782 Mixed hyperlipidemia: Secondary | ICD-10-CM | POA: Diagnosis not present

## 2021-08-16 DIAGNOSIS — M549 Dorsalgia, unspecified: Secondary | ICD-10-CM | POA: Diagnosis not present

## 2021-08-16 DIAGNOSIS — Z1389 Encounter for screening for other disorder: Secondary | ICD-10-CM | POA: Diagnosis not present

## 2021-08-16 DIAGNOSIS — M159 Polyosteoarthritis, unspecified: Secondary | ICD-10-CM | POA: Diagnosis not present

## 2021-08-16 DIAGNOSIS — E669 Obesity, unspecified: Secondary | ICD-10-CM | POA: Diagnosis not present

## 2021-08-16 DIAGNOSIS — Z0000 Encounter for general adult medical examination without abnormal findings: Secondary | ICD-10-CM | POA: Diagnosis not present

## 2021-08-16 DIAGNOSIS — E1159 Type 2 diabetes mellitus with other circulatory complications: Secondary | ICD-10-CM | POA: Diagnosis not present

## 2021-08-23 ENCOUNTER — Ambulatory Visit (INDEPENDENT_AMBULATORY_CARE_PROVIDER_SITE_OTHER): Payer: HMO | Admitting: Bariatrics

## 2021-08-31 ENCOUNTER — Other Ambulatory Visit (INDEPENDENT_AMBULATORY_CARE_PROVIDER_SITE_OTHER): Payer: Self-pay | Admitting: Bariatrics

## 2021-08-31 DIAGNOSIS — F17200 Nicotine dependence, unspecified, uncomplicated: Secondary | ICD-10-CM

## 2021-09-01 DIAGNOSIS — E1165 Type 2 diabetes mellitus with hyperglycemia: Secondary | ICD-10-CM | POA: Diagnosis not present

## 2021-09-01 DIAGNOSIS — M159 Polyosteoarthritis, unspecified: Secondary | ICD-10-CM | POA: Diagnosis not present

## 2021-09-01 DIAGNOSIS — E782 Mixed hyperlipidemia: Secondary | ICD-10-CM | POA: Diagnosis not present

## 2021-09-01 DIAGNOSIS — I10 Essential (primary) hypertension: Secondary | ICD-10-CM | POA: Diagnosis not present

## 2021-09-01 DIAGNOSIS — E1159 Type 2 diabetes mellitus with other circulatory complications: Secondary | ICD-10-CM | POA: Diagnosis not present

## 2021-09-01 DIAGNOSIS — G4733 Obstructive sleep apnea (adult) (pediatric): Secondary | ICD-10-CM | POA: Diagnosis not present

## 2021-09-01 DIAGNOSIS — I2510 Atherosclerotic heart disease of native coronary artery without angina pectoris: Secondary | ICD-10-CM | POA: Diagnosis not present

## 2021-09-14 DIAGNOSIS — U071 COVID-19: Secondary | ICD-10-CM | POA: Diagnosis not present

## 2021-09-17 DIAGNOSIS — G4733 Obstructive sleep apnea (adult) (pediatric): Secondary | ICD-10-CM | POA: Diagnosis not present

## 2021-10-04 DIAGNOSIS — E1165 Type 2 diabetes mellitus with hyperglycemia: Secondary | ICD-10-CM | POA: Diagnosis not present

## 2021-10-04 DIAGNOSIS — I2510 Atherosclerotic heart disease of native coronary artery without angina pectoris: Secondary | ICD-10-CM | POA: Diagnosis not present

## 2021-10-04 DIAGNOSIS — E782 Mixed hyperlipidemia: Secondary | ICD-10-CM | POA: Diagnosis not present

## 2021-10-04 DIAGNOSIS — M159 Polyosteoarthritis, unspecified: Secondary | ICD-10-CM | POA: Diagnosis not present

## 2021-10-04 DIAGNOSIS — E1159 Type 2 diabetes mellitus with other circulatory complications: Secondary | ICD-10-CM | POA: Diagnosis not present

## 2021-10-04 DIAGNOSIS — I10 Essential (primary) hypertension: Secondary | ICD-10-CM | POA: Diagnosis not present

## 2021-11-07 DIAGNOSIS — K2900 Acute gastritis without bleeding: Secondary | ICD-10-CM | POA: Diagnosis not present

## 2021-11-16 ENCOUNTER — Other Ambulatory Visit: Payer: Self-pay | Admitting: Family Medicine

## 2021-11-16 DIAGNOSIS — Z1231 Encounter for screening mammogram for malignant neoplasm of breast: Secondary | ICD-10-CM

## 2021-11-18 DIAGNOSIS — I2510 Atherosclerotic heart disease of native coronary artery without angina pectoris: Secondary | ICD-10-CM | POA: Diagnosis not present

## 2021-11-18 DIAGNOSIS — E1159 Type 2 diabetes mellitus with other circulatory complications: Secondary | ICD-10-CM | POA: Diagnosis not present

## 2021-11-18 DIAGNOSIS — G720 Drug-induced myopathy: Secondary | ICD-10-CM | POA: Diagnosis not present

## 2021-11-18 DIAGNOSIS — E669 Obesity, unspecified: Secondary | ICD-10-CM | POA: Diagnosis not present

## 2021-11-18 DIAGNOSIS — Z794 Long term (current) use of insulin: Secondary | ICD-10-CM | POA: Diagnosis not present

## 2021-11-26 ENCOUNTER — Other Ambulatory Visit: Payer: Self-pay | Admitting: Cardiology

## 2021-11-29 DIAGNOSIS — E1159 Type 2 diabetes mellitus with other circulatory complications: Secondary | ICD-10-CM | POA: Diagnosis not present

## 2021-11-29 DIAGNOSIS — E782 Mixed hyperlipidemia: Secondary | ICD-10-CM | POA: Diagnosis not present

## 2021-11-29 DIAGNOSIS — I10 Essential (primary) hypertension: Secondary | ICD-10-CM | POA: Diagnosis not present

## 2021-12-02 DIAGNOSIS — E1159 Type 2 diabetes mellitus with other circulatory complications: Secondary | ICD-10-CM | POA: Diagnosis not present

## 2021-12-20 DIAGNOSIS — M159 Polyosteoarthritis, unspecified: Secondary | ICD-10-CM | POA: Diagnosis not present

## 2021-12-20 DIAGNOSIS — Z79899 Other long term (current) drug therapy: Secondary | ICD-10-CM | POA: Diagnosis not present

## 2021-12-29 ENCOUNTER — Ambulatory Visit
Admission: RE | Admit: 2021-12-29 | Discharge: 2021-12-29 | Disposition: A | Discharge status: Home / Self Care | Payer: HMO | Source: Ambulatory Visit | Attending: Family Medicine | Admitting: Family Medicine

## 2021-12-29 DIAGNOSIS — Z1231 Encounter for screening mammogram for malignant neoplasm of breast: Secondary | ICD-10-CM | POA: Diagnosis not present

## 2022-02-02 NOTE — Progress Notes (Signed)
Sandra [name] Date of Birth: 1946/06/05   History of Present Illness: Sandra [name] is seen for followup CAD. She was last seen in Aug 2017. She has a history of coronary disease and is status post stenting of the ostium of the right coronary in 2004. She has a history of hypercholesterolemia and tobacco abuse. She has DM on insulin. She has an occluded IVC noted incidentally on an MRI and CT scans. 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 She has a history of DVT involving the left posterior tibial vein. This occurred after receiving treatments of injections and laser therapy for varicose veins.    Her last Myoview in in 2017 was normal.   She did have  carotid dopplers in March showing progression of  left ICA stenosis to > 80% , 40-59% RICA stenosis. She had CTA of the neck and follow up with Dr Scot Dock. In April she underwent Left carotid endarterectomy with bovine pericardial patch angioplasty.  On follow up today she is doing well. She has quit smoking.  Walking 7000 steps a day. She was on Nexlizet but unable to afford it. No chest pain, TIA, stroke, or claudication.   Current Outpatient Medications on File Prior to Visit  Medication Sig Dispense Refill   amitriptyline (ELAVIL) 25 MG tablet Take 2 tablets by mouth daily.     aspirin EC 81 MG tablet Take 1 tablet (81 mg total) by mouth daily. (Patient taking differently: Take 81 mg by mouth at bedtime.) 90 tablet 3   Azelaic Acid 15 % cream Apply 1 application topically daily.     Calcium Carb-Cholecalciferol (CALCIUM-VITAMIN D) 600-400 MG-UNIT TABS Take 1 tablet by mouth in the morning and at bedtime.     cetirizine (ZYRTEC) 10 MG tablet Take 10 mg by mouth daily.     Cinnamon 500 MG capsule Take 2,000 mg by mouth in the morning and at bedtime.     Coenzyme Q10 200 MG capsule Take 200 mg by mouth at bedtime.     empagliflozin (JARDIANCE) 25 MG TABS tablet Take 25 mg by mouth daily.     fluticasone (FLONASE) 50  MCG/ACT nasal spray Place 2 sprays into the nose daily as needed for allergies.     Krill Oil (OMEGA-3) 500 MG CAPS See admin instructions.     lisinopril (PRINIVIL,ZESTRIL) 2.5 MG tablet Take 2.5 mg by mouth at bedtime.     Magnesium 250 MG TABS 1 tablet with a meal     meloxicam (MOBIC) 7.5 MG tablet Take 7.5 mg by mouth in the morning.     metoprolol succinate (TOPROL-XL) 25 MG 24 hr tablet Take 25 mg by mouth daily.     metroNIDAZOLE (METROCREAM) 0.75 % cream Apply 1 application topically 2 (two) times daily.     ONETOUCH VERIO test strip SMARTSIG:90 Via Meter Twice Daily     oxybutynin (DITROPAN-XL) 10 MG 24 hr tablet Take 10 mg by mouth in the morning.     XULTOPHY 100-3.6 UNIT-MG/ML SOPN Inject 19 Units into the skin at bedtime.     No current facility-administered medications on file prior to visit.    Allergies  Allergen Reactions   Erythromycin    Praluent [Alirocumab]     myalgias   Repatha [Evolocumab]     myalgias   Rosuvastatin Calcium     Other reaction(s): myalgias   Epinephrine Palpitations   Metformin And Related Rash   Metformin Hcl Rash  Past Medical History:  Diagnosis Date   Absence of inferior vena cava 2013   Arthritis    Asthma    Back pain    Chronic kidney disease    Congenital absence of inferior vena cava    Congenital absence of left kidney    Congenital absence of left ovary    Coronary artery disease 2004   STATUS POST STENTING OF THE RIGHT CORONARY OSTIUM    DDD (degenerative disc disease)    Diabetes mellitus without complication ([place])    Dislocated shoulder    left   DVT (deep venous thrombosis) ([place]) 2014   left posterior tibial vein   Fibromyalgia    GERD (gastroesophageal reflux disease)    High blood pressure    Hypercholesterolemia    Joint pain    Osteoarthritis    Peripheral vascular disease ([place])    Sleep apnea    Sleep apnea    SOB (shortness of breath)    Tobacco dependence    Vitamin D deficiency     Past  Surgical History:  Procedure Laterality Date   ANGIOPLASTY  2004   APPENDECTOMY     bone spur shoulder     CATARACT EXTRACTION, BILATERAL     CORONARY ANGIOPLASTY  2004   Dr. Martinique   dental implants     DIAGNOSTIC LAPAROSCOPY  1972   iud removal   ENDARTERECTOMY Left 12/29/2020   Procedure: LEFT CAROTID ENDARTERECTOMY;  Surgeon: Angelia Mould, MD;  Location: [facility] OR;  Service: Vascular;  Laterality: Left;   EYE SURGERY Bilateral    cataract removal   FOOT SURGERY     plantar fascitis   IUD REMOVAL     KNEE SURGERY  1950   TONSILLECTOMY  1955   TOTAL ABDOMINAL HYSTERECTOMY  1981   TUBAL LIGATION  1975   Dr. [place]     2014    Social History   Tobacco Use  Smoking Status Some Days   Packs/day: 0.25   Types: Cigarettes  Smokeless Tobacco Never    Social History   Substance and Sexual Activity  Alcohol Use Yes   Alcohol/week: 0.0 standard drinks   Comment: occasionally    Family History  Problem Relation Age of Onset   Leukemia Sister    Hypertension Brother    Heart disease Father        before age 29   High blood pressure Father    Sudden death Father    Breast cancer Neg Hx     Review of Systems: As noted in history of present illness.  All other systems were reviewed and are negative.  Physical Exam: BP 138/64   Pulse 79   Ht '5\' 3"'$  (1.6 m)   Wt 187 lb (84.8 kg)   SpO2 97%   BMI 33.13 kg/m  GENERAL:  Well appearing obese WF in NAD HEENT:  PERRL, EOMI, sclera are clear. Oropharynx is clear. NECK:  No jugular venous distention, carotid upstroke brisk and symmetric, left CEA scar. loud right carotid bruit. no thyromegaly or adenopathy LUNGS:  Clear to auscultation bilaterally CHEST:  Unremarkable HEART:  RRR,  PMI not displaced or sustained,S1 and S2 within normal limits, no S3, no S4: no clicks, no rubs, no murmurs ABD:  Soft, nontender. BS +, no masses or bruits. No hepatomegaly, no splenomegaly EXT:  2 + pulses  throughout, no edema, no cyanosis no clubbing SKIN:  Warm and dry.  No rashes NEURO:  Alert and oriented x 3. Cranial nerves II through XII intact. PSYCH:  Cognitively intact    LABORATORY DATA:  Lab Results  Component Value Date   WBC 12.9 (H) 12/30/2020   HGB 14.3 12/30/2020   HCT 43.9 12/30/2020   PLT 196 12/30/2020   GLUCOSE 98 05/27/2021   CHOL 161 05/27/2021   TRIG 88 05/27/2021   HDL 54 05/27/2021   LDLCALC 91 05/27/2021   ALT 21 05/27/2021   AST 26 05/27/2021   NA 142 05/27/2021   K 4.9 05/27/2021   CL 102 05/27/2021   CREATININE 0.98 05/27/2021   BUN 22 05/27/2021   CO2 19 (L) 05/27/2021   TSH 2.100 07/09/2019   INR 1.0 12/22/2020   HGBA1C 6.9 (H) 05/27/2021     Labs reviewed from primary care September 2016. Normal CMET. Cholesterol 135, trig- 247, HDL 31, LDL 55.  Dated 07/11/18: cholesterol 150, triglycerides 136, HDL 38, LDL 85.  Dated 08/17/18: A1c 6.7%. CBC and chemistries normal.  Dated 8/112/21: cholesterol 187, triglycerides 142, HDL 48, LDL 114.  Dated 11/04/20: A1c 7.2%. creatinine 1.01.  Dated 12/02/21: A1c 8.4%.  Dated 12/20/21: Normal CMET  Ecg today showed NSR rate 79. LAD. No change. I have personally reviewed and interpreted this study.   Myoview 05/17/16: Study Highlights     The left ventricular ejection fraction is hyperdynamic (>65%). Nuclear stress EF: 68%. There was no ST segment deviation noted during stress. The study is normal. This is a low risk study.   Normal resting and stress perfusion. No ischemia or infarction EF 68%     Assessment / Plan: 1. Coronary disease with remote stenting of the ostium of the right coronary. Last Myoview in 2017 was normal.  Continue  ASA  81 mg daily. Continue Toprol. Needs aggressive risk factor modification. She is asymptomatic   2. Tobacco dependence. I've encouraged continued smoking cessation  3. Left carotid stenosis. S/p left  CEA  4. Occluded IVC. Congenital.  5. HTN controlled.  Continue therapy  6. Dyslipidemia. Unable to afford Nexlizet. Intolerant of statins and PCSK 9. Will update lipid panel today. Will check with pharmacy to see if she is a candidate for  inclisaren.    7. DM type 2 on insulin. Per primary care.   8. Morbid obesity. Encouraged efforts at weight loss program.   Follow up in one year

## 2022-02-07 ENCOUNTER — Ambulatory Visit (INDEPENDENT_AMBULATORY_CARE_PROVIDER_SITE_OTHER): Payer: HMO | Admitting: Cardiology

## 2022-02-07 ENCOUNTER — Encounter: Payer: Self-pay | Admitting: Cardiology

## 2022-02-07 VITALS — BP 138/64 | HR 79 | Ht 63.0 in | Wt 187.0 lb

## 2022-02-07 DIAGNOSIS — E7800 Pure hypercholesterolemia, unspecified: Secondary | ICD-10-CM | POA: Diagnosis not present

## 2022-02-07 DIAGNOSIS — I25118 Atherosclerotic heart disease of native coronary artery with other forms of angina pectoris: Secondary | ICD-10-CM

## 2022-02-07 DIAGNOSIS — I10 Essential (primary) hypertension: Secondary | ICD-10-CM

## 2022-02-07 LAB — LIPID PANEL
Chol/HDL Ratio: 4.5 ratio — ABNORMAL HIGH (ref 0.0–4.4)
Cholesterol, Total: 215 mg/dL — ABNORMAL HIGH (ref 100–199)
HDL: 48 mg/dL (ref >39)
LDL Chol Calc (NIH): 140 mg/dL — ABNORMAL HIGH (ref 0–99)
Triglycerides: 151 mg/dL — ABNORMAL HIGH (ref 0–149)
VLDL Cholesterol Cal: 27 mg/dL (ref 5–40)

## 2022-02-07 NOTE — Patient Instructions (Signed)
Medication Instructions:  Your physician recommends that you continue on your current medications as directed. Please refer to the Current Medication list given to you today.  *If you need a refill on your cardiac medications before your next appointment, please call your pharmacy*   Lab Work: Your physician recommends that you have labs drawn today: cholesterol levels  If you have labs (blood work) drawn today and your tests are completely normal, you will receive your results only by: [place] (if you have MyChart) OR A paper copy in the mail If you have any lab test that is abnormal or we need to change your treatment, we will call you to review the results.   Follow-Up: At [facility], you and your health needs are our priority.  As part of our continuing mission to provide you with exceptional heart care, we have created designated Provider Care Teams.  These Care Teams include your primary Cardiologist (physician) and Advanced Practice Providers (APPs -  Physician Assistants and Nurse Practitioners) who all work together to provide you with the care you need, when you need it.  We recommend signing up for the patient portal called "MyChart".  Sign up information is provided on this After Visit Summary.  MyChart is used to connect with patients for Virtual Visits (Telemedicine).  Patients are able to view lab/test results, encounter notes, upcoming appointments, etc.  Non-urgent messages can be sent to your provider as well.   To learn more about what you can do with MyChart, go to [email or web address].    Your next appointment:   12 month(s)  The format for your next appointment:   In Person  Provider:  Peter Martinique, MD

## 2022-02-08 ENCOUNTER — Other Ambulatory Visit: Payer: Self-pay

## 2022-02-08 DIAGNOSIS — I25118 Atherosclerotic heart disease of native coronary artery with other forms of angina pectoris: Secondary | ICD-10-CM

## 2022-02-08 DIAGNOSIS — E7800 Pure hypercholesterolemia, unspecified: Secondary | ICD-10-CM

## 2022-02-08 DIAGNOSIS — E7849 Other hyperlipidemia: Secondary | ICD-10-CM

## 2022-02-08 DIAGNOSIS — L538 Other specified erythematous conditions: Secondary | ICD-10-CM | POA: Diagnosis not present

## 2022-02-08 DIAGNOSIS — L298 Other pruritus: Secondary | ICD-10-CM | POA: Diagnosis not present

## 2022-02-08 DIAGNOSIS — L820 Inflamed seborrheic keratosis: Secondary | ICD-10-CM | POA: Diagnosis not present

## 2022-02-08 DIAGNOSIS — L821 Other seborrheic keratosis: Secondary | ICD-10-CM | POA: Diagnosis not present

## 2022-02-08 DIAGNOSIS — R208 Other disturbances of skin sensation: Secondary | ICD-10-CM | POA: Diagnosis not present

## 2022-02-23 ENCOUNTER — Other Ambulatory Visit: Payer: Self-pay | Admitting: *Deleted

## 2022-02-23 DIAGNOSIS — I6523 Occlusion and stenosis of bilateral carotid arteries: Secondary | ICD-10-CM

## 2022-03-07 ENCOUNTER — Ambulatory Visit (INDEPENDENT_AMBULATORY_CARE_PROVIDER_SITE_OTHER): Payer: HMO | Admitting: Physician Assistant

## 2022-03-07 ENCOUNTER — Ambulatory Visit (HOSPITAL_COMMUNITY)
Admission: RE | Admit: 2022-03-07 | Discharge: 2022-03-07 | Disposition: A | Discharge status: Home / Self Care | Payer: HMO | Source: Ambulatory Visit | Attending: Surgery | Admitting: Surgery

## 2022-03-07 VITALS — BP 145/84 | HR 124 | Temp 98.0°F | Resp 16 | Ht 63.0 in | Wt 187.0 lb

## 2022-03-07 DIAGNOSIS — I6523 Occlusion and stenosis of bilateral carotid arteries: Secondary | ICD-10-CM

## 2022-03-07 NOTE — Progress Notes (Signed)
Office Note     CC:  follow up Requesting Provider:  Cari Caraway, MD  HPI: Sandra [name] is a 76 y.o. (22-Jul-1946) female who presents for surveillance of carotid artery stenosis.  She underwent left carotid endarterectomy due to high-grade asymptomatic stenosis by Dr. Scot Dock on 12/29/2020.  She denies any strokelike symptoms including slurring speech, changes in vision, or one-sided weakness.  She is on a daily aspirin.  She denies tobacco use.   Past Medical History:  Diagnosis Date   Absence of inferior vena cava 2013   Arthritis    Asthma    Back pain    Chronic kidney disease    Congenital absence of inferior vena cava    Congenital absence of left kidney    Congenital absence of left ovary    Coronary artery disease 2004   STATUS POST STENTING OF THE RIGHT CORONARY OSTIUM    DDD (degenerative disc disease)    Diabetes mellitus without complication ([place])    Dislocated shoulder    left   DVT (deep venous thrombosis) ([place]) 2014   left posterior tibial vein   Fibromyalgia    GERD (gastroesophageal reflux disease)    High blood pressure    Hypercholesterolemia    Joint pain    Osteoarthritis    Peripheral vascular disease ([place])    Sleep apnea    Sleep apnea    SOB (shortness of breath)    Tobacco dependence    Vitamin D deficiency     Past Surgical History:  Procedure Laterality Date   ANGIOPLASTY  2004   APPENDECTOMY     bone spur shoulder     CATARACT EXTRACTION, BILATERAL     CORONARY ANGIOPLASTY  2004   Dr. Martinique   dental implants     DIAGNOSTIC LAPAROSCOPY  1972   iud removal   ENDARTERECTOMY Left 12/29/2020   Procedure: LEFT CAROTID ENDARTERECTOMY;  Surgeon: Angelia Mould, MD;  Location: [facility] OR;  Service: Vascular;  Laterality: Left;   EYE SURGERY Bilateral    cataract removal   FOOT SURGERY     plantar fascitis   IUD REMOVAL     KNEE SURGERY  1950   TONSILLECTOMY  1955   TOTAL ABDOMINAL HYSTERECTOMY  1981   TUBAL LIGATION  1975   Dr.  [place]     2014    Social History   Socioeconomic History   Marital status: Widowed    Spouse name: Not on file   Number of children: 2   Years of education: Not on file   Highest education level: Not on file  Occupational History   Occupation: Producer, television/film/video: RETIRED    Comment: retired  Tobacco Use   Smoking status: Some Days    Packs/day: 0.25    Types: Cigarettes   Smokeless tobacco: Never  Vaping Use   Vaping Use: Never used  Substance and Sexual Activity   Alcohol use: Yes    Alcohol/week: 0.0 standard drinks of alcohol    Comment: occasionally   Drug use: No   Sexual activity: Not on file  Other Topics Concern   Not on file  Social History Narrative   Not on file   Social Determinants of Health   Financial Resource Strain: Not on file  Food Insecurity: No Food Insecurity (04/01/2020)   Hunger Vital Sign    Worried About Running Out of Food in the Last Year: Never true  Ran Out of Food in the Last Year: Never true  Transportation Needs: No Transportation Needs (04/01/2020)   PRAPARE - Hydrologist (Medical): No    Lack of Transportation (Non-Medical): No  Physical Activity: Not on file  Stress: Not on file  Social Connections: Not on file  Intimate Partner Violence: Not on file    Family History  Problem Relation Age of Onset   Leukemia Sister    Hypertension Brother    Heart disease Father        before age 58   High blood pressure Father    Sudden death Father    Breast cancer Neg Hx     Current Outpatient Medications  Medication Sig Dispense Refill   amitriptyline (ELAVIL) 25 MG tablet Take 2 tablets by mouth daily.     aspirin EC 81 MG tablet Take 1 tablet (81 mg total) by mouth daily. (Patient taking differently: Take 81 mg by mouth at bedtime.) 90 tablet 3   Azelaic Acid 15 % cream Apply 1 application topically daily.     Calcium Carb-Cholecalciferol (CALCIUM-VITAMIN D) 600-400  MG-UNIT TABS Take 1 tablet by mouth in the morning and at bedtime.     cetirizine (ZYRTEC) 10 MG tablet Take 10 mg by mouth daily.     Cinnamon 500 MG capsule Take 2,000 mg by mouth in the morning and at bedtime.     Coenzyme Q10 200 MG capsule Take 200 mg by mouth at bedtime.     empagliflozin (JARDIANCE) 25 MG TABS tablet Take 25 mg by mouth daily.     fluticasone (FLONASE) 50 MCG/ACT nasal spray Place 2 sprays into the nose daily as needed for allergies.     Krill Oil (OMEGA-3) 500 MG CAPS See admin instructions.     lisinopril (PRINIVIL,ZESTRIL) 2.5 MG tablet Take 2.5 mg by mouth at bedtime.     Magnesium 250 MG TABS 1 tablet with a meal     meloxicam (MOBIC) 7.5 MG tablet Take 7.5 mg by mouth in the morning.     metoprolol succinate (TOPROL-XL) 25 MG 24 hr tablet Take 25 mg by mouth daily.     metroNIDAZOLE (METROCREAM) 0.75 % cream Apply 1 application topically 2 (two) times daily.     ONETOUCH VERIO test strip SMARTSIG:90 Via Meter Twice Daily     oxybutynin (DITROPAN-XL) 10 MG 24 hr tablet Take 10 mg by mouth in the morning.     XULTOPHY 100-3.6 UNIT-MG/ML SOPN Inject 19 Units into the skin at bedtime.     No current facility-administered medications for this visit.    Allergies  Allergen Reactions   Erythromycin    Praluent [Alirocumab]     myalgias   Repatha [Evolocumab]     myalgias   Rosuvastatin Calcium     Other reaction(s): myalgias   Epinephrine Palpitations   Metformin And Related Rash   Metformin Hcl Rash     REVIEW OF SYSTEMS:   '[X]'$  denotes positive finding, '[ ]'$  denotes negative finding Cardiac  Comments:  Chest pain or chest pressure:    Shortness of breath upon exertion:    Short of breath when lying flat:    Irregular heart rhythm:        Vascular    Pain in calf, thigh, or hip brought on by ambulation:    Pain in feet at night that wakes you up from your sleep:     Blood clot in your veins:  Leg swelling:         Pulmonary    Oxygen at home:     Productive cough:     Wheezing:         Neurologic    Sudden weakness in arms or legs:     Sudden numbness in arms or legs:     Sudden onset of difficulty speaking or slurred speech:    Temporary loss of vision in one eye:     Problems with dizziness:         Gastrointestinal    Blood in stool:     Vomited blood:         Genitourinary    Burning when urinating:     Blood in urine:        Psychiatric    Major depression:         Hematologic    Bleeding problems:    Problems with blood clotting too easily:        Skin    Rashes or ulcers:        Constitutional    Fever or chills:      PHYSICAL EXAMINATION:  Vitals:   03/07/22 1426 03/07/22 1432  BP: (!) 149/83 (!) 145/84  Pulse: (!) 124 (!) 124  Resp: 16   Temp: 98 F (36.7 C)   TempSrc: Temporal   SpO2: 91%   Weight: 187 lb (84.8 kg)   Height: '5\' 3"'$  (1.6 m)     General:  WDWN in NAD; vital signs documented above Gait: Not observed HENT: WNL, normocephalic Pulmonary: normal non-labored breathing , without Rales, rhonchi,  wheezing Cardiac: regular HR Abdomen: soft, NT, no masses Skin: without rashes Vascular Exam/Pulses:  Right Left  Radial 2+ (normal) 2+ (normal)   Extremities: without ischemic changes, without Gangrene , without cellulitis; without open wounds;  Musculoskeletal: no muscle wasting or atrophy  Neurologic: A&O X 3;  cranial nerves grossly intact Psychiatric:  The pt has Normal affect.   Non-Invasive Vascular Imaging:   Right ICA 1 to 39% stenosis Left ICA endarterectomy site normal    ASSESSMENT/PLAN:: 76 y.o. female here for follow up for surveillance of carotid artery stenosis status post left carotid endarterectomy   -Carotid endarterectomy site of left ICA is widely patent.  Right ICA 1 to 39% stenosis based on duplex.  Continue aspirin daily.  Recheck carotid duplex on an annual schedule.  Patient will call/return office sooner with any questions or concerns.   Dagoberto Ligas, PA-C [facility] [phone]  Clinic MD:   Trula Slade

## 2022-03-09 ENCOUNTER — Encounter: Payer: Self-pay | Admitting: Pharmacist

## 2022-03-09 ENCOUNTER — Ambulatory Visit (INDEPENDENT_AMBULATORY_CARE_PROVIDER_SITE_OTHER): Payer: HMO | Admitting: Pharmacist

## 2022-03-09 DIAGNOSIS — I25118 Atherosclerotic heart disease of native coronary artery with other forms of angina pectoris: Secondary | ICD-10-CM

## 2022-03-09 DIAGNOSIS — E7849 Other hyperlipidemia: Secondary | ICD-10-CM

## 2022-03-09 NOTE — Progress Notes (Unsigned)
Patient ID: Sandra [name]                 DOB: 03/13/1946                    MRN: [id number]     HPI: Sandra [name] is a 76 y.o. female patient referred to lipid clinic by Dr Martinique. PMH is significant for CAD, T2DM, carotid artery stenosis and history of smoking.    Patient has a history of intolerance to statins as well as Repatha and Praluent.  Was unable to afford Nexletol.  Current Medications: N/A  Intolerances:   All caused myalgias Praluent Repatha Zetia Rosuvastatin Simvastatin  Risk Factors:  CAD T2DM Smoking  LDL goal: <55  Labs: TC 215, Trigs 151, LDL 140, HDL 48 (02/07/22. Patient reports she was not fasting)  Past Medical History:  Diagnosis Date   Absence of inferior vena cava 2013   Arthritis    Asthma    Back pain    Chronic kidney disease    Congenital absence of inferior vena cava    Congenital absence of left kidney    Congenital absence of left ovary    Coronary artery disease 2004   STATUS POST STENTING OF THE RIGHT CORONARY OSTIUM    DDD (degenerative disc disease)    Diabetes mellitus without complication ([place])    Dislocated shoulder    left   DVT (deep venous thrombosis) ([place]) 2014   left posterior tibial vein   Fibromyalgia    GERD (gastroesophageal reflux disease)    High blood pressure    Hypercholesterolemia    Joint pain    Osteoarthritis    Peripheral vascular disease ([facility])    Sleep apnea    Sleep apnea    SOB (shortness of breath)    Tobacco dependence    Vitamin D deficiency     Current Outpatient Medications on File Prior to Visit  Medication Sig Dispense Refill   amitriptyline (ELAVIL) 25 MG tablet Take 2 tablets by mouth daily.     aspirin EC 81 MG tablet Take 1 tablet (81 mg total) by mouth daily. (Patient taking differently: Take 81 mg by mouth at bedtime.) 90 tablet 3   Azelaic Acid 15 % cream Apply 1 application topically daily.     Calcium Carb-Cholecalciferol (CALCIUM-VITAMIN D) 600-400 MG-UNIT TABS Take 1  tablet by mouth in the morning and at bedtime.     cetirizine (ZYRTEC) 10 MG tablet Take 10 mg by mouth daily.     Cinnamon 500 MG capsule Take 2,000 mg by mouth in the morning and at bedtime.     Coenzyme Q10 200 MG capsule Take 200 mg by mouth at bedtime.     empagliflozin (JARDIANCE) 25 MG TABS tablet Take 25 mg by mouth daily.     fluticasone (FLONASE) 50 MCG/ACT nasal spray Place 2 sprays into the nose daily as needed for allergies.     Krill Oil (OMEGA-3) 500 MG CAPS See admin instructions.     lisinopril (PRINIVIL,ZESTRIL) 2.5 MG tablet Take 2.5 mg by mouth at bedtime.     Magnesium 250 MG TABS 1 tablet with a meal     meloxicam (MOBIC) 7.5 MG tablet Take 7.5 mg by mouth in the morning.     metoprolol succinate (TOPROL-XL) 25 MG 24 hr tablet Take 25 mg by mouth daily.     metroNIDAZOLE (METROCREAM) 0.75 % cream Apply 1 application topically 2 (two) times daily.  ONETOUCH VERIO test strip SMARTSIG:90 Via Meter Twice Daily     oxybutynin (DITROPAN-XL) 10 MG 24 hr tablet Take 10 mg by mouth in the morning.     XULTOPHY 100-3.6 UNIT-MG/ML SOPN Inject 19 Units into the skin at bedtime.     No current facility-administered medications on file prior to visit.    Allergies  Allergen Reactions   Erythromycin    Praluent [Alirocumab]     myalgias   Repatha [Evolocumab]     myalgias   Rosuvastatin Calcium     Other reaction(s): myalgias   Epinephrine Palpitations   Metformin And Related Rash   Metformin Hcl Rash    Assessment/Plan:  1. Hyperlipidemia - Patient LDL 140 which is above goal of <55.  Aggressive goal selected due to CAD, T2DM and smoking history. Since she is unable to tolerate statins, ezetimibe, or PCSK9i, next step would be Leqvio.  Advised that Marion Downer works similar to [employer] so we can not guarantee should would not have the same adverse reactions.  Patient voiced understanding.    Printed out application form for Leqvio and had patient sign.  Faxed to [facility]  service center and will await response.  Karren Cobble, PharmD, BCACP, [place], [place], [place], [place], [address] Phone: [phone], Fax: [phone]

## 2022-03-15 ENCOUNTER — Telehealth: Payer: Self-pay | Admitting: Pharmacist

## 2022-03-15 NOTE — Telephone Encounter (Signed)
Leqvio start form previously sent to company. PA requested. Sent to plan. Key:  [id number]

## 2022-03-17 ENCOUNTER — Telehealth: Payer: Self-pay

## 2022-03-17 NOTE — Telephone Encounter (Signed)
   Pre-operative Risk Assessment    Patient Name: Sandra [name]  DOB: 12/27/1945 MRN: [id number]      Request for Surgical Clearance    Procedure:   C3-4 Anterior Cervical  Fusion  Date of Surgery:  Clearance TBD                                 Surgeon:  Dr. Dominica Severin P. Cam  Surgeon's Group or Practice Name:  [facility] Phone number:  [phone] Fax number:  [phone]   Type of Clearance Requested:   - Medical    Type of Anesthesia:  General    Additional requests/questions:      Signed, Monia Pouch   03/17/2022, 10:16 AM

## 2022-03-18 ENCOUNTER — Encounter: Payer: Self-pay | Admitting: Pharmacist

## 2022-04-04 ENCOUNTER — Telehealth: Payer: Self-pay | Admitting: Pharmacist

## 2022-04-04 NOTE — Telephone Encounter (Signed)
Submitted Marion Downer PA to patient's [employer] state health plan

## 2022-04-05 ENCOUNTER — Telehealth: Payer: Self-pay | Admitting: Pharmacist

## 2022-04-05 NOTE — Telephone Encounter (Signed)
Patient's SAMS-CI score = 9.  Regional distribution: Non specific asymmetric  Temporal Onset: <4 weeks  Dechallenge: < 2 weeks  Challenge: Same symptoms reoccur upon challenge within 4 weeks

## 2022-04-13 ENCOUNTER — Encounter (INDEPENDENT_AMBULATORY_CARE_PROVIDER_SITE_OTHER): Payer: Self-pay

## 2022-04-13 NOTE — Telephone Encounter (Signed)
Leqvio PA approved through 10/07/22

## 2022-04-13 NOTE — Telephone Encounter (Signed)
PA key: [id number]

## 2022-04-14 ENCOUNTER — Other Ambulatory Visit: Payer: Self-pay | Admitting: Pharmacy Technician

## 2022-04-14 DIAGNOSIS — E785 Hyperlipidemia, unspecified: Secondary | ICD-10-CM

## 2022-04-14 DIAGNOSIS — I2510 Atherosclerotic heart disease of native coronary artery without angina pectoris: Secondary | ICD-10-CM

## 2022-04-14 DIAGNOSIS — I6523 Occlusion and stenosis of bilateral carotid arteries: Secondary | ICD-10-CM | POA: Insufficient documentation

## 2022-04-28 ENCOUNTER — Telehealth: Payer: Self-pay | Admitting: Pharmacy Technician

## 2022-04-28 NOTE — Telephone Encounter (Addendum)
Auth Submission: approved Payer: [employer] state Medication & CPT/J Code(s) submitted: Leqvio (Inclisiran) [id number] Route of submission (phone, fax, portal):  Phone # Fax # [phone] Auth type: Buy/Bill Units/visits requested: x2 doses Reference number: [id number] Approval from:  04/29/22 to 04/28/23     Medicare A/B: No auth needed Ref: [id number]  [place] ADVT: No auth needed Ref: [id number] Jaylene-G 05/11/22 @ 11:42

## 2022-05-02 ENCOUNTER — Encounter: Payer: Self-pay | Admitting: Cardiology

## 2022-05-02 DIAGNOSIS — G4733 Obstructive sleep apnea (adult) (pediatric): Secondary | ICD-10-CM | POA: Diagnosis not present

## 2022-05-03 ENCOUNTER — Telehealth: Payer: Self-pay | Admitting: Cardiology

## 2022-05-03 NOTE — Telephone Encounter (Signed)
Pt is calling requesting to speak to pharmacist in regards to infusion.

## 2022-05-03 NOTE — Telephone Encounter (Signed)
Spoke with patient an emailed her the statement of benefits form sent by [employer].  Infusion center has patient scheduled for first injection in September.

## 2022-05-23 ENCOUNTER — Ambulatory Visit (INDEPENDENT_AMBULATORY_CARE_PROVIDER_SITE_OTHER): Payer: HMO

## 2022-05-23 VITALS — BP 116/73 | HR 81 | Temp 97.8°F | Resp 18 | Ht 63.0 in | Wt 184.2 lb

## 2022-05-23 DIAGNOSIS — I2510 Atherosclerotic heart disease of native coronary artery without angina pectoris: Secondary | ICD-10-CM | POA: Diagnosis not present

## 2022-05-23 DIAGNOSIS — E785 Hyperlipidemia, unspecified: Secondary | ICD-10-CM | POA: Diagnosis not present

## 2022-05-23 DIAGNOSIS — I6523 Occlusion and stenosis of bilateral carotid arteries: Secondary | ICD-10-CM | POA: Diagnosis not present

## 2022-05-23 MED ORDER — INCLISIRAN SODIUM 284 MG/1.5ML ~~LOC~~ SOSY
284.0000 mg | PREFILLED_SYRINGE | Freq: Once | SUBCUTANEOUS | Status: AC
  Administered 2022-05-23: 284 mg via SUBCUTANEOUS
  Filled 2022-05-23: qty 1.5

## 2022-05-23 NOTE — Patient Instructions (Signed)
Inclisiran Injection What is this medication? INCLISIRAN (in [place] an) treats high cholesterol. It works by decreasing bad cholesterol (such as LDL) in your blood. Changes to diet and exercise are often combined with this medication. This medicine may be used for other purposes; ask your health care provider or pharmacist if you have questions. COMMON BRAND NAME(S): LEQVIO What should I tell my care team before I take this medication? They need to know if you have any of these conditions: An unusual or allergic reaction to inclisiran, other medications, foods, dyes, or preservatives Pregnant or trying to get pregnant Breast-feeding How should I use this medication? This medication is injected under the skin. It is given by your care team in a hospital or clinic setting. Talk to your care team about the use of this medication in children. Special care may be needed. Overdosage: If you think you have taken too much of this medicine contact a poison control center or emergency room at once. NOTE: This medicine is only for you. Do not share this medicine with others. What if I miss a dose? Keep appointments for follow-up doses. It is important not to miss your dose. Call your care team if you are unable to keep an appointment. What may interact with this medication? Interactions are not expected. This list may not describe all possible interactions. Give your health care provider a list of all the medicines, herbs, non-prescription drugs, or dietary supplements you use. Also tell them if you smoke, drink alcohol, or use illegal drugs. Some items may interact with your medicine. What should I watch for while using this medication? Visit your care team for regular checks on your progress. Tell your care team if your symptoms do not start to get better or if they get worse. You may need blood work while you are taking this medication. What side effects may I notice from receiving this  medication? Side effects that you should report to your care team as soon as possible: Allergic reactions--skin rash, itching, hives, swelling of the face, lips, tongue, or throat Shortness of breath Side effects that usually do not require medical attention (report these to your care team if they continue or are bothersome): Diarrhea Joint pain Pain, redness, or irritation at injection site This list may not describe all possible side effects. Call your doctor for medical advice about side effects. You may report side effects to FDA at 1-800-FDA-1088. Where should I keep my medication? This medication is given in a hospital or clinic. It will not be stored at home. NOTE: This sheet is a summary. It may not cover all possible information. If you have questions about this medicine, talk to your doctor, pharmacist, or health care provider.  2023 Elsevier/Gold Standard (2020-09-09 00:00:00)

## 2022-05-23 NOTE — Progress Notes (Signed)
Diagnosis: Hyperlipidemia  Provider:  Marshell Garfinkel MD  Procedure: Injection  Leqvio (inclisiran), Dose: 168 , Site: subcutaneous, Number of injections: 1  30 minutes observation completed.  Discharge: Condition: Good, Destination: Home . AVS provided to patient.   Performed by:  Arnoldo Morale, RN

## 2022-05-26 DIAGNOSIS — G720 Drug-induced myopathy: Secondary | ICD-10-CM | POA: Diagnosis not present

## 2022-05-26 DIAGNOSIS — E669 Obesity, unspecified: Secondary | ICD-10-CM | POA: Diagnosis not present

## 2022-05-26 DIAGNOSIS — R200 Anesthesia of skin: Secondary | ICD-10-CM | POA: Diagnosis not present

## 2022-05-26 DIAGNOSIS — Z794 Long term (current) use of insulin: Secondary | ICD-10-CM | POA: Diagnosis not present

## 2022-05-26 DIAGNOSIS — E1159 Type 2 diabetes mellitus with other circulatory complications: Secondary | ICD-10-CM | POA: Diagnosis not present

## 2022-05-26 DIAGNOSIS — I2510 Atherosclerotic heart disease of native coronary artery without angina pectoris: Secondary | ICD-10-CM | POA: Diagnosis not present

## 2022-06-01 DIAGNOSIS — H43822 Vitreomacular adhesion, left eye: Secondary | ICD-10-CM | POA: Diagnosis not present

## 2022-06-01 DIAGNOSIS — H524 Presbyopia: Secondary | ICD-10-CM | POA: Diagnosis not present

## 2022-06-01 DIAGNOSIS — H52203 Unspecified astigmatism, bilateral: Secondary | ICD-10-CM | POA: Diagnosis not present

## 2022-06-01 DIAGNOSIS — H35032 Hypertensive retinopathy, left eye: Secondary | ICD-10-CM | POA: Diagnosis not present

## 2022-06-01 DIAGNOSIS — E119 Type 2 diabetes mellitus without complications: Secondary | ICD-10-CM | POA: Diagnosis not present

## 2022-06-01 DIAGNOSIS — H04123 Dry eye syndrome of bilateral lacrimal glands: Secondary | ICD-10-CM | POA: Diagnosis not present

## 2022-06-01 DIAGNOSIS — Z79899 Other long term (current) drug therapy: Secondary | ICD-10-CM | POA: Diagnosis not present

## 2022-08-22 ENCOUNTER — Ambulatory Visit (INDEPENDENT_AMBULATORY_CARE_PROVIDER_SITE_OTHER): Payer: HMO | Admitting: *Deleted

## 2022-08-22 VITALS — BP 144/76 | HR 91 | Temp 97.6°F | Resp 16 | Ht 63.0 in | Wt 182.1 lb

## 2022-08-22 DIAGNOSIS — E785 Hyperlipidemia, unspecified: Secondary | ICD-10-CM | POA: Diagnosis not present

## 2022-08-22 DIAGNOSIS — I6523 Occlusion and stenosis of bilateral carotid arteries: Secondary | ICD-10-CM

## 2022-08-22 DIAGNOSIS — I2510 Atherosclerotic heart disease of native coronary artery without angina pectoris: Secondary | ICD-10-CM

## 2022-08-22 MED ORDER — INCLISIRAN SODIUM 284 MG/1.5ML ~~LOC~~ SOSY
284.0000 mg | PREFILLED_SYRINGE | Freq: Once | SUBCUTANEOUS | Status: AC
  Administered 2022-08-22: 284 mg via SUBCUTANEOUS
  Filled 2022-08-22: qty 1.5

## 2022-08-22 NOTE — Progress Notes (Signed)
Diagnosis: Hyperlipidemia  Provider:  Marshell Garfinkel MD  Procedure: Injection  Leqvio (inclisiran), Dose: 284 mg, Site: subcutaneous, Number of injections: 1  Post Care: Observation period completed  Discharge: Condition: Good, Destination: Home . AVS provided to patient.   Performed by:  Oren Beckmann, RN

## 2022-09-23 DIAGNOSIS — Z79899 Other long term (current) drug therapy: Secondary | ICD-10-CM | POA: Diagnosis not present

## 2022-09-28 DIAGNOSIS — Z79899 Other long term (current) drug therapy: Secondary | ICD-10-CM | POA: Diagnosis not present

## 2022-09-28 DIAGNOSIS — Z23 Encounter for immunization: Secondary | ICD-10-CM | POA: Diagnosis not present

## 2022-09-28 DIAGNOSIS — E1159 Type 2 diabetes mellitus with other circulatory complications: Secondary | ICD-10-CM | POA: Diagnosis not present

## 2022-09-28 DIAGNOSIS — M159 Polyosteoarthritis, unspecified: Secondary | ICD-10-CM | POA: Diagnosis not present

## 2022-09-28 DIAGNOSIS — J309 Allergic rhinitis, unspecified: Secondary | ICD-10-CM | POA: Diagnosis not present

## 2022-09-28 DIAGNOSIS — Z6833 Body mass index (BMI) 33.0-33.9, adult: Secondary | ICD-10-CM | POA: Diagnosis not present

## 2022-09-28 DIAGNOSIS — E782 Mixed hyperlipidemia: Secondary | ICD-10-CM | POA: Diagnosis not present

## 2022-09-28 DIAGNOSIS — I700 Atherosclerosis of aorta: Secondary | ICD-10-CM | POA: Diagnosis not present

## 2022-09-28 DIAGNOSIS — I2510 Atherosclerotic heart disease of native coronary artery without angina pectoris: Secondary | ICD-10-CM | POA: Diagnosis not present

## 2022-09-28 DIAGNOSIS — I10 Essential (primary) hypertension: Secondary | ICD-10-CM | POA: Diagnosis not present

## 2022-09-28 DIAGNOSIS — Z716 Tobacco abuse counseling: Secondary | ICD-10-CM | POA: Diagnosis not present

## 2022-11-03 DIAGNOSIS — M65341 Trigger finger, right ring finger: Secondary | ICD-10-CM | POA: Diagnosis not present

## 2022-11-09 DIAGNOSIS — L298 Other pruritus: Secondary | ICD-10-CM | POA: Diagnosis not present

## 2022-11-09 DIAGNOSIS — L821 Other seborrheic keratosis: Secondary | ICD-10-CM | POA: Diagnosis not present

## 2022-11-09 DIAGNOSIS — L570 Actinic keratosis: Secondary | ICD-10-CM | POA: Diagnosis not present

## 2022-11-09 DIAGNOSIS — L718 Other rosacea: Secondary | ICD-10-CM | POA: Diagnosis not present

## 2022-11-09 DIAGNOSIS — L820 Inflamed seborrheic keratosis: Secondary | ICD-10-CM | POA: Diagnosis not present

## 2022-11-09 DIAGNOSIS — L538 Other specified erythematous conditions: Secondary | ICD-10-CM | POA: Diagnosis not present

## 2022-11-09 DIAGNOSIS — L814 Other melanin hyperpigmentation: Secondary | ICD-10-CM | POA: Diagnosis not present

## 2022-11-09 DIAGNOSIS — D225 Melanocytic nevi of trunk: Secondary | ICD-10-CM | POA: Diagnosis not present

## 2022-11-10 DIAGNOSIS — G4733 Obstructive sleep apnea (adult) (pediatric): Secondary | ICD-10-CM | POA: Diagnosis not present

## 2022-11-17 ENCOUNTER — Other Ambulatory Visit: Payer: Self-pay | Admitting: Family Medicine

## 2022-11-17 DIAGNOSIS — Z1231 Encounter for screening mammogram for malignant neoplasm of breast: Secondary | ICD-10-CM

## 2022-11-23 DIAGNOSIS — Z794 Long term (current) use of insulin: Secondary | ICD-10-CM | POA: Diagnosis not present

## 2022-11-23 DIAGNOSIS — E1165 Type 2 diabetes mellitus with hyperglycemia: Secondary | ICD-10-CM | POA: Diagnosis not present

## 2022-11-23 DIAGNOSIS — E1159 Type 2 diabetes mellitus with other circulatory complications: Secondary | ICD-10-CM | POA: Diagnosis not present

## 2022-11-23 DIAGNOSIS — G720 Drug-induced myopathy: Secondary | ICD-10-CM | POA: Diagnosis not present

## 2022-11-23 DIAGNOSIS — I2510 Atherosclerotic heart disease of native coronary artery without angina pectoris: Secondary | ICD-10-CM | POA: Diagnosis not present

## 2022-11-23 DIAGNOSIS — E669 Obesity, unspecified: Secondary | ICD-10-CM | POA: Diagnosis not present

## 2022-12-26 DIAGNOSIS — E1159 Type 2 diabetes mellitus with other circulatory complications: Secondary | ICD-10-CM | POA: Diagnosis not present

## 2022-12-26 DIAGNOSIS — I2510 Atherosclerotic heart disease of native coronary artery without angina pectoris: Secondary | ICD-10-CM | POA: Diagnosis not present

## 2022-12-26 DIAGNOSIS — G720 Drug-induced myopathy: Secondary | ICD-10-CM | POA: Diagnosis not present

## 2022-12-26 DIAGNOSIS — Z794 Long term (current) use of insulin: Secondary | ICD-10-CM | POA: Diagnosis not present

## 2022-12-26 DIAGNOSIS — E669 Obesity, unspecified: Secondary | ICD-10-CM | POA: Diagnosis not present

## 2022-12-26 DIAGNOSIS — E1165 Type 2 diabetes mellitus with hyperglycemia: Secondary | ICD-10-CM | POA: Diagnosis not present

## 2023-01-03 ENCOUNTER — Encounter: Payer: Self-pay | Admitting: Cardiology

## 2023-01-03 ENCOUNTER — Ambulatory Visit: Payer: HMO

## 2023-01-12 ENCOUNTER — Encounter: Payer: Self-pay | Admitting: Cardiology

## 2023-01-12 ENCOUNTER — Ambulatory Visit
Admission: RE | Admit: 2023-01-12 | Discharge: 2023-01-12 | Disposition: A | Discharge status: Home / Self Care | Payer: PPO | Source: Ambulatory Visit | Attending: Family Medicine | Admitting: Family Medicine

## 2023-01-12 ENCOUNTER — Telehealth: Payer: Self-pay | Admitting: Pharmacy Technician

## 2023-01-12 DIAGNOSIS — Z1231 Encounter for screening mammogram for malignant neoplasm of breast: Secondary | ICD-10-CM

## 2023-01-12 NOTE — Telephone Encounter (Addendum)
 Auth Submission: NO AUTH NEEDED Site of care: Site of care: [facility] Payer: [employer] AD[employer] Medication & CPT/J Code(s) submitted: Leqvio (Inclisiran) [id number] Route of submission (phone, fax, portal):  Phone # Fax # Auth type: Buy/Bill Units/visits requested: 2 Reference number: [id number] Approval from: 01/12/23 to 10/05/24  [employer]BS No longer active  [employer]: approved

## 2023-02-09 DIAGNOSIS — G4733 Obstructive sleep apnea (adult) (pediatric): Secondary | ICD-10-CM | POA: Diagnosis not present

## 2023-02-21 ENCOUNTER — Ambulatory Visit: Payer: HMO

## 2023-02-23 ENCOUNTER — Other Ambulatory Visit: Payer: Self-pay | Admitting: *Deleted

## 2023-02-23 DIAGNOSIS — I6523 Occlusion and stenosis of bilateral carotid arteries: Secondary | ICD-10-CM

## 2023-02-27 DIAGNOSIS — G720 Drug-induced myopathy: Secondary | ICD-10-CM | POA: Diagnosis not present

## 2023-02-27 DIAGNOSIS — E669 Obesity, unspecified: Secondary | ICD-10-CM | POA: Diagnosis not present

## 2023-02-27 DIAGNOSIS — E1165 Type 2 diabetes mellitus with hyperglycemia: Secondary | ICD-10-CM | POA: Diagnosis not present

## 2023-02-27 DIAGNOSIS — I2510 Atherosclerotic heart disease of native coronary artery without angina pectoris: Secondary | ICD-10-CM | POA: Diagnosis not present

## 2023-02-27 DIAGNOSIS — Z794 Long term (current) use of insulin: Secondary | ICD-10-CM | POA: Diagnosis not present

## 2023-02-27 DIAGNOSIS — E1159 Type 2 diabetes mellitus with other circulatory complications: Secondary | ICD-10-CM | POA: Diagnosis not present

## 2023-02-28 ENCOUNTER — Ambulatory Visit (INDEPENDENT_AMBULATORY_CARE_PROVIDER_SITE_OTHER): Payer: PPO

## 2023-02-28 VITALS — BP 150/76 | HR 83 | Temp 98.2°F | Resp 16 | Ht 63.0 in | Wt 185.6 lb

## 2023-02-28 DIAGNOSIS — E785 Hyperlipidemia, unspecified: Secondary | ICD-10-CM | POA: Diagnosis not present

## 2023-02-28 DIAGNOSIS — I6523 Occlusion and stenosis of bilateral carotid arteries: Secondary | ICD-10-CM

## 2023-02-28 DIAGNOSIS — I2510 Atherosclerotic heart disease of native coronary artery without angina pectoris: Secondary | ICD-10-CM

## 2023-02-28 MED ORDER — INCLISIRAN SODIUM 284 MG/1.5ML ~~LOC~~ SOSY
284.0000 mg | PREFILLED_SYRINGE | Freq: Once | SUBCUTANEOUS | Status: AC
  Administered 2023-02-28: 284 mg via SUBCUTANEOUS
  Filled 2023-02-28: qty 1.5

## 2023-02-28 NOTE — Progress Notes (Signed)
Diagnosis: Hyperlipidemia  Provider:  Mannam, Praveen MD  Procedure: Injection  Leqvio (inclisiran), Dose: 284 mg, Site: subcutaneous, Number of injections: 1  Administered in left arm.  Post Care: Patient declined observation  Discharge: Condition: Good, Destination: Home . AVS Declined  Performed by:  Mckinze Poirier E Decie Verne, RN        

## 2023-03-07 ENCOUNTER — Ambulatory Visit (INDEPENDENT_AMBULATORY_CARE_PROVIDER_SITE_OTHER): Payer: PPO | Admitting: Physician Assistant

## 2023-03-07 ENCOUNTER — Ambulatory Visit (HOSPITAL_COMMUNITY)
Admission: RE | Admit: 2023-03-07 | Discharge: 2023-03-07 | Disposition: A | Discharge status: Home / Self Care | Payer: PPO | Source: Ambulatory Visit | Attending: Vascular Surgery | Admitting: Vascular Surgery

## 2023-03-07 VITALS — BP 118/74 | HR 91 | Temp 97.9°F | Resp 18 | Ht 63.0 in | Wt 185.9 lb

## 2023-03-07 DIAGNOSIS — I6523 Occlusion and stenosis of bilateral carotid arteries: Secondary | ICD-10-CM | POA: Diagnosis not present

## 2023-03-07 NOTE — Progress Notes (Signed)
History of Present Illness:  Patient is a 77 y.o. year old female who presents for evaluation of carotid stenosis.  presents for surveillance of carotid artery stenosis.  She underwent left carotid endarterectomy due to high-grade asymptomatic stenosis by Dr. Edilia Bo on 12/29/2020.   The patient denies symptoms of TIA, amaurosis, or stroke.       Past Medical History:  Diagnosis Date   Absence of inferior vena cava 2013   Arthritis    Asthma    Back pain    Chronic kidney disease    Congenital absence of inferior vena cava    Congenital absence of left kidney    Congenital absence of left ovary    Coronary artery disease 2004   STATUS POST STENTING OF THE RIGHT CORONARY OSTIUM    DDD (degenerative disc disease)    Diabetes mellitus without complication (HCC)    Dislocated shoulder    left   DVT (deep venous thrombosis) (HCC) 2014   left posterior tibial vein   Fibromyalgia    GERD (gastroesophageal reflux disease)    High blood pressure    Hypercholesterolemia    Joint pain    Osteoarthritis    Peripheral vascular disease (HCC)    Sleep apnea    Sleep apnea    SOB (shortness of breath)    Tobacco dependence    Vitamin D deficiency     Past Surgical History:  Procedure Laterality Date   ANGIOPLASTY  2004   APPENDECTOMY     bone spur shoulder     CATARACT EXTRACTION, BILATERAL     CORONARY ANGIOPLASTY  2004   Dr. Swaziland   dental implants     DIAGNOSTIC LAPAROSCOPY  1972   iud removal   ENDARTERECTOMY Left 12/29/2020   Procedure: LEFT CAROTID ENDARTERECTOMY;  Surgeon: Chuck Hint, MD;  Location: [facility] OR;  Service: Vascular;  Laterality: Left;   EYE SURGERY Bilateral    cataract removal   FOOT SURGERY     plantar fascitis   IUD REMOVAL     KNEE SURGERY  1950   TONSILLECTOMY  1955   TOTAL ABDOMINAL HYSTERECTOMY  1981   TUBAL LIGATION  1975   Dr. Elana Alm   VEIN BYPASS SURGERY     2014     Social History Social History   Tobacco Use    Smoking status: Some Days    Packs/day: .25    Types: Cigarettes   Smokeless tobacco: Never  Vaping Use   Vaping Use: Never used  Substance Use Topics   Alcohol use: Yes    Alcohol/week: 0.0 standard drinks of alcohol    Comment: occasionally   Drug use: No    Family History Family History  Problem Relation Age of Onset   Leukemia Sister    Hypertension Brother    Heart disease Father        before age 70   High blood pressure Father    Sudden death Father    Breast cancer Neg Hx     Allergies  Allergies  Allergen Reactions   Erythromycin    Praluent [Alirocumab]     myalgias   Repatha [Evolocumab]     myalgias   Rosuvastatin Calcium     Other reaction(s): myalgias   Epinephrine Palpitations   Metformin And Related Rash   Metformin Hcl Rash     Current Outpatient Medications  Medication Sig Dispense Refill   amitriptyline (ELAVIL) 25 MG tablet  Take 2 tablets by mouth daily.     aspirin EC 81 MG tablet Take 1 tablet (81 mg total) by mouth daily. (Patient taking differently: Take 81 mg by mouth at bedtime.) 90 tablet 3   Azelaic Acid 15 % cream Apply 1 application topically daily.     Calcium Carb-Cholecalciferol (CALCIUM-VITAMIN D) 600-400 MG-UNIT TABS Take 1 tablet by mouth in the morning and at bedtime.     cetirizine (ZYRTEC) 10 MG tablet Take 10 mg by mouth daily.     Cinnamon 500 MG capsule Take 2,000 mg by mouth in the morning and at bedtime.     Coenzyme Q10 200 MG capsule Take 200 mg by mouth at bedtime.     empagliflozin (JARDIANCE) 25 MG TABS tablet Take 25 mg by mouth daily.     fluticasone (FLONASE) 50 MCG/ACT nasal spray Place 2 sprays into the nose daily as needed for allergies.     Krill Oil (OMEGA-3) 500 MG CAPS See admin instructions.     lisinopril (PRINIVIL,ZESTRIL) 2.5 MG tablet Take 2.5 mg by mouth at bedtime.     Magnesium 250 MG TABS 1 tablet with a meal     meloxicam (MOBIC) 7.5 MG tablet Take 7.5 mg by mouth in the morning.      metoprolol succinate (TOPROL-XL) 25 MG 24 hr tablet Take 25 mg by mouth daily.     metroNIDAZOLE (METROCREAM) 0.75 % cream Apply 1 application topically 2 (two) times daily.     ONETOUCH VERIO test strip SMARTSIG:90 Via Meter Twice Daily     oxybutynin (DITROPAN-XL) 10 MG 24 hr tablet Take 10 mg by mouth in the morning.     XULTOPHY 100-3.6 UNIT-MG/ML SOPN Inject 19 Units into the skin at bedtime.     No current facility-administered medications for this visit.    ROS:   General:  No weight loss, Fever, chills  HEENT: No recent headaches, no nasal bleeding, no visual changes, no sore throat  Neurologic: No dizziness, blackouts, seizures. No recent symptoms of stroke or mini- stroke. No recent episodes of slurred speech, or temporary blindness.  Cardiac: No recent episodes of chest pain/pressure, no shortness of breath at rest.  No shortness of breath with exertion.  Denies history of atrial fibrillation or irregular heartbeat  Vascular: No history of rest pain in feet.  No history of claudication.  No history of non-healing ulcer, No history of DVT   Pulmonary: No home oxygen, no productive cough, no hemoptysis,  No asthma or wheezing  Musculoskeletal:  [ ]  Arthritis, [ ]  Low back pain,  [ ]  Joint pain  Hematologic:No history of hypercoagulable state.  No history of easy bleeding.  No history of anemia  Gastrointestinal: No hematochezia or melena,  No gastroesophageal reflux, no trouble swallowing  Urinary: [ ]  chronic Kidney disease, [ ]  on HD - [ ]  MWF or [ ]  TTHS, [ ]  Burning with urination, [ ]  Frequent urination, [ ]  Difficulty urinating;   Skin: No rashes  Psychological: No history of anxiety,  No history of depression   Physical Examination  Vitals:   03/07/23 1025  BP: 118/74  Pulse: 91  Resp: 18  Temp: 97.9 F (36.6 C)  TempSrc: Temporal  SpO2: 94%  Weight: 185 lb 14.4 oz (84.3 kg)  Height: 5\' 3"  (1.6 m)    Body mass index is 32.93 kg/m.  General:  Alert  and oriented, no acute distress HEENT: Normal Neck: No bruit or JVD Pulmonary: Clear to  auscultation bilaterally Cardiac: Regular Rate and Rhythm without murmur Gastrointestinal: Soft, non-tender, non-distended, no mass, no scars Skin: No rash Extremity Pulses:  2+ radial, brachial, femoral, dorsalis pedis, posterior tibial pulses bilaterally Musculoskeletal: No deformity or edema  Neurologic: Upper and lower extremity motor 5/5 and symmetric  DATA:  Right Carotid Findings:  +----------+--------+--------+--------+-------------------------+--------+           PSV cm/sEDV cm/sStenosisPlaque Description       Comments  +----------+--------+--------+--------+-------------------------+--------+  CCA Prox  139     21                                                 +----------+--------+--------+--------+-------------------------+--------+  CCA Mid   70      14                                                 +----------+--------+--------+--------+-------------------------+--------+  CCA Distal113     20              heterogenous                       +----------+--------+--------+--------+-------------------------+--------+  ICA Prox  176     42      40-59%  calcific and heterogenous          +----------+--------+--------+--------+-------------------------+--------+  ICA Mid   146     30                                                 +----------+--------+--------+--------+-------------------------+--------+  ICA Distal137     32                                                 +----------+--------+--------+--------+-------------------------+--------+  ECA      218     3                                                  +----------+--------+--------+--------+-------------------------+--------+   +----------+--------+-------+--------+-------------------+           PSV cm/sEDV cmsDescribeArm Pressure (mmHG)   +----------+--------+-------+--------+-------------------+  [id number]    5      Stenotic139                  +----------+--------+-------+--------+-------------------+   +---------+--------+--+--------+--+---------+  VertebralPSV cm/s47EDV cm/s16Antegrade  +---------+--------+--+--------+--+---------+      Left Carotid Findings:  +----------+--------+--------+--------+------------------+--------+           PSV cm/sEDV cm/sStenosisPlaque DescriptionComments  +----------+--------+--------+--------+------------------+--------+  CCA Prox  102     16                                          +----------+--------+--------+--------+------------------+--------+  CCA Mid   93  15                                          +----------+--------+--------+--------+------------------+--------+  CCA Distal116     16                                          +----------+--------+--------+--------+------------------+--------+  ICA Prox  113     27                                          +----------+--------+--------+--------+------------------+--------+  ICA Mid   119     28              heterogenous                +----------+--------+--------+--------+------------------+--------+  ICA Distal108     26                                          +----------+--------+--------+--------+------------------+--------+  ECA      151     7                                           +----------+--------+--------+--------+------------------+--------+   +----------+--------+--------+----------------+-------------------+           PSV cm/sEDV cm/sDescribe        Arm Pressure (mmHG)  +----------+--------+--------+----------------+-------------------+  [id number]    4       Multiphasic, [id number]                  +----------+--------+--------+----------------+-------------------+   +---------+--------+--+--------+-+---------+   VertebralPSV cm/s29EDV cm/s7Antegrade  +---------+--------+--+--------+-+---------+     Summary:  Right Carotid: Velocities in the right ICA are consistent with a 40-59%                 stenosis.   Left Carotid: There is no evidence of stenosis in the left ICA.   Vertebrals:  Bilateral vertebral arteries demonstrate antegrade flow.  Subclavians: Right subclavian artery was stenotic. Normal flow  hemodynamics were               seen in the left subclavian artery.     ASSESSMENT/PLAN:  Carotid stenosis s/p left CEA for asymptomatic high grade stenosis.   She denise symptoms of stroke.  Her duplex shows left ICA without re current stenosis and the right ICA < 50% stenosis.    She is active and is medically managed on ASA.  She has a statin allergy.  I will schedule her a yearly f/u with repeat duplex.  If she develops stroke symptoms she will call 911.      Mosetta Pigeon PA-C [facility] Office: [phone]  MD in clinic [place]

## 2023-03-08 ENCOUNTER — Telehealth: Payer: Self-pay | Admitting: Pharmacist Clinician (PhC)/ Clinical Pharmacy Specialist

## 2023-03-08 DIAGNOSIS — E7849 Other hyperlipidemia: Secondary | ICD-10-CM

## 2023-03-08 NOTE — Telephone Encounter (Signed)
Cholesterol labs - Sandra [name]

## 2023-03-16 ENCOUNTER — Encounter: Payer: Self-pay | Admitting: Physician Assistant

## 2023-03-16 ENCOUNTER — Ambulatory Visit: Payer: PPO | Attending: Physician Assistant | Admitting: Physician Assistant

## 2023-03-16 VITALS — BP 116/74 | HR 92 | Ht 63.0 in | Wt 186.0 lb

## 2023-03-16 DIAGNOSIS — I25118 Atherosclerotic heart disease of native coronary artery with other forms of angina pectoris: Secondary | ICD-10-CM

## 2023-03-16 DIAGNOSIS — E785 Hyperlipidemia, unspecified: Secondary | ICD-10-CM

## 2023-03-16 DIAGNOSIS — R011 Cardiac murmur, unspecified: Secondary | ICD-10-CM | POA: Diagnosis not present

## 2023-03-16 NOTE — Progress Notes (Signed)
  Cardiology Office Note:  .   Date:  03/16/2023  ID:  Sandra [name], DOB 1946-07-24, MRN [id number] PCP: Gweneth Dimitri, MD  [facility] Providers Cardiologist:  Peter Swaziland, MD     History of Present Illness: .   Sandra [name] is a 77 y.o. female with past medical history of CAD, tobacco abuse, hyperlipidemia, DM2 on insulin, congenital solitary kidney, DVT, history of occluded IVC and noted incidentally on MRI and CT scan and carotid artery disease s/p left CEA followed by Dr. Edilia Bo.  She had a history of stenting of ostial RCA in 2004.  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  Patient had a history of DVT involving the left posterior tibial vein occurred after treatment self injection and the laser therapy for varicose veins.  Myoview in 2017 was normal.  Patient was last seen by Dr. Swaziland on 02/07/2022 at which time she was walking 7000 steps a day.  Patient presents today for follow-up.  She denies any chest pain worsening dyspnea.  She has not been exercising as much as she used to.  She still walks around 11-3998 steps a day.  She has a Systems analyst who helps her exercising twice a week.  On physical exam, she has a aortic valve murmur.  I will recommend echocardiogram.  Recent blood work shows that her cholesterol has significantly improved after starting on inclisiran.  She can follow-up with Dr. Swaziland in 1 year.  ROS:   She denies chest pain, palpitations, dyspnea, pnd, orthopnea, n, v, dizziness, syncope, edema, weight gain, or early satiety. All other systems reviewed and are otherwise negative except as noted above.    Studies Reviewed: .        Cardiac Studies & Procedures     STRESS TESTS  MYOCARDIAL PERFUSION IMAGING 05/17/2016  Narrative  The left ventricular ejection fraction is hyperdynamic (>65%).  Nuclear stress EF: 68%.  There was no ST segment deviation noted during stress.  The study is normal.  This is a low risk  study.  Normal resting and stress perfusion. No ischemia or infarction EF 68%              Risk Assessment/Calculations:             Physical Exam:   VS:  BP 116/74   Pulse 92   Ht 5\' 3"  (1.6 m)   Wt 186 lb (84.4 kg)   SpO2 95%   BMI 32.95 kg/m    Wt Readings from Last 3 Encounters:  03/16/23 186 lb (84.4 kg)  03/07/23 185 lb 14.4 oz (84.3 kg)  02/28/23 185 lb 9.6 oz (84.2 kg)    GEN: Well nourished, well developed in no acute distress NECK: No JVD; No carotid bruits CARDIAC: RRR, no murmurs, rubs, gallops RESPIRATORY:  Clear to auscultation without rales, wheezing or rhonchi  ABDOMEN: Soft, non-tender, non-distended EXTREMITIES:  No edema; No deformity   ASSESSMENT AND PLAN: .    CAD: Myoview in 2017 was normal.  She denies any chest pain.  Heart murmur: Heart murmur present on physical exam, will obtain echocardiogram  Hyperlipidemia: On inclisiran and Krill oil       Dispo: Follow-up with Dr. Swaziland in 1 year  Signed, Azalee Course, [place]

## 2023-03-16 NOTE — Patient Instructions (Signed)
Medication Instructions:  Your physician recommends that you continue on your current medications as directed. Please refer to the Current Medication list given to you today.  *If you need a refill on your cardiac medications before your next appointment, please call your pharmacy*   Lab Work: None ordered  If you have labs (blood work) drawn today and your tests are completely normal, you will receive your results only by: MyChart Message (if you have MyChart) OR A paper copy in the mail If you have any lab test that is abnormal or we need to change your treatment, we will call you to review the results.   Testing/Procedures: Your physician has requested that you have an echocardiogram. Echocardiography is a painless test that uses sound waves to create images of your heart. It provides your doctor with information about the size and shape of your heart and how well your heart's chambers and valves are working. This procedure takes approximately one hour. There are no restrictions for this procedure. Please do NOT wear cologne, perfume, aftershave, or lotions (deodorant is allowed). Please arrive 15 minutes prior to your appointment time.    Follow-Up: At [facility], you and your health needs are our priority.  As part of our continuing mission to provide you with exceptional heart care, we have created designated Provider Care Teams.  These Care Teams include your primary Cardiologist (physician) and Advanced Practice Providers (APPs -  Physician Assistants and Nurse Practitioners) who all work together to provide you with the care you need, when you need it.  We recommend signing up for the patient portal called "MyChart".  Sign up information is provided on this After Visit Summary.  MyChart is used to connect with patients for Virtual Visits (Telemedicine).  Patients are able to view lab/test results, encounter notes, upcoming appointments, etc.  Non-urgent messages can be  sent to your provider as well.   To learn more about what you can do with MyChart, go to [email or web address].    Your next appointment:   1 year(s)  Provider:   Peter Swaziland, MD     Other Instructions

## 2023-03-20 ENCOUNTER — Other Ambulatory Visit: Payer: Self-pay

## 2023-03-20 DIAGNOSIS — I6523 Occlusion and stenosis of bilateral carotid arteries: Secondary | ICD-10-CM

## 2023-04-03 DIAGNOSIS — Z79899 Other long term (current) drug therapy: Secondary | ICD-10-CM | POA: Diagnosis not present

## 2023-04-06 DIAGNOSIS — M7061 Trochanteric bursitis, right hip: Secondary | ICD-10-CM | POA: Diagnosis not present

## 2023-04-06 DIAGNOSIS — R350 Frequency of micturition: Secondary | ICD-10-CM | POA: Diagnosis not present

## 2023-04-06 DIAGNOSIS — Z6834 Body mass index (BMI) 34.0-34.9, adult: Secondary | ICD-10-CM | POA: Diagnosis not present

## 2023-04-06 DIAGNOSIS — Z794 Long term (current) use of insulin: Secondary | ICD-10-CM | POA: Diagnosis not present

## 2023-04-06 DIAGNOSIS — E119 Type 2 diabetes mellitus without complications: Secondary | ICD-10-CM | POA: Diagnosis not present

## 2023-04-06 DIAGNOSIS — E782 Mixed hyperlipidemia: Secondary | ICD-10-CM | POA: Diagnosis not present

## 2023-04-06 DIAGNOSIS — M7062 Trochanteric bursitis, left hip: Secondary | ICD-10-CM | POA: Diagnosis not present

## 2023-04-06 DIAGNOSIS — I10 Essential (primary) hypertension: Secondary | ICD-10-CM | POA: Diagnosis not present

## 2023-04-06 DIAGNOSIS — G4733 Obstructive sleep apnea (adult) (pediatric): Secondary | ICD-10-CM | POA: Diagnosis not present

## 2023-04-06 DIAGNOSIS — F17200 Nicotine dependence, unspecified, uncomplicated: Secondary | ICD-10-CM | POA: Diagnosis not present

## 2023-04-06 DIAGNOSIS — I2510 Atherosclerotic heart disease of native coronary artery without angina pectoris: Secondary | ICD-10-CM | POA: Diagnosis not present

## 2023-04-17 ENCOUNTER — Encounter: Payer: Self-pay | Admitting: Cardiology

## 2023-04-17 ENCOUNTER — Ambulatory Visit (HOSPITAL_COMMUNITY): Payer: PPO | Attending: Cardiovascular Disease

## 2023-04-17 DIAGNOSIS — I25118 Atherosclerotic heart disease of native coronary artery with other forms of angina pectoris: Secondary | ICD-10-CM | POA: Diagnosis not present

## 2023-04-17 DIAGNOSIS — R011 Cardiac murmur, unspecified: Secondary | ICD-10-CM | POA: Diagnosis not present

## 2023-04-17 LAB — ECHOCARDIOGRAM COMPLETE
Area-P 1/2: 3.91 cm2
Calc EF: 74.6 %
S' Lateral: 2.5 cm
Single Plane A2C EF: 74.2 %
Single Plane A4C EF: 75.6 %

## 2023-04-20 DIAGNOSIS — M25551 Pain in right hip: Secondary | ICD-10-CM | POA: Diagnosis not present

## 2023-04-20 DIAGNOSIS — M25552 Pain in left hip: Secondary | ICD-10-CM | POA: Diagnosis not present

## 2023-04-27 DIAGNOSIS — M25551 Pain in right hip: Secondary | ICD-10-CM | POA: Diagnosis not present

## 2023-04-27 DIAGNOSIS — M25552 Pain in left hip: Secondary | ICD-10-CM | POA: Diagnosis not present

## 2023-05-03 DIAGNOSIS — M25551 Pain in right hip: Secondary | ICD-10-CM | POA: Diagnosis not present

## 2023-05-03 DIAGNOSIS — M25552 Pain in left hip: Secondary | ICD-10-CM | POA: Diagnosis not present

## 2023-05-17 DIAGNOSIS — M25552 Pain in left hip: Secondary | ICD-10-CM | POA: Diagnosis not present

## 2023-05-17 DIAGNOSIS — M25551 Pain in right hip: Secondary | ICD-10-CM | POA: Diagnosis not present

## 2023-05-23 DIAGNOSIS — M25552 Pain in left hip: Secondary | ICD-10-CM | POA: Diagnosis not present

## 2023-05-23 DIAGNOSIS — M25551 Pain in right hip: Secondary | ICD-10-CM | POA: Diagnosis not present

## 2023-05-24 DIAGNOSIS — Z794 Long term (current) use of insulin: Secondary | ICD-10-CM | POA: Diagnosis not present

## 2023-05-24 DIAGNOSIS — E669 Obesity, unspecified: Secondary | ICD-10-CM | POA: Diagnosis not present

## 2023-05-24 DIAGNOSIS — E1159 Type 2 diabetes mellitus with other circulatory complications: Secondary | ICD-10-CM | POA: Diagnosis not present

## 2023-05-24 DIAGNOSIS — I2510 Atherosclerotic heart disease of native coronary artery without angina pectoris: Secondary | ICD-10-CM | POA: Diagnosis not present

## 2023-05-24 DIAGNOSIS — G720 Drug-induced myopathy: Secondary | ICD-10-CM | POA: Diagnosis not present

## 2023-05-25 DIAGNOSIS — G4733 Obstructive sleep apnea (adult) (pediatric): Secondary | ICD-10-CM | POA: Diagnosis not present

## 2023-06-05 DIAGNOSIS — H04123 Dry eye syndrome of bilateral lacrimal glands: Secondary | ICD-10-CM | POA: Diagnosis not present

## 2023-06-05 DIAGNOSIS — Z961 Presence of intraocular lens: Secondary | ICD-10-CM | POA: Diagnosis not present

## 2023-06-05 DIAGNOSIS — H01004 Unspecified blepharitis left upper eyelid: Secondary | ICD-10-CM | POA: Diagnosis not present

## 2023-06-05 DIAGNOSIS — H52203 Unspecified astigmatism, bilateral: Secondary | ICD-10-CM | POA: Diagnosis not present

## 2023-06-05 DIAGNOSIS — H35033 Hypertensive retinopathy, bilateral: Secondary | ICD-10-CM | POA: Diagnosis not present

## 2023-06-05 DIAGNOSIS — H0100A Unspecified blepharitis right eye, upper and lower eyelids: Secondary | ICD-10-CM | POA: Diagnosis not present

## 2023-06-05 DIAGNOSIS — H524 Presbyopia: Secondary | ICD-10-CM | POA: Diagnosis not present

## 2023-06-05 DIAGNOSIS — H43822 Vitreomacular adhesion, left eye: Secondary | ICD-10-CM | POA: Diagnosis not present

## 2023-06-05 DIAGNOSIS — E119 Type 2 diabetes mellitus without complications: Secondary | ICD-10-CM | POA: Diagnosis not present

## 2023-06-06 DIAGNOSIS — M25551 Pain in right hip: Secondary | ICD-10-CM | POA: Diagnosis not present

## 2023-06-06 DIAGNOSIS — M25552 Pain in left hip: Secondary | ICD-10-CM | POA: Diagnosis not present

## 2023-06-12 DIAGNOSIS — M25552 Pain in left hip: Secondary | ICD-10-CM | POA: Diagnosis not present

## 2023-06-12 DIAGNOSIS — M25551 Pain in right hip: Secondary | ICD-10-CM | POA: Diagnosis not present

## 2023-06-19 DIAGNOSIS — M25551 Pain in right hip: Secondary | ICD-10-CM | POA: Diagnosis not present

## 2023-06-19 DIAGNOSIS — M25552 Pain in left hip: Secondary | ICD-10-CM | POA: Diagnosis not present

## 2023-06-30 DIAGNOSIS — G4733 Obstructive sleep apnea (adult) (pediatric): Secondary | ICD-10-CM | POA: Diagnosis not present

## 2023-07-26 DIAGNOSIS — G4733 Obstructive sleep apnea (adult) (pediatric): Secondary | ICD-10-CM | POA: Diagnosis not present

## 2023-08-16 DIAGNOSIS — Z79899 Other long term (current) drug therapy: Secondary | ICD-10-CM | POA: Diagnosis not present

## 2023-08-16 DIAGNOSIS — E1159 Type 2 diabetes mellitus with other circulatory complications: Secondary | ICD-10-CM | POA: Diagnosis not present

## 2023-08-21 DIAGNOSIS — E782 Mixed hyperlipidemia: Secondary | ICD-10-CM | POA: Diagnosis not present

## 2023-08-21 DIAGNOSIS — Z9181 History of falling: Secondary | ICD-10-CM | POA: Diagnosis not present

## 2023-08-21 DIAGNOSIS — Z01419 Encounter for gynecological examination (general) (routine) without abnormal findings: Secondary | ICD-10-CM | POA: Diagnosis not present

## 2023-08-21 DIAGNOSIS — Z1331 Encounter for screening for depression: Secondary | ICD-10-CM | POA: Diagnosis not present

## 2023-08-21 DIAGNOSIS — Z87891 Personal history of nicotine dependence: Secondary | ICD-10-CM | POA: Diagnosis not present

## 2023-08-21 DIAGNOSIS — Z0000 Encounter for general adult medical examination without abnormal findings: Secondary | ICD-10-CM | POA: Diagnosis not present

## 2023-08-21 DIAGNOSIS — M159 Polyosteoarthritis, unspecified: Secondary | ICD-10-CM | POA: Diagnosis not present

## 2023-08-21 DIAGNOSIS — J309 Allergic rhinitis, unspecified: Secondary | ICD-10-CM | POA: Diagnosis not present

## 2023-08-21 DIAGNOSIS — E119 Type 2 diabetes mellitus without complications: Secondary | ICD-10-CM | POA: Diagnosis not present

## 2023-08-21 DIAGNOSIS — B372 Candidiasis of skin and nail: Secondary | ICD-10-CM | POA: Diagnosis not present

## 2023-08-21 DIAGNOSIS — R944 Abnormal results of kidney function studies: Secondary | ICD-10-CM | POA: Diagnosis not present

## 2023-08-21 DIAGNOSIS — I10 Essential (primary) hypertension: Secondary | ICD-10-CM | POA: Diagnosis not present

## 2023-08-31 ENCOUNTER — Ambulatory Visit: Payer: PPO

## 2023-09-01 ENCOUNTER — Ambulatory Visit (INDEPENDENT_AMBULATORY_CARE_PROVIDER_SITE_OTHER): Payer: PPO

## 2023-09-01 VITALS — BP 154/77 | HR 86 | Temp 97.3°F | Resp 16 | Ht 63.0 in | Wt 186.2 lb

## 2023-09-01 DIAGNOSIS — I2510 Atherosclerotic heart disease of native coronary artery without angina pectoris: Secondary | ICD-10-CM | POA: Diagnosis not present

## 2023-09-01 DIAGNOSIS — I6523 Occlusion and stenosis of bilateral carotid arteries: Secondary | ICD-10-CM

## 2023-09-01 DIAGNOSIS — E785 Hyperlipidemia, unspecified: Secondary | ICD-10-CM

## 2023-09-01 MED ORDER — INCLISIRAN SODIUM 284 MG/1.5ML ~~LOC~~ SOSY
284.0000 mg | PREFILLED_SYRINGE | Freq: Once | SUBCUTANEOUS | Status: AC
  Administered 2023-09-01: 284 mg via SUBCUTANEOUS
  Filled 2023-09-01: qty 1.5

## 2023-09-01 NOTE — Progress Notes (Signed)
Diagnosis: Hyperlipidemia  Provider:  Chilton Greathouse MD  Procedure: Injection  Leqvio (inclisiran), Dose: 284 mg, Site: subcutaneous, Number of injections: 1  Injection Site(s): Right lower quad. abdomne  Post Care:  right lower abdominal injection  Discharge: Condition: Good, Destination: Home . AVS Declined  Performed by:  Rico Ala, LPN

## 2023-09-04 ENCOUNTER — Encounter: Payer: Self-pay | Admitting: Cardiology

## 2023-10-10 ENCOUNTER — Encounter: Payer: Self-pay | Admitting: Cardiology

## 2023-11-03 ENCOUNTER — Encounter: Payer: Self-pay | Admitting: Cardiology

## 2023-11-15 DIAGNOSIS — L814 Other melanin hyperpigmentation: Secondary | ICD-10-CM | POA: Diagnosis not present

## 2023-11-15 DIAGNOSIS — L821 Other seborrheic keratosis: Secondary | ICD-10-CM | POA: Diagnosis not present

## 2023-11-15 DIAGNOSIS — L739 Follicular disorder, unspecified: Secondary | ICD-10-CM | POA: Diagnosis not present

## 2023-11-15 DIAGNOSIS — D225 Melanocytic nevi of trunk: Secondary | ICD-10-CM | POA: Diagnosis not present

## 2023-11-15 DIAGNOSIS — L570 Actinic keratosis: Secondary | ICD-10-CM | POA: Diagnosis not present

## 2023-11-15 DIAGNOSIS — D170 Benign lipomatous neoplasm of skin and subcutaneous tissue of head, face and neck: Secondary | ICD-10-CM | POA: Diagnosis not present

## 2023-11-15 DIAGNOSIS — D2262 Melanocytic nevi of left upper limb, including shoulder: Secondary | ICD-10-CM | POA: Diagnosis not present

## 2023-11-22 DIAGNOSIS — E1159 Type 2 diabetes mellitus with other circulatory complications: Secondary | ICD-10-CM | POA: Diagnosis not present

## 2023-11-22 DIAGNOSIS — G720 Drug-induced myopathy: Secondary | ICD-10-CM | POA: Diagnosis not present

## 2023-11-22 DIAGNOSIS — Z794 Long term (current) use of insulin: Secondary | ICD-10-CM | POA: Diagnosis not present

## 2023-12-20 ENCOUNTER — Telehealth: Payer: Self-pay | Admitting: Cardiology

## 2023-12-20 NOTE — Telephone Encounter (Signed)
 Spoke to patient . She states she just spoke to someone for our office. They informed her they were sending this message to Pharmacy team  Patient request to speak to Kristin. Rn informed patient the message will be sent to her and pharmacy team   Patient states she has been trying to get some answer why she has received a bill concerning having Leqvio injections at the infusion center. She states she has been dealing with this for 3 months

## 2023-12-20 NOTE — Telephone Encounter (Signed)
 Pt c/o medication issue:  1. Name of Medication: inclisiran (LEQVIO) 284 MG/1.5ML SOSY injection   2. How are you currently taking this medication (dosage and times per day)? As written  3. Are you having a reaction (difficulty breathing--STAT)? No   4. What is your medication issue? Have questions about the cost of this medication

## 2023-12-27 NOTE — Telephone Encounter (Signed)
 Information was forwarded to management per Chris Pavero

## 2024-01-01 DIAGNOSIS — M20012 Mallet finger of left finger(s): Secondary | ICD-10-CM | POA: Diagnosis not present

## 2024-01-01 DIAGNOSIS — M152 Bouchard's nodes (with arthropathy): Secondary | ICD-10-CM | POA: Diagnosis not present

## 2024-01-01 DIAGNOSIS — M25542 Pain in joints of left hand: Secondary | ICD-10-CM | POA: Diagnosis not present

## 2024-01-02 ENCOUNTER — Telehealth: Payer: Self-pay | Admitting: Pharmacy Technician

## 2024-01-02 NOTE — Telephone Encounter (Addendum)
 Leqvio  bill:   Per notes from [employer]: The last injection date for this patient was 12.27.24, but her [employer] grant expired in 2022, limiting assistance options as assistance for the December visit is not possible due to the program's strict 30-day look-back period. We re-enrolled the patient on 04.17.25 and we are monitoring patient's account for a future infusion in June, which may qualify for assistance.   I have spoke with her and explained she will be re-enrolled for the [facility] and I will f/u once she is approved.  Burdette Carolin

## 2024-01-03 NOTE — Telephone Encounter (Signed)
 F/U:  Left v/m.  Patient has been approved for the [employer].  Letter has been scanned to media tab

## 2024-01-11 DIAGNOSIS — M20012 Mallet finger of left finger(s): Secondary | ICD-10-CM | POA: Diagnosis not present

## 2024-01-11 DIAGNOSIS — M25542 Pain in joints of left hand: Secondary | ICD-10-CM | POA: Diagnosis not present

## 2024-02-05 DIAGNOSIS — M25542 Pain in joints of left hand: Secondary | ICD-10-CM | POA: Diagnosis not present

## 2024-02-05 DIAGNOSIS — M20012 Mallet finger of left finger(s): Secondary | ICD-10-CM | POA: Diagnosis not present

## 2024-02-06 DIAGNOSIS — M20012 Mallet finger of left finger(s): Secondary | ICD-10-CM | POA: Diagnosis not present

## 2024-02-12 ENCOUNTER — Other Ambulatory Visit: Payer: Self-pay | Admitting: Family Medicine

## 2024-02-12 DIAGNOSIS — Z0000 Encounter for general adult medical examination without abnormal findings: Secondary | ICD-10-CM

## 2024-02-13 ENCOUNTER — Ambulatory Visit
Admission: RE | Admit: 2024-02-13 | Discharge: 2024-02-13 | Disposition: A | Discharge status: Home / Self Care | Source: Ambulatory Visit | Attending: Family Medicine | Admitting: Family Medicine

## 2024-02-13 ENCOUNTER — Encounter: Payer: Self-pay | Admitting: Cardiology

## 2024-02-13 DIAGNOSIS — Z0000 Encounter for general adult medical examination without abnormal findings: Secondary | ICD-10-CM

## 2024-02-13 DIAGNOSIS — Z1231 Encounter for screening mammogram for malignant neoplasm of breast: Secondary | ICD-10-CM | POA: Diagnosis not present

## 2024-02-19 DIAGNOSIS — M25542 Pain in joints of left hand: Secondary | ICD-10-CM | POA: Diagnosis not present

## 2024-02-19 DIAGNOSIS — M20012 Mallet finger of left finger(s): Secondary | ICD-10-CM | POA: Diagnosis not present

## 2024-02-20 DIAGNOSIS — M25542 Pain in joints of left hand: Secondary | ICD-10-CM | POA: Diagnosis not present

## 2024-02-20 DIAGNOSIS — M20012 Mallet finger of left finger(s): Secondary | ICD-10-CM | POA: Diagnosis not present

## 2024-03-01 ENCOUNTER — Ambulatory Visit (INDEPENDENT_AMBULATORY_CARE_PROVIDER_SITE_OTHER): Payer: PPO

## 2024-03-01 VITALS — BP 124/75 | HR 88 | Temp 98.1°F | Resp 20 | Ht 63.0 in | Wt 187.0 lb

## 2024-03-01 DIAGNOSIS — I6523 Occlusion and stenosis of bilateral carotid arteries: Secondary | ICD-10-CM

## 2024-03-01 DIAGNOSIS — I2510 Atherosclerotic heart disease of native coronary artery without angina pectoris: Secondary | ICD-10-CM | POA: Diagnosis not present

## 2024-03-01 DIAGNOSIS — E785 Hyperlipidemia, unspecified: Secondary | ICD-10-CM | POA: Diagnosis not present

## 2024-03-01 MED ORDER — INCLISIRAN SODIUM 284 MG/1.5ML ~~LOC~~ SOSY
284.0000 mg | PREFILLED_SYRINGE | Freq: Once | SUBCUTANEOUS | Status: AC
  Administered 2024-03-01: 284 mg via SUBCUTANEOUS
  Filled 2024-03-01: qty 1.5

## 2024-03-01 NOTE — Progress Notes (Signed)
 Diagnosis: Hyperlipidemia  Provider:  Mannam, Praveen MD  Procedure: Injection  Leqvio  (inclisiran), Dose: 284 mg, Site: subcutaneous, Number of injections: 1  Injection Site(s): Left lower quad. abdomen  Post Care: Patient declined observation  Discharge: Condition: Good, Destination: Home . AVS Declined  Performed by:  Star [place], LPN

## 2024-03-04 DIAGNOSIS — R944 Abnormal results of kidney function studies: Secondary | ICD-10-CM | POA: Diagnosis not present

## 2024-03-04 DIAGNOSIS — E1159 Type 2 diabetes mellitus with other circulatory complications: Secondary | ICD-10-CM | POA: Diagnosis not present

## 2024-03-07 DIAGNOSIS — Z794 Long term (current) use of insulin: Secondary | ICD-10-CM | POA: Diagnosis not present

## 2024-03-07 DIAGNOSIS — Z6834 Body mass index (BMI) 34.0-34.9, adult: Secondary | ICD-10-CM | POA: Diagnosis not present

## 2024-03-07 DIAGNOSIS — E1159 Type 2 diabetes mellitus with other circulatory complications: Secondary | ICD-10-CM | POA: Diagnosis not present

## 2024-03-07 DIAGNOSIS — E1165 Type 2 diabetes mellitus with hyperglycemia: Secondary | ICD-10-CM | POA: Diagnosis not present

## 2024-03-07 DIAGNOSIS — I10 Essential (primary) hypertension: Secondary | ICD-10-CM | POA: Diagnosis not present

## 2024-03-07 DIAGNOSIS — Z87891 Personal history of nicotine dependence: Secondary | ICD-10-CM | POA: Diagnosis not present

## 2024-03-07 DIAGNOSIS — Z79899 Other long term (current) drug therapy: Secondary | ICD-10-CM | POA: Diagnosis not present

## 2024-03-07 DIAGNOSIS — M159 Polyosteoarthritis, unspecified: Secondary | ICD-10-CM | POA: Diagnosis not present

## 2024-03-12 DIAGNOSIS — M20012 Mallet finger of left finger(s): Secondary | ICD-10-CM | POA: Diagnosis not present

## 2024-03-13 DIAGNOSIS — E1159 Type 2 diabetes mellitus with other circulatory complications: Secondary | ICD-10-CM | POA: Diagnosis not present

## 2024-03-13 DIAGNOSIS — Z794 Long term (current) use of insulin: Secondary | ICD-10-CM | POA: Diagnosis not present

## 2024-03-13 DIAGNOSIS — G720 Drug-induced myopathy: Secondary | ICD-10-CM | POA: Diagnosis not present

## 2024-03-14 DIAGNOSIS — M20012 Mallet finger of left finger(s): Secondary | ICD-10-CM | POA: Diagnosis not present

## 2024-03-14 DIAGNOSIS — M25542 Pain in joints of left hand: Secondary | ICD-10-CM | POA: Diagnosis not present

## 2024-03-19 ENCOUNTER — Other Ambulatory Visit: Payer: Self-pay

## 2024-03-21 ENCOUNTER — Other Ambulatory Visit: Payer: Self-pay | Admitting: Vascular Surgery

## 2024-03-21 DIAGNOSIS — I6523 Occlusion and stenosis of bilateral carotid arteries: Secondary | ICD-10-CM

## 2024-04-01 DIAGNOSIS — M20012 Mallet finger of left finger(s): Secondary | ICD-10-CM | POA: Diagnosis not present

## 2024-04-01 DIAGNOSIS — M25542 Pain in joints of left hand: Secondary | ICD-10-CM | POA: Diagnosis not present

## 2024-04-04 ENCOUNTER — Ambulatory Visit (HOSPITAL_COMMUNITY)
Admission: RE | Admit: 2024-04-04 | Discharge: 2024-04-04 | Disposition: A | Discharge status: Home / Self Care | Source: Ambulatory Visit | Attending: Vascular Surgery | Admitting: Vascular Surgery

## 2024-04-04 ENCOUNTER — Ambulatory Visit: Attending: Vascular Surgery | Admitting: Physician Assistant

## 2024-04-04 VITALS — BP 119/73 | HR 82 | Temp 97.7°F | Wt 187.6 lb

## 2024-04-04 DIAGNOSIS — I6523 Occlusion and stenosis of bilateral carotid arteries: Secondary | ICD-10-CM | POA: Insufficient documentation

## 2024-04-05 NOTE — Progress Notes (Signed)
 Office Note   History of Present Illness   Sandra [name] is a 78 y.o. ([birth year]) female who presents for surveillance of carotid artery stenosis.  She has a history of left carotid endarterectomy on 12/29/2020 by Dr. Eliza.  This was done for asymptomatic high-grade stenosis.  The patient returns today for follow-up.  She says that she is doing well without any issues.  She denies any recent strokelike symptoms such as slurred speech, facial droop, sudden visual changes, or sudden weakness/numbness.  Current Outpatient Medications  Medication Sig Dispense Refill   amitriptyline (ELAVIL) 25 MG tablet Take 2 tablets by mouth daily.     aspirin  EC 81 MG tablet Take 1 tablet (81 mg total) by mouth daily. (Patient taking differently: Take 81 mg by mouth at bedtime.) 90 tablet 3   Azelaic Acid 15 % cream Apply 1 application topically daily.     Calcium  Carb-Cholecalciferol (CALCIUM -VITAMIN D ) 600-400 MG-UNIT TABS Take 1 tablet by mouth in the morning and at bedtime.     cetirizine (ZYRTEC) 10 MG tablet Take 10 mg by mouth daily.     Cinnamon 500 MG capsule Take 2,000 mg by mouth in the morning and at bedtime.     Coenzyme Q10 200 MG capsule Take 200 mg by mouth at bedtime.     empagliflozin  (JARDIANCE ) 25 MG TABS tablet Take 25 mg by mouth daily.     fluticasone (FLONASE) 50 MCG/ACT nasal spray Place 2 sprays into the nose daily as needed for allergies.     inclisiran (LEQVIO ) 284 MG/1.5ML SOSY injection Inject 284 mg into the skin every 6 (six) months.     Krill Oil (OMEGA-3) 500 MG CAPS See admin instructions.     lisinopril  (PRINIVIL ,ZESTRIL ) 2.5 MG tablet Take 2.5 mg by mouth at bedtime.     Magnesium  250 MG TABS 1 tablet with a meal     meloxicam (MOBIC) 7.5 MG tablet Take 7.5 mg by mouth in the morning.     metoprolol  succinate (TOPROL -XL) 25 MG 24 hr tablet Take 25 mg by mouth daily.     metroNIDAZOLE (METROCREAM) 0.75 % cream Apply 1 application topically 2 (two) times daily.      ONETOUCH VERIO test strip SMARTSIG:90 Via Meter Twice Daily     oxybutynin  (DITROPAN -XL) 10 MG 24 hr tablet Take 10 mg by mouth in the morning.     XULTOPHY  100-3.6 UNIT-MG/ML SOPN Inject 19 Units into the skin at bedtime.     No current facility-administered medications for this visit.    REVIEW OF SYSTEMS (negative unless checked):   Cardiac:  []  Chest pain or chest pressure? []  Shortness of breath upon activity? []  Shortness of breath when lying flat? []  Irregular heart rhythm?  Vascular:  []  Pain in calf, thigh, or hip brought on by walking? []  Pain in feet at night that wakes you up from your sleep? []  Blood clot in your veins? []  Leg swelling?  Pulmonary:  []  Oxygen at home? []  Productive cough? []  Wheezing?  Neurologic:  []  Sudden weakness in arms or legs? []  Sudden numbness in arms or legs? []  Sudden onset of difficult speaking or slurred speech? []  Temporary loss of vision in one eye? []  Problems with dizziness?  Gastrointestinal:  []  Blood in stool? []  Vomited blood?  Genitourinary:  []  Burning when urinating? []  Blood in urine?  Psychiatric:  []  Major depression  Hematologic:  []  Bleeding problems? []  Problems with blood clotting?  Dermatologic:  []   Rashes or ulcers?  Constitutional:  []  Fever or chills?  Ear/Nose/Throat:  []  Change in hearing? []  Nose bleeds? []  Sore throat?  Musculoskeletal:  []  Back pain? []  Joint pain? []  Muscle pain?   Physical Examination   Vitals:   04/04/24 1506 04/04/24 1510  BP: (!) 143/73 119/73  Pulse: 82   Temp: 97.7 F (36.5 C)   TempSrc: Temporal   Weight: 187 lb 9.6 oz (85.1 kg)    Body mass index is 33.23 kg/m.  General:  WDWN in NAD; vital signs documented above Gait: Not observed HENT: WNL, normocephalic Pulmonary: normal non-labored breathing , without rales, rhonchi,  wheezing Cardiac: regular Abdomen: soft, NT, no masses Skin: without rashes Vascular Exam/Pulses: palpable radial  pulses bilaterally Extremities: without ischemic changes, without gangrene , without cellulitis; without open wounds;  Musculoskeletal: no muscle wasting or atrophy  Neurologic: A&O X 3;  No focal weakness or paresthesias are detected Psychiatric:  The pt has Normal affect.  Non-Invasive Vascular Imaging   Bilateral Carotid Duplex (04/04/2024):  R ICA stenosis:  40-59% R VA:  patent and antegrade L ICA stenosis:  None L VA:  patent and antegrade   Medical Decision Making   Sandra [name] is a 78 y.o. female who presents for surveillance of carotid artery stenosis  Based on the patient's vascular studies, her carotid artery stenosis is unchanged bilaterally.  She has stable 40 to 59% stenosis of the right carotid artery.  She has a patent left carotid endarterectomy site without restenosis She denies any strokelike symptoms such as slurred speech, facial droop, sudden visual changes, or sudden weakness/numbness She has palpable and equal radial pulses bilaterally.  She has no neurological deficits on exam She can follow-up with our office in 1 year with repeat carotid duplex   Ahmed Holster PA-C [facility] Office: [phone]  Call MD: Gretta

## 2024-04-16 DIAGNOSIS — G4733 Obstructive sleep apnea (adult) (pediatric): Secondary | ICD-10-CM | POA: Diagnosis not present

## 2024-04-23 DIAGNOSIS — M25542 Pain in joints of left hand: Secondary | ICD-10-CM | POA: Diagnosis not present

## 2024-04-23 DIAGNOSIS — M20012 Mallet finger of left finger(s): Secondary | ICD-10-CM | POA: Diagnosis not present

## 2024-04-24 DIAGNOSIS — M67912 Unspecified disorder of synovium and tendon, left shoulder: Secondary | ICD-10-CM | POA: Diagnosis not present

## 2024-04-30 DIAGNOSIS — M19012 Primary osteoarthritis, left shoulder: Secondary | ICD-10-CM | POA: Diagnosis not present

## 2024-05-02 DIAGNOSIS — M19012 Primary osteoarthritis, left shoulder: Secondary | ICD-10-CM | POA: Diagnosis not present

## 2024-05-08 DIAGNOSIS — M19012 Primary osteoarthritis, left shoulder: Secondary | ICD-10-CM | POA: Diagnosis not present

## 2024-05-10 DIAGNOSIS — M19012 Primary osteoarthritis, left shoulder: Secondary | ICD-10-CM | POA: Diagnosis not present

## 2024-05-14 DIAGNOSIS — M20012 Mallet finger of left finger(s): Secondary | ICD-10-CM | POA: Diagnosis not present

## 2024-05-16 DIAGNOSIS — M19012 Primary osteoarthritis, left shoulder: Secondary | ICD-10-CM | POA: Diagnosis not present

## 2024-05-21 DIAGNOSIS — M19012 Primary osteoarthritis, left shoulder: Secondary | ICD-10-CM | POA: Diagnosis not present

## 2024-05-27 DIAGNOSIS — M19012 Primary osteoarthritis, left shoulder: Secondary | ICD-10-CM | POA: Diagnosis not present

## 2024-05-28 DIAGNOSIS — Z794 Long term (current) use of insulin: Secondary | ICD-10-CM | POA: Diagnosis not present

## 2024-05-28 DIAGNOSIS — G720 Drug-induced myopathy: Secondary | ICD-10-CM | POA: Diagnosis not present

## 2024-05-28 DIAGNOSIS — E1159 Type 2 diabetes mellitus with other circulatory complications: Secondary | ICD-10-CM | POA: Diagnosis not present

## 2024-05-29 DIAGNOSIS — M19012 Primary osteoarthritis, left shoulder: Secondary | ICD-10-CM | POA: Diagnosis not present

## 2024-06-05 DIAGNOSIS — M19012 Primary osteoarthritis, left shoulder: Secondary | ICD-10-CM | POA: Diagnosis not present

## 2024-06-18 DIAGNOSIS — H524 Presbyopia: Secondary | ICD-10-CM | POA: Diagnosis not present

## 2024-06-18 DIAGNOSIS — H26492 Other secondary cataract, left eye: Secondary | ICD-10-CM | POA: Diagnosis not present

## 2024-06-18 DIAGNOSIS — H43822 Vitreomacular adhesion, left eye: Secondary | ICD-10-CM | POA: Diagnosis not present

## 2024-06-18 DIAGNOSIS — H04123 Dry eye syndrome of bilateral lacrimal glands: Secondary | ICD-10-CM | POA: Diagnosis not present

## 2024-06-18 DIAGNOSIS — E119 Type 2 diabetes mellitus without complications: Secondary | ICD-10-CM | POA: Diagnosis not present

## 2024-06-18 DIAGNOSIS — H52203 Unspecified astigmatism, bilateral: Secondary | ICD-10-CM | POA: Diagnosis not present

## 2024-06-20 DIAGNOSIS — M9903 Segmental and somatic dysfunction of lumbar region: Secondary | ICD-10-CM | POA: Diagnosis not present

## 2024-06-20 DIAGNOSIS — M9901 Segmental and somatic dysfunction of cervical region: Secondary | ICD-10-CM | POA: Diagnosis not present

## 2024-06-20 DIAGNOSIS — M9904 Segmental and somatic dysfunction of sacral region: Secondary | ICD-10-CM | POA: Diagnosis not present

## 2024-06-20 DIAGNOSIS — M9902 Segmental and somatic dysfunction of thoracic region: Secondary | ICD-10-CM | POA: Diagnosis not present

## 2024-06-20 DIAGNOSIS — M546 Pain in thoracic spine: Secondary | ICD-10-CM | POA: Diagnosis not present

## 2024-06-24 DIAGNOSIS — M9902 Segmental and somatic dysfunction of thoracic region: Secondary | ICD-10-CM | POA: Diagnosis not present

## 2024-06-24 DIAGNOSIS — M9904 Segmental and somatic dysfunction of sacral region: Secondary | ICD-10-CM | POA: Diagnosis not present

## 2024-06-24 DIAGNOSIS — M546 Pain in thoracic spine: Secondary | ICD-10-CM | POA: Diagnosis not present

## 2024-06-24 DIAGNOSIS — M9901 Segmental and somatic dysfunction of cervical region: Secondary | ICD-10-CM | POA: Diagnosis not present

## 2024-06-24 DIAGNOSIS — M9903 Segmental and somatic dysfunction of lumbar region: Secondary | ICD-10-CM | POA: Diagnosis not present

## 2024-06-27 DIAGNOSIS — M9903 Segmental and somatic dysfunction of lumbar region: Secondary | ICD-10-CM | POA: Diagnosis not present

## 2024-06-27 DIAGNOSIS — M9902 Segmental and somatic dysfunction of thoracic region: Secondary | ICD-10-CM | POA: Diagnosis not present

## 2024-06-27 DIAGNOSIS — M9901 Segmental and somatic dysfunction of cervical region: Secondary | ICD-10-CM | POA: Diagnosis not present

## 2024-06-27 DIAGNOSIS — M546 Pain in thoracic spine: Secondary | ICD-10-CM | POA: Diagnosis not present

## 2024-06-27 DIAGNOSIS — M9904 Segmental and somatic dysfunction of sacral region: Secondary | ICD-10-CM | POA: Diagnosis not present

## 2024-07-01 DIAGNOSIS — M546 Pain in thoracic spine: Secondary | ICD-10-CM | POA: Diagnosis not present

## 2024-07-01 DIAGNOSIS — M9903 Segmental and somatic dysfunction of lumbar region: Secondary | ICD-10-CM | POA: Diagnosis not present

## 2024-07-01 DIAGNOSIS — M9902 Segmental and somatic dysfunction of thoracic region: Secondary | ICD-10-CM | POA: Diagnosis not present

## 2024-07-01 DIAGNOSIS — M9901 Segmental and somatic dysfunction of cervical region: Secondary | ICD-10-CM | POA: Diagnosis not present

## 2024-07-01 DIAGNOSIS — M9904 Segmental and somatic dysfunction of sacral region: Secondary | ICD-10-CM | POA: Diagnosis not present

## 2024-07-04 DIAGNOSIS — M9904 Segmental and somatic dysfunction of sacral region: Secondary | ICD-10-CM | POA: Diagnosis not present

## 2024-07-04 DIAGNOSIS — M9902 Segmental and somatic dysfunction of thoracic region: Secondary | ICD-10-CM | POA: Diagnosis not present

## 2024-07-04 DIAGNOSIS — M546 Pain in thoracic spine: Secondary | ICD-10-CM | POA: Diagnosis not present

## 2024-07-04 DIAGNOSIS — M9903 Segmental and somatic dysfunction of lumbar region: Secondary | ICD-10-CM | POA: Diagnosis not present

## 2024-07-04 DIAGNOSIS — M9901 Segmental and somatic dysfunction of cervical region: Secondary | ICD-10-CM | POA: Diagnosis not present

## 2024-07-24 DIAGNOSIS — G4733 Obstructive sleep apnea (adult) (pediatric): Secondary | ICD-10-CM | POA: Diagnosis not present

## 2024-07-29 DIAGNOSIS — M546 Pain in thoracic spine: Secondary | ICD-10-CM | POA: Diagnosis not present

## 2024-07-29 DIAGNOSIS — M9902 Segmental and somatic dysfunction of thoracic region: Secondary | ICD-10-CM | POA: Diagnosis not present

## 2024-07-29 DIAGNOSIS — M9903 Segmental and somatic dysfunction of lumbar region: Secondary | ICD-10-CM | POA: Diagnosis not present

## 2024-07-29 DIAGNOSIS — M9904 Segmental and somatic dysfunction of sacral region: Secondary | ICD-10-CM | POA: Diagnosis not present

## 2024-07-29 DIAGNOSIS — M9901 Segmental and somatic dysfunction of cervical region: Secondary | ICD-10-CM | POA: Diagnosis not present

## 2024-09-02 ENCOUNTER — Ambulatory Visit (INDEPENDENT_AMBULATORY_CARE_PROVIDER_SITE_OTHER): Admitting: *Deleted

## 2024-09-02 VITALS — BP 127/59 | HR 88 | Temp 97.5°F | Resp 16 | Ht 63.0 in | Wt 180.6 lb

## 2024-09-02 DIAGNOSIS — I2510 Atherosclerotic heart disease of native coronary artery without angina pectoris: Secondary | ICD-10-CM | POA: Diagnosis not present

## 2024-09-02 DIAGNOSIS — I6523 Occlusion and stenosis of bilateral carotid arteries: Secondary | ICD-10-CM

## 2024-09-02 DIAGNOSIS — E785 Hyperlipidemia, unspecified: Secondary | ICD-10-CM

## 2024-09-02 MED ORDER — INCLISIRAN SODIUM 284 MG/1.5ML ~~LOC~~ SOSY
284.0000 mg | PREFILLED_SYRINGE | Freq: Once | SUBCUTANEOUS | Status: AC
  Administered 2024-09-02: 284 mg via SUBCUTANEOUS
  Filled 2024-09-02: qty 1.5

## 2024-09-02 NOTE — Progress Notes (Signed)
 Diagnosis: Hyperlipidemia  Provider:  Mannam, Praveen MD  Procedure: Injection  Leqvio  (inclisiran), Dose: 284 mg, Site: subcutaneous, Number of injections: 1  Injection Site(s): Right upper quad. abdomen  Post Care: Observation period completed  Discharge: Condition: Good, Destination: Home . AVS Declined  Performed by:  Trudy Lamarr LABOR, RN

## 2024-09-20 ENCOUNTER — Telehealth: Payer: Self-pay | Admitting: Pharmacy Technician

## 2024-09-20 NOTE — Telephone Encounter (Signed)
 Auth Submission: NO AUTH NEEDED Site of care: Site of care: [facility] Payer: [employer] Medication & CPT/J Code(s) submitted: Leqvio  (Inclisiran) J1306 Diagnosis Code: E78.5 Route of submission (phone, fax, portal):  Phone # Fax # Auth type: Buy/Bill PB Units/visits requested: X2 DOSES Reference number:  Approval from: 09/20/24 to 09/04/25

## 2025-03-04 ENCOUNTER — Ambulatory Visit
